# Patient Record
Sex: Male | Born: 1960 | Race: White | Hispanic: No | Marital: Single | Smoking: Former smoker
Health system: Southern US, Community
[De-identification: ages and names within clinical notes are randomized; demographics above are authoritative.]

## PROBLEM LIST (undated history)

## (undated) DIAGNOSIS — F1020 Alcohol dependence, uncomplicated: Secondary | ICD-10-CM

## (undated) DIAGNOSIS — Z87442 Personal history of urinary calculi: Secondary | ICD-10-CM

## (undated) DIAGNOSIS — J45909 Unspecified asthma, uncomplicated: Secondary | ICD-10-CM

## (undated) DIAGNOSIS — F32A Depression, unspecified: Secondary | ICD-10-CM

## (undated) DIAGNOSIS — F419 Anxiety disorder, unspecified: Secondary | ICD-10-CM

## (undated) DIAGNOSIS — N3289 Other specified disorders of bladder: Secondary | ICD-10-CM

## (undated) DIAGNOSIS — T7840XA Allergy, unspecified, initial encounter: Secondary | ICD-10-CM

## (undated) HISTORY — PX: KIDNEY STONE SURGERY: SHX686

## (undated) HISTORY — PX: NASAL SEPTUM SURGERY: SHX37

## (undated) HISTORY — DX: Unspecified asthma, uncomplicated: J45.909

## (undated) HISTORY — DX: Depression, unspecified: F32.A

## (undated) HISTORY — DX: Allergy, unspecified, initial encounter: T78.40XA

## (undated) HISTORY — DX: Anxiety disorder, unspecified: F41.9

## (undated) HISTORY — DX: Other specified disorders of bladder: N32.89

## (undated) HISTORY — PX: HEMORRHOID SURGERY: SHX153

---

## 2012-06-27 ENCOUNTER — Encounter (HOSPITAL_COMMUNITY): Payer: Self-pay | Admitting: *Deleted

## 2012-06-27 ENCOUNTER — Emergency Department (HOSPITAL_COMMUNITY)
Admission: EM | Admit: 2012-06-27 | Discharge: 2012-06-27 | Disposition: A | Discharge status: Home / Self Care | Payer: BC Managed Care – PPO | Attending: Emergency Medicine | Admitting: Emergency Medicine

## 2012-06-27 ENCOUNTER — Emergency Department (HOSPITAL_COMMUNITY): Payer: BC Managed Care – PPO

## 2012-06-27 DIAGNOSIS — N200 Calculus of kidney: Secondary | ICD-10-CM | POA: Insufficient documentation

## 2012-06-27 DIAGNOSIS — Z79899 Other long term (current) drug therapy: Secondary | ICD-10-CM | POA: Insufficient documentation

## 2012-06-27 DIAGNOSIS — R310 Gross hematuria: Secondary | ICD-10-CM | POA: Insufficient documentation

## 2012-06-27 DIAGNOSIS — F1020 Alcohol dependence, uncomplicated: Secondary | ICD-10-CM | POA: Insufficient documentation

## 2012-06-27 HISTORY — DX: Alcohol dependence, uncomplicated: F10.20

## 2012-06-27 LAB — URINE MICROSCOPIC-ADD ON

## 2012-06-27 LAB — CBC WITH DIFFERENTIAL/PLATELET
Eosinophils Absolute: 0.2 10*3/uL (ref 0.0–0.7)
Eosinophils Relative: 2 % (ref 0–5)
HCT: 42.9 % (ref 39.0–52.0)
Hemoglobin: 15 g/dL (ref 13.0–17.0)
Lymphs Abs: 1.8 10*3/uL (ref 0.7–4.0)
MCH: 30.6 pg (ref 26.0–34.0)
MCHC: 35 g/dL (ref 30.0–36.0)
MCV: 87.6 fL (ref 78.0–100.0)
Monocytes Absolute: 0.7 10*3/uL (ref 0.1–1.0)
Monocytes Relative: 9 % (ref 3–12)
Neutrophils Relative %: 63 % (ref 43–77)
RBC: 4.9 MIL/uL (ref 4.22–5.81)

## 2012-06-27 LAB — URINALYSIS, ROUTINE W REFLEX MICROSCOPIC
Bilirubin Urine: NEGATIVE
Glucose, UA: NEGATIVE mg/dL
Ketones, ur: 15 mg/dL — AB
Specific Gravity, Urine: 1.022 (ref 1.005–1.030)
pH: 5.5 (ref 5.0–8.0)

## 2012-06-27 LAB — COMPREHENSIVE METABOLIC PANEL
Alkaline Phosphatase: 73 U/L (ref 39–117)
BUN: 22 mg/dL (ref 6–23)
Creatinine, Ser: 0.91 mg/dL (ref 0.50–1.35)
GFR calc Af Amer: >90 mL/min (ref >90)
Glucose, Bld: 84 mg/dL (ref 70–99)
Potassium: 3.3 mEq/L — ABNORMAL LOW (ref 3.5–5.1)
Total Bilirubin: 0.4 mg/dL (ref 0.3–1.2)
Total Protein: 6.8 g/dL (ref 6.0–8.3)

## 2012-06-27 MED ORDER — NAPROXEN 375 MG PO TABS: 375.0000 mg | ORAL_TABLET | Freq: Two times a day (BID) | ORAL | Status: DC

## 2012-06-27 MED ORDER — HYDROCODONE-ACETAMINOPHEN 5-325 MG PO TABS: 1.0000 | ORAL_TABLET | Freq: Four times a day (QID) | ORAL | Status: DC | PRN

## 2012-06-27 NOTE — ED Notes (Signed)
The pt has had lt flank pain  Since 2100 and when he went home he saw blood in his urine.  No previous history

## 2012-06-27 NOTE — ED Notes (Signed)
Pt transported to CT ?

## 2012-06-27 NOTE — ED Provider Notes (Signed)
History     CSN: [id number]  Arrival date & time 06/27/12  0024   First MD Initiated Contact with Patient 06/27/12 0155      Chief Complaint  Patient presents with  . Flank Pain    (Consider location/radiation/quality/duration/timing/severity/associated sxs/prior treatment) HPI Comments: Wayne Silva is a 52 y.o. male with a history of substance abuse (alcohol) presents emergency department complaining of left-sided flank pain and suprapubic abdominal pressure onset at 830 pm this evening.  Other associated symptoms include gross hematuria that occurred around 9 this evening.  Patient is unable to characterize the pain and states that was previously rated at a 6/10 and is currently rated at a 2/10.  The pain was unrelieved by Gas-X and time this as patient believed to be etiology to be gas.  No known exacerbating factors.  Flank pain and abdominal pain were felt as 2 separate pains and did not feel as though one radiated towards the other.  Patient denies any fevers, night sweats, chills, dysuria, urinary frequency, urinary hesitancy, penile discharge, change in bowel movements, nausea or vomiting.  Patient is a 52 y.o. male presenting with flank pain. The history is provided by the patient.  Flank Pain Associated symptoms include abdominal pain. Pertinent negatives include no chest pain, chills, coughing, diaphoresis, fatigue, fever, headaches, nausea, rash or vomiting.    Past Medical History  Diagnosis Date  . Alcoholic     History reviewed. No pertinent past surgical history.  No family history on file.  History  Substance Use Topics  . Smoking status: Never Smoker   . Smokeless tobacco: Not on file  . Alcohol Use: Yes      Review of Systems  Constitutional: Negative for fever, chills, diaphoresis and fatigue.  HENT: Negative for ear pain and sinus pressure.   Eyes: Negative for visual disturbance.  Respiratory: Negative for cough.   Cardiovascular: Negative for chest  pain.  Gastrointestinal: Positive for abdominal pain. Negative for nausea and vomiting.  Genitourinary: Positive for flank pain. Negative for dysuria, urgency and difficulty urinating.  Skin: Negative for color change and rash.  Neurological: Negative for dizziness and headaches.  Psychiatric/Behavioral: Negative for confusion.  All other systems reviewed and are negative.    Allergies  Review of patient's allergies indicates no known allergies.  Home Medications   Current Outpatient Rx  Name  Route  Sig  Dispense  Refill  . acetaminophen (TYLENOL) 325 MG tablet   Oral   Take 650 mg by mouth every 6 (six) hours as needed for pain.         Marland Kitchen ALPRAZolam (XANAX) 0.25 MG tablet   Oral   Take 0.25 mg by mouth 3 (three) times daily.         . Multiple Vitamin (MULTIVITAMIN WITH MINERALS) TABS   Oral   Take 1 tablet by mouth daily.         . valACYclovir (VALTREX) 500 MG tablet   Oral   Take 500 mg by mouth 2 (two) times daily as needed (for cold sore).           BP 118/79  Pulse 64  Temp(Src) 97.5 F (36.4 C) (Oral)  Resp 18  SpO2 99%  Physical Exam  Nursing note and vitals reviewed. Constitutional: He is oriented to person, place, and time. He appears well-developed and well-nourished. He appears distressed.  HENT:  Head: Normocephalic and atraumatic.  Eyes: Conjunctivae and EOM are normal.  Neck: Normal range of motion. Neck supple.  Cardiovascular: Normal rate, regular rhythm and normal heart sounds.   Pulmonary/Chest: Effort normal.  Abdominal: Soft. Normal appearance and bowel sounds are normal. He exhibits no distension, no ascites and no pulsatile midline mass. There is CVA tenderness (mild left sided).  Soft non tender abdomen with no peritone andal signs. No masses.   Musculoskeletal: Normal range of motion.  Neurological: He is alert and oriented to person, place, and time.  Skin: Skin is warm and dry. He is not diaphoretic.  Psychiatric: He has a  normal mood and affect. His behavior is normal.    ED Course  Procedures (including critical care time)  Labs Reviewed  URINALYSIS, ROUTINE W REFLEX MICROSCOPIC - Abnormal; Notable for the following:    Color, Urine RED (*)    APPearance CLOUDY (*)    Hgb urine dipstick LARGE (*)    Ketones, ur 15 (*)    Protein, ur 30 (*)    Leukocytes, UA SMALL (*)    All other components within normal limits  CBC WITH DIFFERENTIAL - Abnormal; Notable for the following:    Platelets 149 (*)    All other components within normal limits  COMPREHENSIVE METABOLIC PANEL - Abnormal; Notable for the following:    Potassium 3.3 (*)    All other components within normal limits  URINE MICROSCOPIC-ADD ON - Abnormal; Notable for the following:    Squamous Epithelial / LPF FEW (*)    Bacteria, UA FEW (*)    All other components within normal limits  URINE CULTURE   Ct Abdomen Pelvis Wo Contrast  06/27/2012  *RADIOLOGY REPORT*  Clinical Data: Lower abdominal pain and left flank pain. Hematuria.  CT ABDOMEN AND PELVIS WITHOUT CONTRAST  Technique:  Multidetector CT imaging of the abdomen and pelvis was performed following the standard protocol without intravenous contrast.  Comparison: None.  Findings: The visualized lung bases are clear.  The liver and spleen are unremarkable in appearance.  The gallbladder is within normal limits.  The pancreas and adrenal glands are unremarkable.  Minimal left-sided hydronephrosis is noted, with prominence of the proximal left ureter, and an obstructing 5 x 4 mm stone in the proximal left ureter, just below the left renal pelvis. The right kidney is unremarkable in appearance.  No nonobstructing renal stones are identified.  No significant perinephric stranding is seen.  No free fluid is identified.  The small bowel is unremarkable in appearance.  The stomach is within normal limits.  No acute vascular abnormalities are seen.  The appendix is normal in caliber, without evidence for  appendicitis.  The colon is unremarkable in appearance.  The bladder is mildly distended and grossly unremarkable.  The prostate remains normal in size, with minimal calcification.  No inguinal lymphadenopathy is seen.  No acute osseous abnormalities are identified.  There is mild narrowing of the intervertebral disc space at L2-L3, without evidence of significant disc protrusion.  IMPRESSION: Minimal left-sided hydronephrosis, with an obstructing 5 x 4 mm stone in the proximal left ureter, just below the left renal pelvis.   Original Report Authenticated By: Tonia Ghent, M.D.      No diagnosis found.    MDM  Left kidney stone, hematuria  Pt has been diagnosed with a Kidney Stone via CT. There is no evidence of significant hydronephrosis, serum creatine WNL, vitals sign stable and the pt does not have irratractable vomiting. Pt will be dc home with pain medications & has been advised to follow up with PCP.  Jaci Carrel, [place] 06/27/12 0430

## 2012-06-27 NOTE — ED Notes (Signed)
Pt began having lower abdominal pain this evening, took gas-x with little relief.  Upon urination pt noted his urine was blood tinged and dark in color,  Also complaining of left flank pain.  Denies any kidney stones in the past.  Denies N/V.

## 2012-06-27 NOTE — ED Provider Notes (Signed)
Medical screening examination/treatment/procedure(s) were performed by non-physician practitioner and as supervising physician I was immediately available for consultation/collaboration.   Lyanne Co, MD 06/27/12 [phone]

## 2012-06-28 LAB — URINE CULTURE: Colony Count: NO GROWTH

## 2012-12-01 LAB — HM COLONOSCOPY: HM Colonoscopy: NORMAL

## 2013-03-02 ENCOUNTER — Ambulatory Visit (INDEPENDENT_AMBULATORY_CARE_PROVIDER_SITE_OTHER): Payer: BC Managed Care – PPO | Admitting: Internal Medicine

## 2013-03-02 VITALS — BP 128/68 | HR 64 | Temp 97.8°F | Resp 16 | Wt 140.8 lb

## 2013-03-02 DIAGNOSIS — Z23 Encounter for immunization: Secondary | ICD-10-CM

## 2013-03-02 DIAGNOSIS — F411 Generalized anxiety disorder: Secondary | ICD-10-CM

## 2013-03-02 MED ORDER — SERTRALINE HCL 25 MG PO TABS: 25.0000 mg | ORAL_TABLET | Freq: Every day | ORAL | Status: DC

## 2013-03-02 NOTE — Progress Notes (Signed)
Subjective:    Patient ID: Wayne Silva, male    DOB: 04-05-1961, 52 y.o.   MRN: [id number]  HPI Comments: New to me he was sent here by his therapist Lorenda Cahill. 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 His current dose of xanax is down to 0.125 mg once a day. He feels like he still needs the xanax to some degree because he has fits of anger (? Rage), anxiety, irritability, and insomnia.     Review of Systems  Constitutional: Negative.  Negative for fever, chills, diaphoresis, appetite change and fatigue.  HENT: Negative.   Eyes: Negative.   Respiratory: Negative.  Negative for apnea, cough, choking, chest tightness, shortness of breath, wheezing and stridor.   Cardiovascular: Negative.  Negative for chest pain, palpitations and leg swelling.  Gastrointestinal: Negative.  Negative for nausea, vomiting, abdominal pain, diarrhea and constipation.  Endocrine: Negative.   Genitourinary: Negative.   Musculoskeletal: Negative.   Skin: Negative.   Allergic/Immunologic: Negative.   Neurological: Negative.  Negative for dizziness, tremors, seizures, syncope, facial asymmetry, speech difficulty, weakness, light-headedness, numbness and headaches.  Hematological: Negative.  Negative for adenopathy. Does not bruise/bleed easily.  Psychiatric/Behavioral: Positive for sleep disturbance, dysphoric mood and decreased concentration. Negative for suicidal ideas, hallucinations, behavioral problems, confusion, self-injury and agitation. The patient is nervous/anxious. The patient is not hyperactive.        Objective:   Physical Exam  Vitals reviewed. Constitutional: He is oriented to person, place, and time. He appears well-developed and well-nourished. No distress.  HENT:  Head: Normocephalic and atraumatic.  Mouth/Throat: Oropharynx is clear and moist. No oropharyngeal exudate.  Eyes: Conjunctivae  are normal. Right eye exhibits no discharge. Left eye exhibits no discharge. No scleral icterus.  Neck: Normal range of motion. Neck supple. No JVD present. No tracheal deviation present. No thyromegaly present.  Cardiovascular: Normal rate, regular rhythm, normal heart sounds and intact distal pulses.  Exam reveals no gallop and no friction rub.   No murmur heard. Pulmonary/Chest: Effort normal and breath sounds normal. No stridor. No respiratory distress. He has no wheezes. He has no rales. He exhibits no tenderness.  Abdominal: Soft. Bowel sounds are normal. He exhibits no distension and no mass. There is no tenderness. There is no rebound and no guarding.  Musculoskeletal: Normal range of motion. He exhibits no edema and no tenderness.  Lymphadenopathy:    He has no cervical adenopathy.  Neurological: He is oriented to person, place, and time.  Skin: Skin is warm and dry. No rash noted. He is not diaphoretic. No erythema. No pallor.  Psychiatric: His behavior is normal. Judgment and thought content normal. His mood appears anxious. His affect is not angry, not blunt, not labile and not inappropriate. His speech is not rapid and/or pressured, not delayed, not tangential and not slurred. He is not agitated, not aggressive, not hyperactive, not slowed, not withdrawn, not actively hallucinating and not combative. Thought content is not paranoid and not delusional. Cognition and memory are normal. He does not exhibit a depressed mood. He expresses no homicidal and no suicidal ideation. He expresses no suicidal plans and no homicidal plans. He is communicative. He is attentive.     Lab Results  Component Value Date   WBC 7.3 06/27/2012   HGB 15.0 06/27/2012   HCT 42.9 06/27/2012   PLT 149* 06/27/2012   GLUCOSE 84 06/27/2012   ALT  14 06/27/2012   AST 17 06/27/2012   NA 137 06/27/2012   K 3.3* 06/27/2012   CL 102 06/27/2012   CREATININE 0.91 06/27/2012   BUN 22 06/27/2012   CO2 30 06/27/2012        Assessment & Plan:

## 2013-03-02 NOTE — Patient Instructions (Signed)

## 2013-03-07 ENCOUNTER — Encounter: Payer: Self-pay | Admitting: Internal Medicine

## 2013-03-07 NOTE — Assessment & Plan Note (Addendum)
Despite his concerns there is no evidence that he is withdrawing off of the xanax based on his exam today I told him that I think he is ready to stop the xanax all together I am concerned that he may have GAD, depression, OCD so I have asked him to start sertraline He saw a psych recently and was placed on buspar but that has not helped much He will cont intensive therapy with Lorenda Cahill

## 2013-08-26 ENCOUNTER — Encounter: Payer: Self-pay | Admitting: Internal Medicine

## 2013-08-26 ENCOUNTER — Ambulatory Visit (INDEPENDENT_AMBULATORY_CARE_PROVIDER_SITE_OTHER): Payer: BC Managed Care – PPO | Admitting: Internal Medicine

## 2013-08-26 ENCOUNTER — Other Ambulatory Visit (INDEPENDENT_AMBULATORY_CARE_PROVIDER_SITE_OTHER): Payer: BC Managed Care – PPO

## 2013-08-26 VITALS — BP 110/68 | HR 65 | Temp 97.5°F | Resp 16 | Ht 67.0 in | Wt 133.0 lb

## 2013-08-26 DIAGNOSIS — Z0000 Encounter for general adult medical examination without abnormal findings: Secondary | ICD-10-CM | POA: Insufficient documentation

## 2013-08-26 DIAGNOSIS — N3010 Interstitial cystitis (chronic) without hematuria: Secondary | ICD-10-CM

## 2013-08-26 LAB — COMPREHENSIVE METABOLIC PANEL
ALK PHOS: 63 U/L (ref 39–117)
ALT: 42 U/L (ref 0–53)
AST: 26 U/L (ref 0–37)
Albumin: 4.1 g/dL (ref 3.5–5.2)
BILIRUBIN TOTAL: 0.8 mg/dL (ref 0.3–1.2)
BUN: 22 mg/dL (ref 6–23)
CO2: 30 mEq/L (ref 19–32)
CREATININE: 0.9 mg/dL (ref 0.4–1.5)
Calcium: 9.3 mg/dL (ref 8.4–10.5)
Chloride: 102 mEq/L (ref 96–112)
GFR: 95.38 mL/min (ref >60.00)
GLUCOSE: 57 mg/dL — AB (ref 70–99)
POTASSIUM: 4.3 meq/L (ref 3.5–5.1)
Sodium: 138 mEq/L (ref 135–145)
Total Protein: 6.7 g/dL (ref 6.0–8.3)

## 2013-08-26 LAB — CBC WITH DIFFERENTIAL/PLATELET
BASOS PCT: 0.6 % (ref 0.0–3.0)
Basophils Absolute: 0 10*3/uL (ref 0.0–0.1)
EOS ABS: 0.2 10*3/uL (ref 0.0–0.7)
EOS PCT: 2.4 % (ref 0.0–5.0)
HEMATOCRIT: 44.9 % (ref 39.0–52.0)
HEMOGLOBIN: 15.2 g/dL (ref 13.0–17.0)
Lymphocytes Relative: 15.5 % (ref 12.0–46.0)
Lymphs Abs: 1.1 10*3/uL (ref 0.7–4.0)
MCHC: 33.9 g/dL (ref 30.0–36.0)
MCV: 89.8 fl (ref 78.0–100.0)
MONO ABS: 0.6 10*3/uL (ref 0.1–1.0)
Monocytes Relative: 8.1 % (ref 3.0–12.0)
NEUTROS ABS: 5.2 10*3/uL (ref 1.4–7.7)
NEUTROS PCT: 73.4 % (ref 43.0–77.0)
Platelets: 179 10*3/uL (ref 150.0–400.0)
RBC: 4.99 Mil/uL (ref 4.22–5.81)
RDW: 13.9 % (ref 11.5–14.6)
WBC: 7 10*3/uL (ref 4.5–10.5)

## 2013-08-26 LAB — LIPID PANEL
CHOLESTEROL: 166 mg/dL (ref 0–200)
HDL: 64.7 mg/dL (ref >39.00)
LDL CALC: 86 mg/dL (ref 0–99)
TRIGLYCERIDES: 77 mg/dL (ref 0.0–149.0)
Total CHOL/HDL Ratio: 3
VLDL: 15.4 mg/dL (ref 0.0–40.0)

## 2013-08-26 LAB — FECAL OCCULT BLOOD, GUAIAC: FECAL OCCULT BLD: NEGATIVE

## 2013-08-26 LAB — TSH: TSH: 1.62 u[IU]/mL (ref 0.35–5.50)

## 2013-08-26 LAB — PSA: PSA: 1.13 ng/mL (ref 0.10–4.00)

## 2013-08-26 NOTE — Patient Instructions (Signed)

## 2013-08-26 NOTE — Progress Notes (Signed)
Pre visit review using our clinic review tool, if applicable. No additional management support is needed unless otherwise documented below in the visit note. 

## 2013-08-26 NOTE — Assessment & Plan Note (Signed)
Exam done Labs ordered Vaccines were reviewed Pt ed material was given 

## 2013-08-26 NOTE — Progress Notes (Signed)
Subjective:    Patient ID: Wayne SchmidtJohn Silva, male    DOB: 11-Mar-1961, 53 y.o.   MRN: [id number]  HPI Comments: He returns for a physical and he tells me that he feels well and offer no complaints. He has been seeing a urologist in [place] for the treatment of IC.     Review of Systems  Constitutional: Negative.  Negative for fever, chills, diaphoresis, appetite change and fatigue.  HENT: Negative.   Eyes: Negative.   Respiratory: Negative.  Negative for cough, choking, chest tightness, shortness of breath and stridor.   Cardiovascular: Negative.  Negative for chest pain, palpitations and leg swelling.  Gastrointestinal: Negative.  Negative for abdominal pain.  Endocrine: Negative.   Genitourinary: Negative.  Negative for difficulty urinating.  Musculoskeletal: Negative.   Skin: Negative.   Allergic/Immunologic: Negative.   Neurological: Negative.   Hematological: Negative.  Negative for adenopathy. Does not bruise/bleed easily.  Psychiatric/Behavioral: Negative for suicidal ideas, sleep disturbance, self-injury, dysphoric mood and agitation. The patient is nervous/anxious.   All other systems reviewed and are negative.      Objective:   Physical Exam  Vitals reviewed. Constitutional: He is oriented to person, place, and time. He appears well-developed and well-nourished. No distress.  HENT:  Head: Normocephalic and atraumatic.  Mouth/Throat: Oropharynx is clear and moist. No oropharyngeal exudate.  Eyes: Conjunctivae are normal. Right eye exhibits no discharge. Left eye exhibits no discharge. No scleral icterus.  Neck: Normal range of motion. Neck supple. No JVD present. No tracheal deviation present. No thyromegaly present.  Cardiovascular: Normal rate, regular rhythm, normal heart sounds and intact distal pulses.  Exam reveals no gallop and no friction rub.   No murmur heard. Pulmonary/Chest: Effort normal and breath sounds normal. No stridor. No respiratory distress. He has no  wheezes. He has no rales. He exhibits no tenderness.  Abdominal: Soft. Bowel sounds are normal. He exhibits no distension and no mass. There is no tenderness. There is no rebound and no guarding. Hernia confirmed negative in the right inguinal area and confirmed negative in the left inguinal area.  Genitourinary: Rectum normal, prostate normal, testes normal and penis normal. Rectal exam shows no external hemorrhoid, no internal hemorrhoid, no fissure, no mass, no tenderness and anal tone normal. Guaiac negative stool. Prostate is not enlarged and not tender. Right testis shows no mass, no swelling and no tenderness. Right testis is descended. Left testis shows no mass, no swelling and no tenderness. Left testis is descended. Circumcised. No penile erythema or penile tenderness. No discharge found.  Musculoskeletal: Normal range of motion. He exhibits no edema and no tenderness.  Lymphadenopathy:    He has no cervical adenopathy.       Right: No inguinal adenopathy present.       Left: No inguinal adenopathy present.  Neurological: He is oriented to person, place, and time.  Skin: Skin is warm and dry. No rash noted. He is not diaphoretic. No erythema. No pallor.  Psychiatric: He has a normal mood and affect. His behavior is normal. Judgment and thought content normal.     Lab Results  Component Value Date   WBC 7.3 06/27/2012   HGB 15.0 06/27/2012   HCT 42.9 06/27/2012   PLT 149* 06/27/2012   GLUCOSE 84 06/27/2012   ALT 14 06/27/2012   AST 17 06/27/2012   NA 137 06/27/2012   K 3.3* 06/27/2012   CL 102 06/27/2012   CREATININE 0.91 06/27/2012   BUN 22 06/27/2012   CO2  30 06/27/2012       Assessment & Plan:

## 2014-01-18 ENCOUNTER — Other Ambulatory Visit: Payer: Self-pay | Admitting: Internal Medicine

## 2014-05-01 ENCOUNTER — Encounter: Payer: Self-pay | Admitting: Internal Medicine

## 2014-05-01 ENCOUNTER — Ambulatory Visit (INDEPENDENT_AMBULATORY_CARE_PROVIDER_SITE_OTHER)
Admission: RE | Admit: 2014-05-01 | Discharge: 2014-05-01 | Disposition: A | Discharge status: Home / Self Care | Payer: BC Managed Care – PPO | Source: Ambulatory Visit | Attending: Internal Medicine | Admitting: Internal Medicine

## 2014-05-01 ENCOUNTER — Ambulatory Visit (INDEPENDENT_AMBULATORY_CARE_PROVIDER_SITE_OTHER): Payer: BC Managed Care – PPO | Admitting: Internal Medicine

## 2014-05-01 VITALS — BP 110/82 | HR 52 | Temp 97.5°F | Resp 16 | Ht 67.0 in | Wt 133.0 lb

## 2014-05-01 DIAGNOSIS — R059 Cough, unspecified: Secondary | ICD-10-CM

## 2014-05-01 DIAGNOSIS — R05 Cough: Secondary | ICD-10-CM

## 2014-05-01 DIAGNOSIS — J45991 Cough variant asthma: Secondary | ICD-10-CM | POA: Insufficient documentation

## 2014-05-01 DIAGNOSIS — G3184 Mild cognitive impairment, so stated: Secondary | ICD-10-CM | POA: Insufficient documentation

## 2014-05-01 DIAGNOSIS — F411 Generalized anxiety disorder: Secondary | ICD-10-CM

## 2014-05-01 MED ORDER — SERTRALINE HCL 25 MG PO TABS: 25.0000 mg | ORAL_TABLET | Freq: Every day | ORAL | Status: DC

## 2014-05-01 MED ORDER — PHOSPHATIDYLSERINE-DHA-EPA 100-19.5-6.5 MG PO CAPS: 1.0000 | ORAL_CAPSULE | Freq: Every day | ORAL | Status: DC

## 2014-05-01 MED ORDER — MONTELUKAST SODIUM 10 MG PO TABS: 10.0000 mg | ORAL_TABLET | Freq: Every day | ORAL | Status: DC

## 2014-05-01 MED ORDER — MOMETASONE FUROATE 100 MCG/ACT IN AERO: 1.0000 | INHALATION_SPRAY | Freq: Two times a day (BID) | RESPIRATORY_TRACT | Status: DC

## 2014-05-01 NOTE — Assessment & Plan Note (Signed)
He has tried several OTC products for this with no success and some side effects Will start vayacog

## 2014-05-01 NOTE — Assessment & Plan Note (Signed)
His exam and CXR are normal Will treat for asthma, may consider PFT's and/or a CT scan if he does not improve much with this

## 2014-05-01 NOTE — Assessment & Plan Note (Signed)
He does not want to make an increase in the dose, will cont the same for now

## 2014-05-01 NOTE — Patient Instructions (Signed)
Cough, Adult  A cough is a reflex that helps clear your throat and airways. It can help heal the body or may be a reaction to an irritated airway. A cough may only last 2 or 3 weeks (acute) or may last more than 8 weeks (chronic).  CAUSES Acute cough:  Viral or bacterial infections. Chronic cough:  Infections.  Allergies.  Asthma.  Post-nasal drip.  Smoking.  Heartburn or acid reflux.  Some medicines.  Chronic lung problems (COPD).  Cancer. SYMPTOMS   Cough.  Fever.  Chest pain.  Increased breathing rate.  High-pitched whistling sound when breathing (wheezing).  Colored mucus that you cough up (sputum). TREATMENT   A bacterial cough may be treated with antibiotic medicine.  A viral cough must run its course and will not respond to antibiotics.  Your caregiver may recommend other treatments if you have a chronic cough. HOME CARE INSTRUCTIONS   Only take over-the-counter or prescription medicines for pain, discomfort, or fever as directed by your caregiver. Use cough suppressants only as directed by your caregiver.  Use a cold steam vaporizer or humidifier in your bedroom or home to help loosen secretions.  Sleep in a semi-upright position if your cough is worse at night.  Rest as needed.  Stop smoking if you smoke. SEEK IMMEDIATE MEDICAL CARE IF:   You have pus in your sputum.  Your cough starts to worsen.  You cannot control your cough with suppressants and are losing sleep.  You begin coughing up blood.  You have difficulty breathing.  You develop pain which is getting worse or is uncontrolled with medicine.  You have a fever. MAKE SURE YOU:   Understand these instructions.  Will watch your condition.  Will get help right away if you are not doing well or get worse. Document Released: 10/25/2010 Document Revised: 07/21/2011 Document Reviewed: 10/25/2010 [employer] Patient Information 2015 [employer]. This information is not intended  to replace advice given to you by your health care provider. Make sure you discuss any questions you have with your health care provider.  

## 2014-05-01 NOTE — Assessment & Plan Note (Signed)
Will try singulair and asmanex for this

## 2014-05-01 NOTE — Progress Notes (Signed)
Subjective:    Patient ID: Wayne Silva, male    DOB: [birth year], 53 y.o.   MRN: [id number]  Cough This is a chronic problem. The current episode started more than 1 month ago (for 3 months). The problem has been unchanged. The cough is non-productive. Associated symptoms include postnasal drip and rhinorrhea. Pertinent negatives include no chest pain, chills, ear congestion, ear pain, fever, heartburn, hemoptysis, myalgias, nasal congestion, rash, sore throat, shortness of breath, sweats, weight loss or wheezing. Nothing aggravates the symptoms. He has tried nothing for the symptoms. The treatment provided no relief. His past medical history is significant for environmental allergies. There is no history of asthma, bronchiectasis, bronchitis, COPD, emphysema or pneumonia.      Review of Systems  Constitutional: Negative.  Negative for fever, chills, weight loss, diaphoresis, appetite change and fatigue.  HENT: Positive for postnasal drip and rhinorrhea. Negative for congestion, ear pain, nosebleeds, sinus pressure, sore throat, tinnitus, trouble swallowing and voice change.   Eyes: Negative.   Respiratory: Positive for cough. Negative for apnea, hemoptysis, choking, chest tightness, shortness of breath, wheezing and stridor.   Cardiovascular: Negative.  Negative for chest pain, palpitations and leg swelling.  Gastrointestinal: Negative.  Negative for heartburn, nausea, vomiting, abdominal pain, diarrhea, constipation and blood in stool.  Endocrine: Negative.   Genitourinary: Negative.   Musculoskeletal: Negative.  Negative for myalgias.  Skin: Negative.  Negative for color change and rash.  Allergic/Immunologic: Positive for environmental allergies.  Neurological: Negative.   Hematological: Negative.  Negative for adenopathy. Does not bruise/bleed easily.  Psychiatric/Behavioral: Positive for dysphoric mood and decreased concentration. Negative for suicidal ideas, hallucinations, behavioral  problems, confusion, sleep disturbance, self-injury and agitation. The patient is nervous/anxious. The patient is not hyperactive.        Objective:   Physical Exam  Constitutional: He is oriented to person, place, and time. He appears well-developed and well-nourished.  Non-toxic appearance. He does not have a sickly appearance. He does not appear ill. No distress.  HENT:  Head: Normocephalic and atraumatic.  Mouth/Throat: Oropharynx is clear and moist. No oropharyngeal exudate.  Eyes: Conjunctivae are normal. Right eye exhibits no discharge. Left eye exhibits no discharge. No scleral icterus.  Neck: Normal range of motion. Neck supple. No JVD present. No tracheal deviation present. No thyromegaly present.  Cardiovascular: Normal rate, regular rhythm, normal heart sounds and intact distal pulses.  Exam reveals no gallop.   No murmur heard. Pulmonary/Chest: Effort normal and breath sounds normal. No stridor. No respiratory distress. He has no wheezes. He has no rales. He exhibits no tenderness.  Abdominal: Soft. Bowel sounds are normal. He exhibits no distension and no mass. There is no tenderness. There is no rebound and no guarding.  Musculoskeletal: Normal range of motion. He exhibits no edema or tenderness.  Lymphadenopathy:    He has no cervical adenopathy.  Neurological: He is oriented to person, place, and time.  Skin: Skin is warm and dry. No rash noted. He is not diaphoretic. No erythema. No pallor.  Psychiatric: His behavior is normal. Judgment and thought content normal. His mood appears anxious. His affect is not angry, not blunt, not labile and not inappropriate. His speech is not rapid and/or pressured. He is not agitated, not slowed and not withdrawn. Thought content is not paranoid and not delusional. Cognition and memory are normal. Cognition and memory are not impaired. He does not exhibit a depressed mood. He expresses no homicidal and no suicidal ideation. He expresses no  suicidal  plans and no homicidal plans. He exhibits normal recent memory and normal remote memory. He is attentive.  Vitals reviewed.    Lab Results  Component Value Date   WBC 7.0 08/26/2013   HGB 15.2 08/26/2013   HCT 44.9 08/26/2013   PLT 179.0 08/26/2013   GLUCOSE 57* 08/26/2013   CHOL 166 08/26/2013   TRIG 77.0 08/26/2013   HDL 64.70 08/26/2013   LDLCALC 86 08/26/2013   ALT 42 08/26/2013   AST 26 08/26/2013   NA 138 08/26/2013   K 4.3 08/26/2013   CL 102 08/26/2013   CREATININE 0.9 08/26/2013   BUN 22 08/26/2013   CO2 30 08/26/2013   TSH 1.62 08/26/2013   PSA 1.13 08/26/2013       Assessment & Plan:

## 2014-07-06 ENCOUNTER — Encounter: Payer: Self-pay | Admitting: Internal Medicine

## 2014-07-06 ENCOUNTER — Ambulatory Visit (INDEPENDENT_AMBULATORY_CARE_PROVIDER_SITE_OTHER): Payer: BLUE CROSS/BLUE SHIELD | Admitting: Internal Medicine

## 2014-07-06 VITALS — BP 112/76 | HR 69 | Temp 97.9°F | Ht 67.0 in | Wt 132.0 lb

## 2014-07-06 DIAGNOSIS — Z23 Encounter for immunization: Secondary | ICD-10-CM

## 2014-07-06 DIAGNOSIS — G3184 Mild cognitive impairment, so stated: Secondary | ICD-10-CM

## 2014-07-06 DIAGNOSIS — Z0000 Encounter for general adult medical examination without abnormal findings: Secondary | ICD-10-CM

## 2014-07-06 DIAGNOSIS — J45991 Cough variant asthma: Secondary | ICD-10-CM

## 2014-07-06 MED ORDER — MONTELUKAST SODIUM 10 MG PO TABS: 10.0000 mg | ORAL_TABLET | Freq: Every day | ORAL | Status: DC

## 2014-07-06 MED ORDER — ALBUTEROL SULFATE HFA 108 (90 BASE) MCG/ACT IN AERS: 1.0000 | INHALATION_SPRAY | Freq: Four times a day (QID) | RESPIRATORY_TRACT | Status: DC | PRN

## 2014-07-06 MED ORDER — MOMETASONE FUROATE 100 MCG/ACT IN AERO: 1.0000 | INHALATION_SPRAY | Freq: Two times a day (BID) | RESPIRATORY_TRACT | Status: DC

## 2014-07-06 NOTE — Assessment & Plan Note (Signed)
He has some symptoms after exercise, will start albuterol inhaler for this

## 2014-07-06 NOTE — Patient Instructions (Signed)

## 2014-07-06 NOTE — Progress Notes (Signed)
Pre visit review using our clinic review tool, if applicable. No additional management support is needed unless otherwise documented below in the visit note. 

## 2014-07-06 NOTE — Progress Notes (Signed)
Subjective:    Patient ID: Wayne SchmidtJohn Gilkey, male    DOB: 05-Aug-1960, 54 y.o.   MRN: [id number]  Asthma He complains of chest tightness (after exercise). There is no cough, difficulty breathing, frequent throat clearing, hemoptysis, hoarse voice, shortness of breath, sputum production or wheezing. This is a recurrent problem. The current episode started more than 1 month ago. The problem occurs intermittently. The problem has been unchanged. Pertinent negatives include no appetite change, chest pain, dyspnea on exertion, ear congestion, ear pain, fever, headaches, heartburn, malaise/fatigue, myalgias, nasal congestion, orthopnea, PND, postnasal drip, rhinorrhea, sneezing, sore throat, sweats, trouble swallowing or weight loss. His symptoms are aggravated by exercise. His symptoms are alleviated by steroid inhaler. He reports moderate improvement on treatment. His past medical history is significant for asthma.      Review of Systems  Constitutional: Negative.  Negative for fever, chills, weight loss, malaise/fatigue, diaphoresis, appetite change and fatigue.  HENT: Negative.  Negative for ear pain, hoarse voice, postnasal drip, rhinorrhea, sneezing, sore throat and trouble swallowing.   Eyes: Negative.   Respiratory: Negative.  Negative for cough, hemoptysis, sputum production, shortness of breath and wheezing.   Cardiovascular: Negative.  Negative for chest pain, dyspnea on exertion, palpitations, leg swelling and PND.  Gastrointestinal: Negative.  Negative for heartburn, nausea, vomiting, abdominal pain, diarrhea, constipation and blood in stool.  Endocrine: Negative.   Genitourinary: Negative.   Musculoskeletal: Negative.  Negative for myalgias.  Skin: Negative.   Allergic/Immunologic: Negative.   Neurological: Negative.  Negative for headaches.  Hematological: Negative.  Negative for adenopathy. Does not bruise/bleed easily.  Psychiatric/Behavioral: Positive for decreased concentration.  Negative for suicidal ideas, hallucinations, behavioral problems, confusion, sleep disturbance, self-injury, dysphoric mood and agitation. The patient is nervous/anxious. The patient is not hyperactive.        Objective:   Physical Exam  Constitutional: He is oriented to person, place, and time. He appears well-developed and well-nourished. No distress.  HENT:  Head: Normocephalic and atraumatic.  Mouth/Throat: Oropharynx is clear and moist. No oropharyngeal exudate.  Eyes: Conjunctivae are normal. Right eye exhibits no discharge. Left eye exhibits no discharge. No scleral icterus.  Neck: Normal range of motion. Neck supple. No JVD present. No tracheal deviation present. No thyromegaly present.  Cardiovascular: Normal rate, regular rhythm, normal heart sounds and intact distal pulses.  Exam reveals no gallop and no friction rub.   No murmur heard. Pulmonary/Chest: Effort normal and breath sounds normal. No stridor. No respiratory distress. He has no wheezes. He has no rales. He exhibits no tenderness.  Abdominal: Soft. Bowel sounds are normal. He exhibits no distension and no mass. There is no tenderness. There is no rebound and no guarding.  Musculoskeletal: Normal range of motion. He exhibits no edema or tenderness.  Lymphadenopathy:    He has no cervical adenopathy.  Neurological: He is oriented to person, place, and time.  Skin: Skin is warm and dry. No rash noted. He is not diaphoretic. No erythema. No pallor.  Vitals reviewed.    Lab Results  Component Value Date   WBC 7.0 08/26/2013   HGB 15.2 08/26/2013   HCT 44.9 08/26/2013   PLT 179.0 08/26/2013   GLUCOSE 57* 08/26/2013   CHOL 166 08/26/2013   TRIG 77.0 08/26/2013   HDL 64.70 08/26/2013   LDLCALC 86 08/26/2013   ALT 42 08/26/2013   AST 26 08/26/2013   NA 138 08/26/2013   K 4.3 08/26/2013   CL 102 08/26/2013   CREATININE 0.9 08/26/2013  BUN 22 08/26/2013   CO2 30 08/26/2013   TSH 1.62 08/26/2013   PSA 1.13  08/26/2013       Assessment & Plan:

## 2014-07-06 NOTE — Assessment & Plan Note (Signed)
He cont to complain of memory loss and forgetfulness He wants to see neurology

## 2014-08-03 ENCOUNTER — Ambulatory Visit (INDEPENDENT_AMBULATORY_CARE_PROVIDER_SITE_OTHER): Payer: BLUE CROSS/BLUE SHIELD | Admitting: Internal Medicine

## 2014-08-03 ENCOUNTER — Encounter: Payer: Self-pay | Admitting: Internal Medicine

## 2014-08-03 VITALS — BP 116/70 | HR 64 | Temp 97.8°F | Resp 16 | Ht 67.0 in | Wt 135.0 lb

## 2014-08-03 DIAGNOSIS — J45991 Cough variant asthma: Secondary | ICD-10-CM | POA: Diagnosis not present

## 2014-08-03 DIAGNOSIS — Z23 Encounter for immunization: Secondary | ICD-10-CM | POA: Diagnosis not present

## 2014-08-03 NOTE — Progress Notes (Signed)
Pre visit review using our clinic review tool, if applicable. No additional management support is needed unless otherwise documented below in the visit note. 

## 2014-08-03 NOTE — Patient Instructions (Signed)

## 2014-08-03 NOTE — Progress Notes (Signed)
   Subjective:    Patient ID: Wayne SchmidtJohn Silva, male    DOB: 1960/07/12, 54 y.o.   MRN: [id number]  Asthma There is no chest tightness, cough, difficulty breathing, frequent throat clearing, hemoptysis, hoarse voice, shortness of breath, sputum production or wheezing. This is a chronic problem. The current episode started more than 1 year ago. The problem has been unchanged. Pertinent negatives include no appetite change, chest pain, dyspnea on exertion, ear pain, fever, heartburn, malaise/fatigue, myalgias, nasal congestion, orthopnea, PND, postnasal drip, rhinorrhea, sore throat, sweats, trouble swallowing or weight loss. His symptoms are alleviated by beta-agonist and steroid inhaler. He reports complete improvement on treatment. His past medical history is significant for asthma.      Review of Systems  Constitutional: Negative for fever, chills, weight loss, malaise/fatigue, diaphoresis, appetite change and fatigue.  HENT: Negative.  Negative for ear pain, hoarse voice, postnasal drip, rhinorrhea, sore throat and trouble swallowing.   Eyes: Negative.   Respiratory: Negative.  Negative for cough, hemoptysis, sputum production, shortness of breath and wheezing.   Cardiovascular: Negative.  Negative for chest pain, dyspnea on exertion, palpitations, leg swelling and PND.  Gastrointestinal: Negative.  Negative for heartburn and abdominal pain.  Endocrine: Negative.   Genitourinary: Negative.   Musculoskeletal: Negative.  Negative for myalgias.  Skin: Negative.   Allergic/Immunologic: Negative.   Neurological: Negative.   Hematological: Negative.  Negative for adenopathy.  Psychiatric/Behavioral: Negative.        Objective:   Physical Exam  Constitutional: He is oriented to person, place, and time. He appears well-developed and well-nourished. No distress.  HENT:  Head: Normocephalic and atraumatic.  Mouth/Throat: Oropharynx is clear and moist. No oropharyngeal exudate.  Eyes:  Conjunctivae are normal. Right eye exhibits no discharge. Left eye exhibits no discharge. No scleral icterus.  Neck: Normal range of motion. Neck supple. No JVD present. No tracheal deviation present. No thyromegaly present.  Cardiovascular: Normal rate, regular rhythm, normal heart sounds and intact distal pulses.  Exam reveals no gallop and no friction rub.   No murmur heard. Pulmonary/Chest: Effort normal and breath sounds normal. No stridor. No respiratory distress. He has no wheezes. He has no rales. He exhibits no tenderness.  Abdominal: Soft. Bowel sounds are normal. He exhibits no distension and no mass. There is no tenderness. There is no rebound and no guarding.  Musculoskeletal: Normal range of motion. He exhibits no edema.  Lymphadenopathy:    He has no cervical adenopathy.  Neurological: He is oriented to person, place, and time.  Skin: Skin is warm and dry. No rash noted. He is not diaphoretic. No erythema. No pallor.  Vitals reviewed.         Assessment & Plan:

## 2014-08-03 NOTE — Assessment & Plan Note (Signed)
He is doing well on the ICS/SABA combo, will continue

## 2014-08-16 ENCOUNTER — Ambulatory Visit: Payer: BLUE CROSS/BLUE SHIELD | Admitting: Neurology

## 2014-08-23 ENCOUNTER — Encounter: Payer: Self-pay | Admitting: Neurology

## 2014-08-23 ENCOUNTER — Ambulatory Visit (INDEPENDENT_AMBULATORY_CARE_PROVIDER_SITE_OTHER): Payer: BLUE CROSS/BLUE SHIELD | Admitting: Neurology

## 2014-08-23 VITALS — BP 90/62 | HR 64 | Ht 67.0 in | Wt 140.0 lb

## 2014-08-23 DIAGNOSIS — F411 Generalized anxiety disorder: Secondary | ICD-10-CM | POA: Diagnosis not present

## 2014-08-23 DIAGNOSIS — R413 Other amnesia: Secondary | ICD-10-CM | POA: Insufficient documentation

## 2014-08-23 LAB — TSH: TSH: 1.952 u[IU]/mL (ref 0.350–4.500)

## 2014-08-23 LAB — VITAMIN B12: Vitamin B-12: 1078 pg/mL — ABNORMAL HIGH (ref 211–911)
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
## 2014-08-23 NOTE — Patient Instructions (Addendum)
1. Bloodwork for TSH, B12, RPR 2. Schedule MRI brain without contrast 3. Refer for Neuropsychological evaluation at [facility] 4. Follow-up in 2 months

## 2014-08-24 LAB — RPR

## 2014-09-01 ENCOUNTER — Ambulatory Visit (HOSPITAL_COMMUNITY): Admission: RE | Admit: 2014-09-01 | Payer: BLUE CROSS/BLUE SHIELD | Source: Ambulatory Visit

## 2014-10-03 ENCOUNTER — Ambulatory Visit (HOSPITAL_COMMUNITY)
Admission: RE | Admit: 2014-10-03 | Discharge: 2014-10-03 | Disposition: A | Discharge status: Home / Self Care | Payer: BLUE CROSS/BLUE SHIELD | Source: Ambulatory Visit | Attending: Neurology | Admitting: Neurology

## 2014-10-03 ENCOUNTER — Other Ambulatory Visit (HOSPITAL_COMMUNITY): Payer: BLUE CROSS/BLUE SHIELD

## 2014-10-03 DIAGNOSIS — R413 Other amnesia: Secondary | ICD-10-CM | POA: Diagnosis not present

## 2014-10-31 ENCOUNTER — Encounter: Payer: Self-pay | Admitting: Internal Medicine

## 2014-10-31 ENCOUNTER — Ambulatory Visit (INDEPENDENT_AMBULATORY_CARE_PROVIDER_SITE_OTHER): Payer: BLUE CROSS/BLUE SHIELD | Admitting: Internal Medicine

## 2014-10-31 VITALS — BP 100/64 | HR 66 | Temp 98.1°F | Resp 16 | Ht 67.0 in | Wt 132.0 lb

## 2014-10-31 DIAGNOSIS — J45991 Cough variant asthma: Secondary | ICD-10-CM | POA: Diagnosis not present

## 2014-10-31 DIAGNOSIS — T148 Other injury of unspecified body region: Secondary | ICD-10-CM | POA: Diagnosis not present

## 2014-10-31 DIAGNOSIS — W57XXXA Bitten or stung by nonvenomous insect and other nonvenomous arthropods, initial encounter: Secondary | ICD-10-CM | POA: Diagnosis not present

## 2014-10-31 DIAGNOSIS — F411 Generalized anxiety disorder: Secondary | ICD-10-CM | POA: Diagnosis not present

## 2014-10-31 MED ORDER — MONTELUKAST SODIUM 10 MG PO TABS: 10.0000 mg | ORAL_TABLET | Freq: Every day | ORAL | Status: DC

## 2014-10-31 MED ORDER — TRIAMCINOLONE ACETONIDE 0.5 % EX CREA: 1.0000 "application " | TOPICAL_CREAM | Freq: Three times a day (TID) | CUTANEOUS | Status: DC

## 2014-10-31 MED ORDER — SERTRALINE HCL 25 MG PO TABS: 25.0000 mg | ORAL_TABLET | Freq: Every day | ORAL | Status: DC

## 2014-10-31 NOTE — Patient Instructions (Signed)

## 2014-10-31 NOTE — Progress Notes (Signed)
Pre visit review using our clinic review tool, if applicable. No additional management support is needed unless otherwise documented below in the visit note. 

## 2014-10-31 NOTE — Progress Notes (Signed)
Subjective:  Patient ID: Wayne Silva, male    DOB: May 29, 1960  Age: 54 y.o. MRN: [id number]  CC: Rash   HPI Wayne Silva presents for a rash - he has been in the [employer] and sustained a few mosquito bites on his thighs that feel red, swollen, itchy when he is exposed to heat. He has not treated the lesions. They have been present for several days and are slowly getting better.  Outpatient Prescriptions Prior to Visit  Medication Sig Dispense Refill  . albuterol (VENTOLIN HFA) 108 (90 BASE) MCG/ACT inhaler Inhale 1-2 puffs into the lungs every 6 (six) hours as needed for wheezing or shortness of breath. 18 g 11  . Mometasone Furoate (ASMANEX HFA) 100 MCG/ACT AERO Inhale 1 Act into the lungs 2 times daily at 12 noon and 4 pm. 13 g 11  . Multiple Vitamin (MULTIVITAMIN WITH MINERALS) TABS Take 1 tablet by mouth daily.    . Omega-3 Fatty Acids (FISH OIL PO) Take by mouth daily.    . pentosan polysulfate (ELMIRON) 100 MG capsule Take 1 capsule (100 mg total) by mouth 3 (three) times daily. 90 capsule 11  . valACYclovir (VALTREX) 1000 MG tablet Take 1 tablet by mouth daily.    . montelukast (SINGULAIR) 10 MG tablet Take 1 tablet (10 mg total) by mouth at bedtime. 90 tablet 1  . sertraline (ZOLOFT) 25 MG tablet Take 1 tablet (25 mg total) by mouth daily. 90 tablet 3   No facility-administered medications prior to visit.    ROS Review of Systems  Constitutional: Negative.  Negative for fever, chills, diaphoresis, appetite change and fatigue.  HENT: Negative.   Eyes: Negative.   Respiratory: Negative.  Negative for cough, choking, chest tightness, shortness of breath, wheezing and stridor.   Cardiovascular: Negative.  Negative for chest pain, palpitations and leg swelling.  Gastrointestinal: Negative.  Negative for nausea, vomiting, abdominal pain, diarrhea, constipation and blood in stool.  Endocrine: Negative.   Genitourinary: Negative.  Negative for dysuria, hematuria, flank pain and  difficulty urinating.  Musculoskeletal: Negative.   Skin: Positive for rash. Negative for color change, pallor and wound.  Allergic/Immunologic: Negative.   Neurological: Negative.  Negative for dizziness, tremors, syncope, light-headedness, numbness and headaches.  Hematological: Negative.  Negative for adenopathy. Does not bruise/bleed easily.  Psychiatric/Behavioral: Negative.     Objective:  BP 100/64 mmHg  Pulse 66  Temp(Src) 98.1 F (36.7 C) (Oral)  Resp 16  Ht  (1.702 m)  Wt 132 lb (59.875 kg)  BMI 20.67 kg/m2  SpO2 98%  BP Readings from Last 3 Encounters:  10/31/14 100/64  08/23/14 90/62  08/03/14 116/70    Wt Readings from Last 3 Encounters:  10/31/14 132 lb (59.875 kg)  08/23/14 140 lb (63.504 kg)  08/03/14 135 lb (61.236 kg)    Physical Exam  Constitutional: He is oriented to person, place, and time. He appears well-developed and well-nourished. No distress.  HENT:  Head: Normocephalic and atraumatic.  Mouth/Throat: Oropharynx is clear and moist. No oropharyngeal exudate.  Eyes: Conjunctivae are normal. Right eye exhibits no discharge. Left eye exhibits no discharge. No scleral icterus.  Neck: Normal range of motion. Neck supple. No JVD present. No tracheal deviation present. No thyromegaly present.  Cardiovascular: Normal rate, regular rhythm, normal heart sounds and intact distal pulses.  Exam reveals no gallop and no friction rub.   No murmur heard. Pulmonary/Chest: Effort normal and breath sounds normal. No stridor. No respiratory distress. He has no wheezes.  He has no rales. He exhibits no tenderness.  Abdominal: Soft. Bowel sounds are normal. He exhibits no distension and no mass. There is no tenderness. There is no rebound and no guarding.  Musculoskeletal: Normal range of motion. He exhibits no edema or tenderness.  Lymphadenopathy:    He has no cervical adenopathy.  Neurological: He is oriented to person, place, and time.  Skin: Skin is warm,  dry and intact. Rash noted. No abrasion, no bruising, no burn, no ecchymosis, no laceration, no lesion, no petechiae and no purpura noted. Rash is papular. Rash is not macular, not maculopapular, not nodular, not pustular, not vesicular and not urticarial. He is not diaphoretic. No cyanosis or erythema. No pallor. Nails show no clubbing.     Psychiatric: He has a normal mood and affect. His behavior is normal. Judgment and thought content normal.  Vitals reviewed.   Lab Results  Component Value Date   WBC 7.0 08/26/2013   HGB 15.2 08/26/2013   HCT 44.9 08/26/2013   PLT 179.0 08/26/2013   GLUCOSE 57* 08/26/2013   CHOL 166 08/26/2013   TRIG 77.0 08/26/2013   HDL 64.70 08/26/2013   LDLCALC 86 08/26/2013   ALT 42 08/26/2013   AST 26 08/26/2013   NA 138 08/26/2013   K 4.3 08/26/2013   CL 102 08/26/2013   CREATININE 0.9 08/26/2013   BUN 22 08/26/2013   CO2 30 08/26/2013   TSH 1.952 08/23/2014   PSA 1.13 08/26/2013    [name] Contrast  10/03/2014   CLINICAL DATA:  54 year old male with memory loss ongoing for 5 years. Initial encounter.  EXAM: MRI HEAD WITHOUT CONTRAST  TECHNIQUE: Multiplanar, multiecho pulse sequences of the brain and surrounding structures were obtained without intravenous contrast.  COMPARISON:  None.  FINDINGS: No acute infarct.  No intracranial hemorrhage.  No hydrocephalus or advanced atrophy.  No intracranial mass lesion noted on this unenhanced exam.  Major intracranial vascular structures are patent.  Partial opacification right mastoid air cells without obstructing lesion of the eustachian tube noted.  Minimal mucosal thickening ethmoid sinus air cells and maxillary sinuses.  Cervical medullary junction, pituitary region, pineal region and orbital structures unremarkable.  IMPRESSION: Partial opacification right mastoid air cells otherwise negative unenhanced MR brain as noted above.   Electronically Signed   By: Lacy Duverney M.D.   On: 10/03/2014 14:04     Assessment & Plan:   Wayne Silva was seen today for rash.  Diagnoses and all orders for this visit:  Bug bites - will treat with topical steroids Orders: -     triamcinolone cream (KENALOG) 0.5 %; Apply 1 application topically 3 (three) times daily.  Cough variant asthma Orders: -     montelukast (SINGULAIR) 10 MG tablet; Take 1 tablet (10 mg total) by mouth at bedtime.  GAD (generalized anxiety disorder) Orders: -     sertraline (ZOLOFT) 25 MG tablet; Take 1 tablet (25 mg total) by mouth daily.   I am having Wayne Silva start on triamcinolone cream. I am also having him maintain his multivitamin with minerals, pentosan polysulfate, valACYclovir, albuterol, Mometasone Furoate, Omega-3 Fatty Acids (FISH OIL PO), montelukast, and sertraline.  Meds ordered this encounter  Medications  . triamcinolone cream (KENALOG) 0.5 %    Sig: Apply 1 application topically 3 (three) times daily.    Dispense:  30 g    Refill:  1  . montelukast (SINGULAIR) 10 MG tablet    Sig: Take 1 tablet (10 mg total)  by mouth at bedtime.    Dispense:  90 tablet    Refill:  3  . sertraline (ZOLOFT) 25 MG tablet    Sig: Take 1 tablet (25 mg total) by mouth daily.    Dispense:  90 tablet    Refill:  3     Follow-up: Return if symptoms worsen or fail to improve.  Sanda Linger, MD

## 2014-11-08 ENCOUNTER — Encounter: Payer: Self-pay | Admitting: Neurology

## 2014-11-08 ENCOUNTER — Ambulatory Visit (INDEPENDENT_AMBULATORY_CARE_PROVIDER_SITE_OTHER): Payer: BLUE CROSS/BLUE SHIELD | Admitting: Neurology

## 2014-11-08 ENCOUNTER — Ambulatory Visit: Payer: BLUE CROSS/BLUE SHIELD | Admitting: Neurology

## 2014-11-08 VITALS — BP 98/60 | HR 69 | Ht 67.0 in | Wt 137.0 lb

## 2014-11-08 DIAGNOSIS — R413 Other amnesia: Secondary | ICD-10-CM

## 2014-11-08 DIAGNOSIS — F411 Generalized anxiety disorder: Secondary | ICD-10-CM | POA: Diagnosis not present

## 2014-11-08 NOTE — Progress Notes (Signed)
NEUROLOGY FOLLOW UP OFFICE NOTE  [name] [id number]  HISTORY OF PRESENT ILLNESS: I had the pleasure of seeing Wayne Silva in follow-up in the neurology clinic on 11/08/2014.  The patient was last seen 2 months ago for worsening memory. He underwent Neuropsychological evaluation with the following diagnosis:  Impression: Cognitive Diagnosis Deferred. Generalized Anxiety Disorder. Results of cognitive testing were largely within normal limits but did reveal relative weaknesses on verbal memory tests at the level of initial encoding. The pattern of performances on memory tests suggests that he becomes overwhelmed by incoming information when it is presented to him for the first time, and putting pressure on himself naturally leads to reduced encoding. Clinical interview, as well as psychological testing, clearly demonstrate a clinically significant level of anxiety consistent with his diagnosis of GAD. Based on the available data, it is my clinical opinion that the patient's subjective cognitive complaints, and relative weakness with encoding of new information shown on testing, are most likely attributable to his generalized anxiety disorder. Continued treatment of anxiety is warranted.   He presents today stating that memory is largely unchanged, however he is more aware of contribution by anxiety and continues to work with [employer]. He denies any headaches, dizziness, diplopia, focal numbness/tingling/weakness.   HPI: This is 54 yo ambidextrous right-hand dominant man with a history of anxiety and asthma who presented in April 2016 for worsening memory. He has a family history of Alzheimer's disease in his father and paternal grandmother in their 69s. He started noticing memory problems in his 38s where he could not recall certain things. He has noticed this worsened recently with difficulties multitasking, he would be doing something, think of another thing, and forget what he was doing  initially. He would walk into a grocery and forget his groceries. One time he left his card in the card reader and another customer had to call him back. He would not recall a name unless he puts more effort in remembering it (he now puts it on a tablet). He has found that if he does this, he would remember better than he thought he would otherwise do. He would be driving and wonder where he is, then he is usually able to reorient himself easily. He forgets things all over the office, always leaving things around. He states this has not significantly affected his job. He has had problems pulling words up. He denies any missed bill payments, no difficulties with ADLs. He tried taking a supplement called CogniQ and did notice that he was able to pull up his words faster and "boost my memory," but the caffeine ingredient made him more irritable. 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 He has generalized anxiety disorder and used to take Xanax. He has been on Zoloft since 2014 which takes the edge off, but he is "still pretty anxious."  PAST MEDICAL HISTORY: Past Medical History  Diagnosis Date  . Alcoholic   . Asthma   . Spastic bladder   . Anxiety     MEDICATIONS: Current Outpatient Prescriptions on File Prior to Visit  Medication Sig Dispense Refill  . albuterol (VENTOLIN HFA) 108 (90 BASE) MCG/ACT inhaler Inhale 1-2 puffs into the lungs every 6 (six) hours as needed for wheezing or shortness of breath. 18 g 11  . Mometasone Furoate (ASMANEX HFA) 100 MCG/ACT AERO Inhale 1 Act into the lungs 2 times daily  at 12 noon and 4 pm. 13 g 11  . montelukast (SINGULAIR) 10 MG tablet Take 1 tablet (10 mg total) by mouth at bedtime. 90 tablet 3  . Multiple Vitamin (MULTIVITAMIN WITH MINERALS) TABS Take 1 tablet by mouth daily.    . Omega-3 Fatty Acids (FISH OIL PO) Take by mouth daily.    . pentosan polysulfate (ELMIRON) 100 MG capsule  Take 1 capsule (100 mg total) by mouth 3 (three) times daily. 90 capsule 11  . sertraline (ZOLOFT) 25 MG tablet Take 1 tablet (25 mg total) by mouth daily. 90 tablet 3  . triamcinolone cream (KENALOG) 0.5 % Apply 1 application topically 3 (three) times daily. 30 g 1  . valACYclovir (VALTREX) 1000 MG tablet Take 1 tablet by mouth daily.     No current facility-administered medications on file prior to visit.    ALLERGIES: No Known Allergies  FAMILY HISTORY: Family History  Problem Relation Age of Onset  . Colon cancer Father   . Prostate cancer Paternal Grandfather   . Skin cancer Mother   . Alzheimer's disease Father   . Alzheimer's disease Maternal Grandmother     SOCIAL HISTORY: History   Social History  . Marital Status: Single    Spouse Name: N/A  . Number of Children: N/A  . Years of Education: N/A   Occupational History  . Not on file.   Social History Main Topics  . Smoking status: Former Smoker    Quit date: 08/22/2004  . Smokeless tobacco: Never Used     Comment: social smoker  . Alcohol Use: No     Comment: was an alcoholic (quit 10 years ago)  . Drug Use: No  . Sexual Activity:    Partners: Female    Pharmacist, hospital Protection: Condom   Other Topics Concern  . Not on file   Social History Narrative    REVIEW OF SYSTEMS: Constitutional: No fevers, chills, or sweats, no generalized fatigue, change in appetite Eyes: No visual changes, double vision, eye pain Ear, nose and throat: No hearing loss, ear pain, nasal congestion, sore throat Cardiovascular: No chest pain, palpitations Respiratory:  No shortness of breath at rest or with exertion, wheezes GastrointestinaI: No nausea, vomiting, diarrhea, abdominal pain, fecal incontinence Genitourinary:  No dysuria, urinary retention or frequency Musculoskeletal:  No neck pain, +back pain Integumentary: No rash, pruritus, skin lesions Neurological: as above Psychiatric: No depression, insomnia,  anxiety Endocrine: No palpitations, fatigue, diaphoresis, mood swings, change in appetite, change in weight, increased thirst Hematologic/Lymphatic:  No anemia, purpura, petechiae. Allergic/Immunologic: no itchy/runny eyes, nasal congestion, recent allergic reactions, rashes  PHYSICAL EXAM: Filed Vitals:   11/08/14 0840  BP: 98/60  Pulse: 69   General: No acute distress Head:  Normocephalic/atraumatic Neck: supple, no paraspinal tenderness, full range of motion Heart:  Regular rate and rhythm Lungs:  Clear to auscultation bilaterally Back: No paraspinal tenderness Skin/Extremities: No rash, no edema Neurological Exam: alert and oriented to person, place, and time. No aphasia or dysarthria. Fund of knowledge is appropriate.  Recent and remote memory are intact.  Attention and concentration are normal.    Able to name objects and repeat phrases. Cranial nerves: Pupils equal, round, reactive to light.  Fundoscopic exam unremarkable, no papilledema. Extraocular movements intact with no nystagmus. Visual fields full. Facial sensation intact. No facial asymmetry. Tongue, uvula, palate midline.  Motor: Bulk and tone normal, muscle strength 5/5 throughout with no pronator drift.  Sensation to light touch intact.  No extinction  to double simultaneous stimulation.  Deep tendon reflexes 2+ throughout, toes downgoing.  Finger to nose testing intact.  Gait narrow-based and steady, able to tandem walk adequately.  Romberg negative.  IMPRESSION: This is a 54 yo RH man with a history of anxiety, family history of Alzheimer's disease in 2 family members, who presented with worsening memory. TSH, B12, RPR normal. We reviewed his MRI brain in the office today, which was normal. We discussed at length Neuropsychological evaluation indicating that cognitive complaints and relative weakness with encoding new information is most likely due to generalized anxiety disorder. He recognizes this as well and will continue  to work with [facility]. We again discussed the importance of control of vascular risk factors, physical exercise, and brain stimulation exercises for brain health. He would like to continue monitoring memory due to family history of Alzheimer's disease, and will follow-up in 1 year. He knows to call our office for any changes.   Thank you for allowing me to participate in his care.  Please do not hesitate to call for any questions or concerns.  The duration of this appointment visit was 25 minutes of face-to-face time with the patient.  Greater than 50% of this time was spent in counseling, explanation of diagnosis, planning of further management, and coordination of care.   Patrcia DollyKaren Yussuf Sawyers, M.D.   CC: Dr. Yetta BarreJones

## 2014-11-08 NOTE — Patient Instructions (Signed)
1. Continue working with psychiatrist and psychologist  2. Physical exercise and brain stimulation exercises are important for brain health 3. Follow-up in 1 year, call for any problems

## 2015-01-03 ENCOUNTER — Ambulatory Visit: Payer: BLUE CROSS/BLUE SHIELD | Admitting: Internal Medicine

## 2015-01-09 ENCOUNTER — Encounter: Payer: Self-pay | Admitting: Internal Medicine

## 2015-01-09 ENCOUNTER — Ambulatory Visit (INDEPENDENT_AMBULATORY_CARE_PROVIDER_SITE_OTHER): Payer: BLUE CROSS/BLUE SHIELD | Admitting: Internal Medicine

## 2015-01-09 VITALS — BP 112/68 | HR 64 | Temp 97.7°F | Resp 16 | Ht 67.0 in | Wt 134.0 lb

## 2015-01-09 DIAGNOSIS — J45991 Cough variant asthma: Secondary | ICD-10-CM

## 2015-01-09 DIAGNOSIS — F411 Generalized anxiety disorder: Secondary | ICD-10-CM | POA: Diagnosis not present

## 2015-01-09 DIAGNOSIS — Z23 Encounter for immunization: Secondary | ICD-10-CM | POA: Diagnosis not present

## 2015-01-09 MED ORDER — MONTELUKAST SODIUM 10 MG PO TABS: 10.0000 mg | ORAL_TABLET | Freq: Every day | ORAL | Status: DC

## 2015-01-09 MED ORDER — SERTRALINE HCL 25 MG PO TABS: 25.0000 mg | ORAL_TABLET | Freq: Every day | ORAL | Status: DC

## 2015-01-09 MED ORDER — ALBUTEROL SULFATE HFA 108 (90 BASE) MCG/ACT IN AERS: 1.0000 | INHALATION_SPRAY | Freq: Four times a day (QID) | RESPIRATORY_TRACT | Status: DC | PRN

## 2015-01-09 NOTE — Progress Notes (Signed)
Pre visit review using our clinic review tool, if applicable. No additional management support is needed unless otherwise documented below in the visit note. 

## 2015-01-09 NOTE — Patient Instructions (Signed)

## 2015-01-10 NOTE — Progress Notes (Signed)
Subjective:  Patient ID: Wayne Silva, male    DOB: 1960-12-15  Age: 54 y.o. MRN: [id number]  CC: Asthma   HPI Wayne Silva presents for follow-up on asthma. He rarely has to use the albuterol inhaler. He tells me his symptoms are well controlled with a combination of Singulair and Dulera. He feels well today and offers no new complaints. He remainsd slightly anxious but tells me he is working with two therapists on his mindfulness and meditation practices.  Outpatient Prescriptions Prior to Visit  Medication Sig Dispense Refill  . Mometasone Furoate (ASMANEX HFA) 100 MCG/ACT AERO Inhale 1 Act into the lungs 2 times daily at 12 noon and 4 pm. 13 g 11  . Multiple Vitamin (MULTIVITAMIN WITH MINERALS) TABS Take 1 tablet by mouth daily.    . Omega-3 Fatty Acids (FISH OIL PO) Take by mouth daily.    . pentosan polysulfate (ELMIRON) 100 MG capsule Take 1 capsule (100 mg total) by mouth 3 (three) times daily. 90 capsule 11  . valACYclovir (VALTREX) 1000 MG tablet Take 1 tablet by mouth daily.    Marland Kitchen albuterol (VENTOLIN HFA) 108 (90 BASE) MCG/ACT inhaler Inhale 1-2 puffs into the lungs every 6 (six) hours as needed for wheezing or shortness of breath. 18 g 11  . montelukast (SINGULAIR) 10 MG tablet Take 1 tablet (10 mg total) by mouth at bedtime. 90 tablet 3  . sertraline (ZOLOFT) 25 MG tablet Take 1 tablet (25 mg total) by mouth daily. 90 tablet 3  . triamcinolone cream (KENALOG) 0.5 % Apply 1 application topically 3 (three) times daily. 30 g 1   No facility-administered medications prior to visit.    ROS Review of Systems  Constitutional: Negative.  Negative for fever, chills, diaphoresis, appetite change and fatigue.  HENT: Negative.   Eyes: Negative.   Respiratory: Negative.  Negative for cough, choking, chest tightness, shortness of breath, wheezing and stridor.   Cardiovascular: Negative.  Negative for chest pain and leg swelling.  Gastrointestinal: Negative.  Negative for abdominal pain,  diarrhea, constipation and blood in stool.  Endocrine: Negative.   Genitourinary: Negative.   Musculoskeletal: Negative.   Skin: Negative.  Negative for rash.  Allergic/Immunologic: Negative.   Neurological: Negative.   Hematological: Negative.  Negative for adenopathy. Does not bruise/bleed easily.  Psychiatric/Behavioral: Negative for suicidal ideas, confusion, sleep disturbance and decreased concentration. The patient is nervous/anxious.     Objective:  BP 112/68 mmHg  Pulse 64  Temp(Src) 97.7 F (36.5 C) (Oral)  Ht 5\' 7"  (1.702 m)  Wt 134 lb (60.782 kg)  BMI 20.98 kg/m2  SpO2 96%  BP Readings from Last 3 Encounters:  01/09/15 112/68  11/08/14 98/60  10/31/14 100/64    Wt Readings from Last 3 Encounters:  01/09/15 134 lb (60.782 kg)  11/08/14 137 lb (62.143 kg)  10/31/14 132 lb (59.875 kg)    Physical Exam  Constitutional: He is oriented to person, place, and time. He appears well-developed and well-nourished. No distress.  HENT:  Mouth/Throat: Oropharynx is clear and moist. No oropharyngeal exudate.  Eyes: Conjunctivae are normal. Right eye exhibits no discharge. Left eye exhibits no discharge. No scleral icterus.  Neck: Normal range of motion. Neck supple. No JVD present. No tracheal deviation present. No thyromegaly present.  Cardiovascular: Normal rate, regular rhythm, normal heart sounds and intact distal pulses.  Exam reveals no gallop and no friction rub.   No murmur heard. Pulmonary/Chest: Effort normal and breath sounds normal. No stridor. No respiratory distress.  He has no wheezes. He has no rales. He exhibits no tenderness.  Abdominal: Soft. Bowel sounds are normal. He exhibits no distension and no mass. There is no tenderness. There is no rebound and no guarding.  Musculoskeletal: Normal range of motion. He exhibits no edema or tenderness.  Lymphadenopathy:    He has no cervical adenopathy.  Neurological: He is oriented to person, place, and time.  Skin:  Skin is warm and dry. No rash noted. He is not diaphoretic. No erythema. No pallor.  Vitals reviewed.   Lab Results  Component Value Date   WBC 7.0 08/26/2013   HGB 15.2 08/26/2013   HCT 44.9 08/26/2013   PLT 179.0 08/26/2013   GLUCOSE 57* 08/26/2013   CHOL 166 08/26/2013   TRIG 77.0 08/26/2013   HDL 64.70 08/26/2013   LDLCALC 86 08/26/2013   ALT 42 08/26/2013   AST 26 08/26/2013   NA 138 08/26/2013   K 4.3 08/26/2013   CL 102 08/26/2013   CREATININE 0.9 08/26/2013   BUN 22 08/26/2013   CO2 30 08/26/2013   TSH 1.952 08/23/2014   PSA 1.13 08/26/2013    [name] Contrast  10/03/2014   CLINICAL DATA:  54 year old male with memory loss ongoing for 5 years. Initial encounter.  EXAM: MRI HEAD WITHOUT CONTRAST  TECHNIQUE: Multiplanar, multiecho pulse sequences of the brain and surrounding structures were obtained without intravenous contrast.  COMPARISON:  None.  FINDINGS: No acute infarct.  No intracranial hemorrhage.  No hydrocephalus or advanced atrophy.  No intracranial mass lesion noted on this unenhanced exam.  Major intracranial vascular structures are patent.  Partial opacification right mastoid air cells without obstructing lesion of the eustachian tube noted.  Minimal mucosal thickening ethmoid sinus air cells and maxillary sinuses.  Cervical medullary junction, pituitary region, pineal region and orbital structures unremarkable.  IMPRESSION: Partial opacification right mastoid air cells otherwise negative unenhanced MR brain as noted above.   Electronically Signed   By: Lacy Duverney M.D.   On: 10/03/2014 14:04    Assessment & Plan:   Wayne Silva was seen today for asthma.  Diagnoses and all orders for this visit:  Cough variant asthma- his symptoms are well controlled, will continue the current regimen. -     montelukast (SINGULAIR) 10 MG tablet; Take 1 tablet (10 mg total) by mouth at bedtime. -     albuterol (VENTOLIN HFA) 108 (90 BASE) MCG/ACT inhaler; Inhale 1-2 puffs into  the lungs every 6 (six) hours as needed for wheezing or shortness of breath.  GAD (generalized anxiety disorder)- he is not willing to increase his dose of sertraline, he will continue psychotherapy with his 2 therapists. -     sertraline (ZOLOFT) 25 MG tablet; Take 1 tablet (25 mg total) by mouth daily.  Need for prophylactic vaccination and inoculation against influenza -     Flu Vaccine QUAD 36+ mos IM  Need for prophylactic vaccination and inoculation against viral hepatitis -     Hepatitis A hepatitis B combined vaccine IM   I have discontinued Mr. Poteete triamcinolone cream. I am also having him maintain his multivitamin with minerals, pentosan polysulfate, valACYclovir, Mometasone Furoate, Omega-3 Fatty Acids (FISH OIL PO), montelukast, albuterol, and sertraline.  Meds ordered this encounter  Medications  . montelukast (SINGULAIR) 10 MG tablet    Sig: Take 1 tablet (10 mg total) by mouth at bedtime.    Dispense:  90 tablet    Refill:  3  . albuterol (VENTOLIN HFA)  108 (90 BASE) MCG/ACT inhaler    Sig: Inhale 1-2 puffs into the lungs every 6 (six) hours as needed for wheezing or shortness of breath.    Dispense:  18 g    Refill:  11  . sertraline (ZOLOFT) 25 MG tablet    Sig: Take 1 tablet (25 mg total) by mouth daily.    Dispense:  90 tablet    Refill:  3     Follow-up: Return in about 6 months (around 07/10/2015).  Sanda Linger, MD

## 2015-03-19 ENCOUNTER — Telehealth: Payer: Self-pay | Admitting: Internal Medicine

## 2015-03-19 DIAGNOSIS — F411 Generalized anxiety disorder: Secondary | ICD-10-CM

## 2015-03-19 MED ORDER — SERTRALINE HCL 50 MG PO TABS: 50.0000 mg | ORAL_TABLET | Freq: Every day | ORAL | Status: DC

## 2015-03-19 NOTE — Telephone Encounter (Signed)
rx changed

## 2015-03-19 NOTE — Telephone Encounter (Signed)
Please advise 

## 2015-03-19 NOTE — Telephone Encounter (Signed)
Patient is requesting an increase on qty for sertraline (ZOLOFT) 25 MG tablet [[id number]][[id number]] . States that he is in a stressful situation, and would like to option to occasionally take 2 rather than 1.

## 2015-05-10 ENCOUNTER — Ambulatory Visit: Payer: BLUE CROSS/BLUE SHIELD | Admitting: Family

## 2015-05-10 ENCOUNTER — Encounter: Payer: Self-pay | Admitting: Family

## 2015-05-10 ENCOUNTER — Ambulatory Visit (INDEPENDENT_AMBULATORY_CARE_PROVIDER_SITE_OTHER): Payer: BLUE CROSS/BLUE SHIELD | Admitting: Family

## 2015-05-10 VITALS — BP 110/70 | HR 56 | Temp 98.2°F | Resp 18 | Ht 67.0 in | Wt 139.0 lb

## 2015-05-10 DIAGNOSIS — J069 Acute upper respiratory infection, unspecified: Secondary | ICD-10-CM

## 2015-05-10 MED ORDER — FLUTICASONE FUROATE-VILANTEROL 100-25 MCG/INH IN AEPB: 1.0000 | INHALATION_SPRAY | Freq: Every day | RESPIRATORY_TRACT | Status: DC

## 2015-05-10 NOTE — Assessment & Plan Note (Signed)
Symptoms and exam consistent with acute upper respiratory infection and mild asthma exacerbation. Most likely viral as her has been significant improvement over last couple days. Treat conservatively with over-the-counter medications as needed for symptom relief and supportive care. Start Breo. Continue previously prescribed dosage of albuterol as needed. Follow-up if symptoms worsen or fail to improve.

## 2015-05-10 NOTE — Progress Notes (Signed)
Subjective:    Patient ID: Wayne Silva, male    DOB: Jul 27, 1960, 54 y.o.   MRN: [id number]  Chief Complaint  Patient presents with  . Cough    x1 week, cough and a little congestion    HPI:  Wayne Silva is a 54 y.o. male who  has a past medical history of Alcoholic (HCC); Asthma; Spastic bladder; and Anxiety. and presents today for an acute office visit.   This is a new problem. Associated symptoms of cough and congestion have been going on for about 1 week. Modifying factors include the E-Z cold lozonges. Denies fevers. Course of the symptoms has gradually improved over the course of the past few days. Severity of the cough is enough to disturb his sleep. No recent antibiotic use.  No Known Allergies   Current Outpatient Prescriptions on File Prior to Visit  Medication Sig Dispense Refill  . albuterol (VENTOLIN HFA) 108 (90 BASE) MCG/ACT inhaler Inhale 1-2 puffs into the lungs every 6 (six) hours as needed for wheezing or shortness of breath. 18 g 11  . Mometasone Furoate (ASMANEX HFA) 100 MCG/ACT AERO Inhale 1 Act into the lungs 2 times daily at 12 noon and 4 pm. 13 g 11  . montelukast (SINGULAIR) 10 MG tablet Take 1 tablet (10 mg total) by mouth at bedtime. 90 tablet 3  . Multiple Vitamin (MULTIVITAMIN WITH MINERALS) TABS Take 1 tablet by mouth daily.    . Omega-3 Fatty Acids (FISH OIL PO) Take by mouth daily.    . pentosan polysulfate (ELMIRON) 100 MG capsule Take 1 capsule (100 mg total) by mouth 3 (three) times daily. 90 capsule 11  . sertraline (ZOLOFT) 50 MG tablet Take 1 tablet (50 mg total) by mouth daily. 90 tablet 3  . valACYclovir (VALTREX) 1000 MG tablet Take 1 tablet by mouth daily.     No current facility-administered medications on file prior to visit.    Review of Systems  Constitutional: Negative for fever and chills.  HENT: Positive for congestion. Negative for sinus pressure and sore throat.   Respiratory: Positive for cough. Negative for chest tightness and  shortness of breath.   Neurological: Negative for headaches.      Objective:    BP 110/70 mmHg  Pulse 56  Temp(Src) 98.2 F (36.8 C) (Oral)  Resp 18  Ht  (1.702 m)  Wt 139 lb (63.05 kg)  BMI 21.77 kg/m2  SpO2 98% Nursing note and vital signs reviewed.  Physical Exam  Constitutional: He is oriented to person, place, and time. He appears well-developed and well-nourished. No distress.  HENT:  Right Ear: Hearing, tympanic membrane, external ear and ear canal normal.  Left Ear: Hearing, tympanic membrane, external ear and ear canal normal.  Nose: Right sinus exhibits no maxillary sinus tenderness and no frontal sinus tenderness. Left sinus exhibits no maxillary sinus tenderness and no frontal sinus tenderness.  Mouth/Throat: Uvula is midline, oropharynx is clear and moist and mucous membranes are normal.  Cardiovascular: Normal rate, regular rhythm, normal heart sounds and intact distal pulses.   Pulmonary/Chest: Effort normal and breath sounds normal.  Neurological: He is alert and oriented to person, place, and time.  Skin: Skin is warm and dry.  Psychiatric: He has a normal mood and affect. His behavior is normal. Judgment and thought content normal.       Assessment & Plan:   Problem List Items Addressed This Visit      Respiratory   Acute upper respiratory  infection - Primary    Symptoms and exam consistent with acute upper respiratory infection and mild asthma exacerbation. Most likely viral as her has been significant improvement over last couple days. Treat conservatively with over-the-counter medications as needed for symptom relief and supportive care. Start Breo. Continue previously prescribed dosage of albuterol as needed. Follow-up if symptoms worsen or fail to improve.      Relevant Medications   Fluticasone Furoate-Vilanterol (BREO ELLIPTA) 100-25 MCG/INH AEPB

## 2015-05-10 NOTE — Progress Notes (Signed)
Pre visit review using our clinic review tool, if applicable. No additional management support is needed unless otherwise documented below in the visit note. 

## 2015-05-10 NOTE — Patient Instructions (Addendum)
Thank you for choosing [facility].  Summary/Instructions:  Your prescription(s) have been submitted to your pharmacy or been printed and provided for you. Please take as directed and contact our office if you believe you are having problem(s) with the medication(s) or have any questions.  If your symptoms worsen or fail to improve, please contact our office for further instruction, or in case of emergency go directly to the emergency room at the closest medical facility.    General Recommendations:    Please drink plenty of fluids.  Get plenty of rest   Sleep in humidified air  Use saline nasal sprays  Netti pot   OTC Medications:  Decongestants - helps relieve congestion   Flonase (generic fluticasone) or Nasacort (generic triamcinolone) - please make sure to use the "cross-over" technique at a 45 degree angle towards the opposite eye as opposed to straight up the nasal passageway.   Sudafed (generic pseudoephedrine - Note this is the one that is available behind the pharmacy counter); Products with phenylephrine (-PE) may also be used but is often not as effective as pseudoephedrine.   If you have HIGH BLOOD PRESSURE - Coricidin HBP; AVOID any product that is -D as this contains pseudoephedrine which may increase your blood pressure.  Afrin (oxymetazoline) every 6-8 hours for up to 3 days.   Allergies - helps relieve runny nose, itchy eyes and sneezing   Claritin (generic loratidine), Allegra (fexofenidine), or Zyrtec (generic cyrterizine) for runny nose. These medications should not cause drowsiness.  Note - Benadryl (generic diphenhydramine) may be used however may cause drowsiness  Cough -   Delsym or Robitussin (generic dextromethorphan)  Expectorants - helps loosen mucus to ease removal   Mucinex (generic guaifenesin) as directed on the package.  Headaches / General Aches   Tylenol (generic acetaminophen) - DO NOT EXCEED 3 grams (3,000 mg) in a 24  hour time period  Advil/Motrin (generic ibuprofen)   Sore Throat -   Salt water gargle   Chloraseptic (generic benzocaine) spray or lozenges / Sucrets (generic dyclonine)    Upper Respiratory Infection, Adult Most upper respiratory infections (URIs) are a viral infection of the air passages leading to the lungs. A URI affects the nose, throat, and upper air passages. The most common type of URI is nasopharyngitis and is typically referred to as "the common cold." URIs run their course and usually go away on their own. Most of the time, a URI does not require medical attention, but sometimes a bacterial infection in the upper airways can follow a viral infection. This is called a secondary infection. Sinus and middle ear infections are common types of secondary upper respiratory infections. Bacterial pneumonia can also complicate a URI. A URI can worsen asthma and chronic obstructive pulmonary disease (COPD). Sometimes, these complications can require emergency medical care and may be life threatening.  CAUSES Almost all URIs are caused by viruses. A virus is a type of germ and can spread from one person to another.  RISKS FACTORS You may be at risk for a URI if:   You smoke.   You have chronic heart or lung disease.  You have a weakened defense (immune) system.   You are very young or very old.   You have nasal allergies or asthma.  You work in crowded or poorly ventilated areas.  You work in health care facilities or schools. SIGNS AND SYMPTOMS  Symptoms typically develop 2-3 days after you come in contact with a cold virus. Most   viral URIs last 7-10 days. However, viral URIs from the influenza virus (flu virus) can last 14-18 days and are typically more severe. Symptoms may include:   Runny or stuffy (congested) nose.   Sneezing.   Cough.   Sore throat.   Headache.   Fatigue.   Fever.   Loss of appetite.   Pain in your forehead, behind your eyes, and  over your cheekbones (sinus pain).  Muscle aches.  DIAGNOSIS  Your health care provider may diagnose a URI by:  Physical exam.  Tests to check that your symptoms are not due to another condition such as:  Strep throat.  Sinusitis.  Pneumonia.  Asthma. TREATMENT  A URI goes away on its own with time. It cannot be cured with medicines, but medicines may be prescribed or recommended to relieve symptoms. Medicines may help:  Reduce your fever.  Reduce your cough.  Relieve nasal congestion. HOME CARE INSTRUCTIONS   Take medicines only as directed by your health care provider.   Gargle warm saltwater or take cough drops to comfort your throat as directed by your health care provider.  Use a warm mist humidifier or inhale steam from a shower to increase air moisture. This may make it easier to breathe.  Drink enough fluid to keep your urine clear or pale yellow.   Eat soups and other clear broths and maintain good nutrition.   Rest as needed.   Return to work when your temperature has returned to normal or as your health care provider advises. You may need to stay home longer to avoid infecting others. You can also use a face mask and careful hand washing to prevent spread of the virus.  Increase the usage of your inhaler if you have asthma.   Do not use any tobacco products, including cigarettes, chewing tobacco, or electronic cigarettes. If you need help quitting, ask your health care provider. PREVENTION  The best way to protect yourself from getting a cold is to practice good hygiene.   Avoid oral or hand contact with people with cold symptoms.   Wash your hands often if contact occurs.  There is no clear evidence that vitamin C, vitamin E, echinacea, or exercise reduces the chance of developing a cold. However, it is always recommended to get plenty of rest, exercise, and practice good nutrition.  SEEK MEDICAL CARE IF:   You are getting worse rather than  better.   Your symptoms are not controlled by medicine.   You have chills.  You have worsening shortness of breath.  You have brown or red mucus.  You have yellow or brown nasal discharge.  You have pain in your face, especially when you bend forward.  You have a fever.  You have swollen neck glands.  You have pain while swallowing.  You have white areas in the back of your throat. SEEK IMMEDIATE MEDICAL CARE IF:   You have severe or persistent:  Headache.  Ear pain.  Sinus pain.  Chest pain.  You have chronic lung disease and any of the following:  Wheezing.  Prolonged cough.  Coughing up blood.  A change in your usual mucus.  You have a stiff neck.  You have changes in your:  Vision.  Hearing.  Thinking.  Mood. MAKE SURE YOU:   Understand these instructions.  Will watch your condition.  Will get help right away if you are not doing well or get worse.   This information is not intended to replace advice   given to you by your health care provider. Make sure you discuss any questions you have with your health care provider.   Document Released: 10/22/2000 Document Revised: 09/12/2014 Document Reviewed: 08/03/2013 Elsevier Interactive Patient Education 2016 [employer]  

## 2015-07-02 ENCOUNTER — Encounter: Payer: Self-pay | Admitting: Neurology

## 2015-07-10 ENCOUNTER — Other Ambulatory Visit (INDEPENDENT_AMBULATORY_CARE_PROVIDER_SITE_OTHER): Payer: BLUE CROSS/BLUE SHIELD

## 2015-07-10 ENCOUNTER — Ambulatory Visit (INDEPENDENT_AMBULATORY_CARE_PROVIDER_SITE_OTHER): Payer: BLUE CROSS/BLUE SHIELD | Admitting: Internal Medicine

## 2015-07-10 ENCOUNTER — Encounter: Payer: Self-pay | Admitting: Internal Medicine

## 2015-07-10 VITALS — BP 100/70 | HR 67 | Temp 97.9°F | Resp 16 | Ht 67.0 in | Wt 134.0 lb

## 2015-07-10 DIAGNOSIS — J069 Acute upper respiratory infection, unspecified: Secondary | ICD-10-CM

## 2015-07-10 DIAGNOSIS — Z0000 Encounter for general adult medical examination without abnormal findings: Secondary | ICD-10-CM

## 2015-07-10 DIAGNOSIS — J45991 Cough variant asthma: Secondary | ICD-10-CM

## 2015-07-10 DIAGNOSIS — B9789 Other viral agents as the cause of diseases classified elsewhere: Secondary | ICD-10-CM

## 2015-07-10 LAB — CBC WITH DIFFERENTIAL/PLATELET
Basophils Absolute: 0 10*3/uL (ref 0.0–0.1)
Basophils Relative: 0.3 % (ref 0.0–3.0)
EOS PCT: 3.4 % (ref 0.0–5.0)
Eosinophils Absolute: 0.1 10*3/uL (ref 0.0–0.7)
HCT: 42.5 % (ref 39.0–52.0)
Hemoglobin: 14.6 g/dL (ref 13.0–17.0)
LYMPHS ABS: 1 10*3/uL (ref 0.7–4.0)
LYMPHS PCT: 25.2 % (ref 12.0–46.0)
MCHC: 34.3 g/dL (ref 30.0–36.0)
MCV: 89 fl (ref 78.0–100.0)
MONOS PCT: 14.4 % — AB (ref 3.0–12.0)
Monocytes Absolute: 0.5 10*3/uL (ref 0.1–1.0)
NEUTROS PCT: 56.7 % (ref 43.0–77.0)
Neutro Abs: 2.1 10*3/uL (ref 1.4–7.7)
Platelets: 147 10*3/uL — ABNORMAL LOW (ref 150.0–400.0)
RBC: 4.78 Mil/uL (ref 4.22–5.81)
RDW: 13.9 % (ref 11.5–15.5)
WBC: 3.8 10*3/uL — ABNORMAL LOW (ref 4.0–10.5)

## 2015-07-10 LAB — COMPREHENSIVE METABOLIC PANEL
ALK PHOS: 78 U/L (ref 39–117)
ALT: 17 U/L (ref 0–53)
AST: 20 U/L (ref 0–37)
Albumin: 4 g/dL (ref 3.5–5.2)
BUN: 19 mg/dL (ref 6–23)
CALCIUM: 8.8 mg/dL (ref 8.4–10.5)
CO2: 34 meq/L — AB (ref 19–32)
Chloride: 99 mEq/L (ref 96–112)
Creatinine, Ser: 0.88 mg/dL (ref 0.40–1.50)
GFR: 95.94 mL/min (ref >60.00)
GLUCOSE: 79 mg/dL (ref 70–99)
POTASSIUM: 4 meq/L (ref 3.5–5.1)
Sodium: 139 mEq/L (ref 135–145)
TOTAL PROTEIN: 6.6 g/dL (ref 6.0–8.3)
Total Bilirubin: 0.4 mg/dL (ref 0.2–1.2)

## 2015-07-10 LAB — LIPID PANEL
CHOL/HDL RATIO: 2
Cholesterol: 163 mg/dL (ref 0–200)
HDL: 66.5 mg/dL (ref >39.00)
LDL Cholesterol: 84 mg/dL (ref 0–99)
NONHDL: 96.77
Triglycerides: 65 mg/dL (ref 0.0–149.0)
VLDL: 13 mg/dL (ref 0.0–40.0)

## 2015-07-10 LAB — FECAL OCCULT BLOOD, GUAIAC: Fecal Occult Blood: NEGATIVE

## 2015-07-10 LAB — URINALYSIS, ROUTINE W REFLEX MICROSCOPIC
BILIRUBIN URINE: NEGATIVE
HGB URINE DIPSTICK: NEGATIVE
KETONES UR: NEGATIVE
LEUKOCYTES UA: NEGATIVE
NITRITE: NEGATIVE
Specific Gravity, Urine: 1.015 (ref 1.000–1.030)
Total Protein, Urine: NEGATIVE
UROBILINOGEN UA: 0.2 (ref 0.0–1.0)
Urine Glucose: NEGATIVE
pH: 6 (ref 5.0–8.0)

## 2015-07-10 LAB — HEPATITIS C ANTIBODY: HCV AB: NEGATIVE

## 2015-07-10 LAB — PSA: PSA: 0.94 ng/mL (ref 0.10–4.00)

## 2015-07-10 LAB — TSH: TSH: 1.37 u[IU]/mL (ref 0.35–4.50)

## 2015-07-10 MED ORDER — MOMETASONE FUROATE 100 MCG/ACT IN AERO: 1.0000 | INHALATION_SPRAY | Freq: Two times a day (BID) | RESPIRATORY_TRACT | Status: DC

## 2015-07-10 NOTE — Patient Instructions (Signed)

## 2015-07-10 NOTE — Progress Notes (Signed)
Subjective:  Patient ID: Wayne Silva, male    DOB: June 24, 1960  Age: 55 y.o. MRN: [id number]  CC: Asthma; URI; and Annual Exam   HPI Wayne Silva presents for a physical but he also complains of upper respiratory symptoms that started about 6 days ago and are gradually improving. He has had a mild nonproductive cough with chills. He denies sore throat, fever, shortness of breath, night sweats, or wheezing. He tells me that his asthma is well controlled. He has not taken anything for symptoms and does not request anything for symptom relief.  Outpatient Prescriptions Prior to Visit  Medication Sig Dispense Refill  . albuterol (VENTOLIN HFA) 108 (90 BASE) MCG/ACT inhaler Inhale 1-2 puffs into the lungs every 6 (six) hours as needed for wheezing or shortness of breath. 18 g 11  . montelukast (SINGULAIR) 10 MG tablet Take 1 tablet (10 mg total) by mouth at bedtime. 90 tablet 3  . Multiple Vitamin (MULTIVITAMIN WITH MINERALS) TABS Take 1 tablet by mouth daily.    . Omega-3 Fatty Acids (FISH OIL PO) Take by mouth daily.    . pentosan polysulfate (ELMIRON) 100 MG capsule Take 1 capsule (100 mg total) by mouth 3 (three) times daily. 90 capsule 11  . sertraline (ZOLOFT) 50 MG tablet Take 1 tablet (50 mg total) by mouth daily. 90 tablet 3  . valACYclovir (VALTREX) 1000 MG tablet Take 1 tablet by mouth daily.    . Fluticasone Furoate-Vilanterol (BREO ELLIPTA) 100-25 MCG/INH AEPB Inhale 1 puff into the lungs daily. 28 each 0  . Mometasone Furoate (ASMANEX HFA) 100 MCG/ACT AERO Inhale 1 Act into the lungs 2 times daily at 12 noon and 4 pm. 13 g 11   No facility-administered medications prior to visit.    ROS Review of Systems  Constitutional: Positive for chills. Negative for fever, diaphoresis, activity change, appetite change, fatigue and unexpected weight change.  HENT: Negative.  Negative for facial swelling, postnasal drip, sinus pressure, sore throat, trouble swallowing and voice change.   Eyes:  Negative.   Respiratory: Positive for cough. Negative for apnea, choking, chest tightness, shortness of breath, wheezing and stridor.   Cardiovascular: Negative.  Negative for chest pain, palpitations and leg swelling.  Gastrointestinal: Negative.  Negative for nausea, vomiting, abdominal pain, diarrhea, constipation and blood in stool.  Endocrine: Negative.   Genitourinary: Negative.  Negative for dysuria, urgency, frequency, hematuria, decreased urine volume and difficulty urinating.  Musculoskeletal: Negative.  Negative for myalgias, back pain, joint swelling and arthralgias.  Skin: Negative.  Negative for color change and rash.  Allergic/Immunologic: Negative.   Neurological: Negative.  Negative for dizziness, syncope, speech difficulty, weakness, light-headedness, numbness and headaches.  Hematological: Negative.  Negative for adenopathy. Does not bruise/bleed easily.  Psychiatric/Behavioral: Negative.     Objective:  BP 100/70 mmHg  Pulse 67  Temp(Src) 97.9 F (36.6 C) (Oral)  Resp 16  Ht  (1.702 m)  Wt 134 lb (60.782 kg)  BMI 20.98 kg/m2  SpO2 97%  BP Readings from Last 3 Encounters:  07/10/15 100/70  05/10/15 110/70  01/09/15 112/68    Wt Readings from Last 3 Encounters:  07/10/15 134 lb (60.782 kg)  05/10/15 139 lb (63.05 kg)  01/09/15 134 lb (60.782 kg)    Physical Exam  Constitutional: He is oriented to person, place, and time. He appears well-developed and well-nourished. No distress.  HENT:  Head: Normocephalic and atraumatic.  Mouth/Throat: Oropharynx is clear and moist. No oropharyngeal exudate.  Eyes: Conjunctivae are  normal. Right eye exhibits no discharge. Left eye exhibits no discharge. No scleral icterus.  Neck: Normal range of motion. Neck supple. No JVD present. No tracheal deviation present. No thyromegaly present.  Cardiovascular: Normal rate, regular rhythm, normal heart sounds and intact distal pulses.  Exam reveals no gallop and no friction  rub.   No murmur heard. Pulmonary/Chest: Effort normal and breath sounds normal. No stridor. No respiratory distress. He has no wheezes. He has no rales. He exhibits no tenderness.  Abdominal: Soft. Bowel sounds are normal. He exhibits no distension and no mass. There is no tenderness. There is no rebound and no guarding. Hernia confirmed negative in the right inguinal area and confirmed negative in the left inguinal area.  Genitourinary: Prostate normal, testes normal and penis normal. Rectal exam shows external hemorrhoid. Rectal exam shows no internal hemorrhoid, no fissure, no mass, no tenderness and anal tone normal. Guaiac negative stool. Prostate is not enlarged and not tender. Right testis shows no mass, no swelling and no tenderness. Right testis is descended. Left testis shows no mass, no swelling and no tenderness. Left testis is descended. Circumcised. No penile erythema or penile tenderness. No discharge found.  Musculoskeletal: Normal range of motion. He exhibits no edema or tenderness.  Lymphadenopathy:    He has no cervical adenopathy.       Right: No inguinal adenopathy present.       Left: No inguinal adenopathy present.  Neurological: He is oriented to person, place, and time.  Skin: Skin is warm and dry. No rash noted. He is not diaphoretic. No erythema. No pallor.  Psychiatric: He has a normal mood and affect. His behavior is normal. Judgment and thought content normal.  Vitals reviewed.   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 Initial encounter. EXAM: MRI HEAD WITHOUT CONTRAST TECHNIQUE: Multiplanar, multiecho pulse sequences of the brain and surrounding structures were obtained without intravenous contrast. COMPARISON:  None. FINDINGS: No acute infarct. No intracranial hemorrhage. No hydrocephalus or advanced atrophy. No intracranial mass lesion noted on this unenhanced exam. Major intracranial vascular structures are patent. Partial opacification right mastoid air cells without obstructing lesion of the eustachian tube noted. Minimal mucosal thickening ethmoid sinus air cells and maxillary sinuses. Cervical medullary junction, pituitary region, pineal region and orbital structures unremarkable. IMPRESSION: Partial opacification right mastoid air cells otherwise negative unenhanced Wayne brain as noted above. Electronically Signed   By: Lacy Duverney M.D.   On: 10/03/2014 14:04    Assessment & Plan:   Delmon was seen today for asthma, uri and annual exam.  Diagnoses and all orders for this visit:  Routine general medical examination at a health care facility- vaccines were reviewed and updated, exam completed, labs ordered and reviewed, he was given patient education material, his colonoscopy is up-to-date. -  Lipid panel; Future -     Comprehensive metabolic panel; Future -     CBC with Differential/Platelet; Future -     PSA; Future -     TSH; Future -     Urinalysis, Routine w reflex microscopic (not at [facility]); Future -     Hepatitis C antibody; Future  Cough variant asthma- this is well-controlled -     Discontinue: Mometasone Furoate (ASMANEX HFA) 100 MCG/ACT AERO; Inhale 1 Act into the lungs 2 times daily at 12 noon and 4 pm.  Viral URI with cough- this is a viral syndrome that is gradually improving, antibiotic to not indicated and he does not want anything for symptom relief.   I have discontinued Wayne. Schembri  Mometasone Furoate and fluticasone furoate-vilanterol. I am also having him maintain his multivitamin with minerals, pentosan polysulfate, valACYclovir, Omega-3 Fatty Acids (FISH OIL PO), montelukast, albuterol, sertraline, CIALIS, and ibuprofen.  Meds ordered this encounter  Medications  . CIALIS 20 MG tablet    Sig: TAKE 1 TABLET BY ORAL ROUTE EVERY DAY    Refill:  10  . ibuprofen (ADVIL,MOTRIN) 800 MG tablet    Sig: TAKE 1 TABLET BY MOUTH EVERY 6 TO 8 HOURS AS NEEDED FOR PAIN    Refill:  0  . DISCONTD: Mometasone Furoate (ASMANEX HFA) 100 MCG/ACT AERO    Sig: Inhale 1 Act into the lungs 2 times daily at 12 noon and 4 pm.    Dispense:  13 g    Refill:  11     Follow-up: Return if symptoms worsen or fail to improve.  Sanda Linger, MD

## 2015-07-10 NOTE — Progress Notes (Signed)
Pre visit review using our clinic review tool, if applicable. No additional management support is needed unless otherwise documented below in the visit note. 

## 2015-07-11 ENCOUNTER — Encounter: Payer: Self-pay | Admitting: Internal Medicine

## 2015-07-11 ENCOUNTER — Other Ambulatory Visit: Payer: Self-pay

## 2015-07-11 DIAGNOSIS — B9789 Other viral agents as the cause of diseases classified elsewhere: Secondary | ICD-10-CM

## 2015-07-11 DIAGNOSIS — J45991 Cough variant asthma: Secondary | ICD-10-CM

## 2015-07-11 DIAGNOSIS — J069 Acute upper respiratory infection, unspecified: Secondary | ICD-10-CM | POA: Insufficient documentation

## 2015-07-11 MED ORDER — MOMETASONE FUROATE 100 MCG/ACT IN AERO: 1.0000 | INHALATION_SPRAY | Freq: Two times a day (BID) | RESPIRATORY_TRACT | Status: DC

## 2015-07-11 NOTE — Telephone Encounter (Signed)
recevied faxed stating ins requiring 90 day supply

## 2015-08-28 DIAGNOSIS — N138 Other obstructive and reflux uropathy: Secondary | ICD-10-CM | POA: Diagnosis not present

## 2015-08-28 DIAGNOSIS — N3010 Interstitial cystitis (chronic) without hematuria: Secondary | ICD-10-CM | POA: Diagnosis not present

## 2015-08-28 DIAGNOSIS — R358 Other polyuria: Secondary | ICD-10-CM | POA: Diagnosis not present

## 2015-08-28 DIAGNOSIS — N401 Enlarged prostate with lower urinary tract symptoms: Secondary | ICD-10-CM | POA: Diagnosis not present

## 2015-09-10 DIAGNOSIS — F329 Major depressive disorder, single episode, unspecified: Secondary | ICD-10-CM | POA: Diagnosis not present

## 2015-09-10 DIAGNOSIS — F411 Generalized anxiety disorder: Secondary | ICD-10-CM | POA: Diagnosis not present

## 2015-11-08 ENCOUNTER — Ambulatory Visit: Payer: BLUE CROSS/BLUE SHIELD | Admitting: Neurology

## 2015-11-30 ENCOUNTER — Other Ambulatory Visit: Payer: Self-pay | Admitting: Internal Medicine

## 2015-12-14 DIAGNOSIS — N3010 Interstitial cystitis (chronic) without hematuria: Secondary | ICD-10-CM | POA: Diagnosis not present

## 2016-01-08 ENCOUNTER — Encounter: Payer: Self-pay | Admitting: Neurology

## 2016-02-05 ENCOUNTER — Other Ambulatory Visit: Payer: Self-pay | Admitting: Internal Medicine

## 2016-02-05 DIAGNOSIS — F411 Generalized anxiety disorder: Secondary | ICD-10-CM

## 2016-02-27 ENCOUNTER — Ambulatory Visit: Payer: BLUE CROSS/BLUE SHIELD | Admitting: Neurology

## 2016-03-11 DIAGNOSIS — Z809 Family history of malignant neoplasm, unspecified: Secondary | ICD-10-CM | POA: Diagnosis not present

## 2016-03-11 DIAGNOSIS — Z1283 Encounter for screening for malignant neoplasm of skin: Secondary | ICD-10-CM | POA: Diagnosis not present

## 2016-03-11 DIAGNOSIS — L700 Acne vulgaris: Secondary | ICD-10-CM | POA: Diagnosis not present

## 2016-03-21 DIAGNOSIS — N3011 Interstitial cystitis (chronic) with hematuria: Secondary | ICD-10-CM | POA: Diagnosis not present

## 2016-03-21 DIAGNOSIS — R823 Hemoglobinuria: Secondary | ICD-10-CM | POA: Diagnosis not present

## 2016-04-07 DIAGNOSIS — N3010 Interstitial cystitis (chronic) without hematuria: Secondary | ICD-10-CM | POA: Diagnosis not present

## 2016-04-11 ENCOUNTER — Ambulatory Visit: Payer: BLUE CROSS/BLUE SHIELD | Admitting: Neurology

## 2016-04-18 ENCOUNTER — Encounter: Payer: Self-pay | Admitting: Neurology

## 2016-04-18 ENCOUNTER — Ambulatory Visit (INDEPENDENT_AMBULATORY_CARE_PROVIDER_SITE_OTHER): Payer: BLUE CROSS/BLUE SHIELD | Admitting: Neurology

## 2016-04-18 VITALS — BP 116/70 | HR 82 | Ht 67.0 in | Wt 141.5 lb

## 2016-04-18 DIAGNOSIS — R413 Other amnesia: Secondary | ICD-10-CM

## 2016-04-18 DIAGNOSIS — F411 Generalized anxiety disorder: Secondary | ICD-10-CM | POA: Diagnosis not present

## 2016-04-18 NOTE — Patient Instructions (Signed)
Continue with cognitive behavioral therapy, meditation, exercise. Follow-up in 1 year, call for any changes.

## 2016-04-18 NOTE — Progress Notes (Signed)
NEUROLOGY FOLLOW UP OFFICE NOTE  Wayne SchmidtJohn Tzeng [id number]  HISTORY OF PRESENT ILLNESS: I had the pleasure of seeing Wayne Silva in follow-up in the neurology clinic on 04/18/2016.  The patient was last seen more than a year ago for worsening memory. His Neuropsychological evaluation did not find any cognitive disorder, and noted that his anxiety is likely the cause of subjective cognitive complaints. Over the past year, he reports that the year has been great. He did notice a few episodes while driving when all of a sudden he looks around and does not know where he is, then he is able to quickly reorient. He would still be going to his destination and would not be in a different place from where he was going. It only happens driving, he does not notice any other similar gaps at home or at work. He does endorse that it may be he is thinking of a lot in his brain, but did feel concerned when it occurred when he was close to home. He denies any staring/unresponsive episodes, no missed bills or medications. He writes down notes in a meeting and sometimes has to ask again, but feels that he would get so focused on one thing and misses the rest of the meeting. He feels this is related to anxiety, he continues to see his psychiatrist, but has also started Cognitive Behavioral therapy and feels it has significantly improved his anxiety, he is much more relaxed, but still has work to do. He does meditation and takes omega 3 fatty acid supplements, which he feels helps remember words. He denies any headaches, dizziness, diplopia, focal numbness/tingling/weakness.   HPI 08/23/2014: This is a [age] yo ambidextrous right-hand dominant man with a history of anxiety and asthma who presented in April 2016 for worsening memory. He has a family history of Alzheimer's disease in his father and paternal grandmother in their 7870s. He started noticing memory problems in his 6240s where he could not recall certain things. He  has noticed this worsened recently with difficulties multitasking, he would be doing something, think of another thing, and forget what he was doing initially. He would walk into a grocery and forget his groceries. One time he left his card in the card reader and another customer had to call him back. He would not recall a name unless he puts more effort in remembering it (he now puts it on a tablet). He has found that if he does this, he would remember better than he thought he would otherwise do. He would be driving and wonder where he is, then he is usually able to reorient himself easily. He forgets things all over the office, always leaving things around. He states this has not significantly affected his job. He has had problems pulling words up. He denies any missed bill payments, no difficulties with ADLs. He tried taking a supplement called CogniQ and did notice that he was able to pull up his words faster and "boost my memory," but the caffeine ingredient made him more irritable. He tried Building control surveyorVayacog which caused him to feel foggy.   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 He has generalized anxiety disorder and used to take Xanax. He has been on Zoloft since 2014 which takes the edge off, but he is "still pretty anxious."  He underwent Neuropsychological evaluation in 2016 with the following diagnosis:  Impression: Cognitive Diagnosis Deferred. Generalized Anxiety Disorder. Results of cognitive testing were largely within normal  limits but did reveal relative weaknesses on verbal memory tests at the level of initial encoding. The pattern of performances on memory tests suggests that he becomes overwhelmed by incoming information when it is presented to him for the first time, and putting pressure on himself naturally leads to reduced encoding. Clinical interview, as well as psychological testing, clearly demonstrate a clinically significant level of anxiety consistent with his  diagnosis of GAD. Based on the available data, it is my clinical opinion that the patient's subjective cognitive complaints, and relative weakness with encoding of new information shown on testing, are most likely attributable to his generalized anxiety disorder. Continued treatment of anxiety is warranted.   PAST MEDICAL HISTORY: Past Medical History:  Diagnosis Date  . Alcoholic (HCC)   . Anxiety   . Asthma   . Spastic bladder     MEDICATIONS: Current Outpatient Prescriptions on File Prior to Visit  Medication Sig Dispense Refill  . albuterol (VENTOLIN HFA) 108 (90 BASE) MCG/ACT inhaler Inhale 1-2 puffs into the lungs every 6 (six) hours as needed for wheezing or shortness of breath. 18 g 11  . CIALIS 20 MG tablet TAKE 1 TABLET BY ORAL ROUTE EVERY DAY  10  . ibuprofen (ADVIL,MOTRIN) 800 MG tablet TAKE 1 TABLET BY MOUTH EVERY 6 TO 8 HOURS AS NEEDED FOR PAIN  0  . Mometasone Furoate (ASMANEX HFA) 100 MCG/ACT AERO Inhale 1 Act into the lungs 2 times daily at 12 noon and 4 pm. 13 g 11  . montelukast (SINGULAIR) 10 MG tablet Take 1 tablet (10 mg total) by mouth at bedtime. 90 tablet 3  . montelukast (SINGULAIR) 10 MG tablet TAKE 1 TABLET BY MOUTH AT BEDTIME 90 tablet 3  . Multiple Vitamin (MULTIVITAMIN WITH MINERALS) TABS Take 1 tablet by mouth daily.    . Omega-3 Fatty Acids (FISH OIL PO) Take by mouth daily.    . pentosan polysulfate (ELMIRON) 100 MG capsule Take 1 capsule (100 mg total) by mouth 3 (three) times daily. 90 capsule 11  . sertraline (ZOLOFT) 50 MG tablet TAKE 1 TABLET BY MOUTH EVERY DAY 90 tablet 3  . valACYclovir (VALTREX) 1000 MG tablet Take 1 tablet by mouth daily.     No current facility-administered medications on file prior to visit.     ALLERGIES: No Known Allergies  FAMILY HISTORY: Family History  Problem Relation Age of Onset  . Colon cancer Father   . Prostate cancer Paternal Grandfather   . Skin cancer Mother   . Alzheimer's disease Father   .  Alzheimer's disease Maternal Grandmother     SOCIAL HISTORY: Social History   Social History  . Marital status: Single    Spouse name: N/A  . Number of children: N/A  . Years of education: N/A   Occupational History  . Not on file.   Social History Main Topics  . Smoking status: Former Smoker    Quit date: 08/22/2004  . Smokeless tobacco: Never Used     Comment: social smoker  . Alcohol use No     Comment: was an alcoholic (quit 10 years ago)  . Drug use: No  . Sexual activity: Yes    Partners: Female    Birth control/ protection: Condom   Other Topics Concern  . Not on file   Social History Narrative  . No narrative on file    REVIEW OF SYSTEMS: Constitutional: No fevers, chills, or sweats, no generalized fatigue, change in appetite Eyes: No visual changes, double  vision, eye pain Ear, nose and throat: No hearing loss, ear pain, nasal congestion, sore throat Cardiovascular: No chest pain, palpitations Respiratory:  No shortness of breath at rest or with exertion, wheezes GastrointestinaI: No nausea, vomiting, diarrhea, abdominal pain, fecal incontinence Genitourinary:  No dysuria, urinary retention or frequency Musculoskeletal:  No neck pain, +back pain Integumentary: No rash, pruritus, skin lesions Neurological: as above Psychiatric: No depression, insomnia,+ anxiety Endocrine: No palpitations, fatigue, diaphoresis, mood swings, change in appetite, change in weight, increased thirst Hematologic/Lymphatic:  No anemia, purpura, petechiae. Allergic/Immunologic: no itchy/runny eyes, nasal congestion, recent allergic reactions, rashes  PHYSICAL EXAM: Vitals:   04/18/16 0832  BP: 116/70  Pulse: 82   General: No acute distress Head:  Normocephalic/atraumatic Neck: supple, no paraspinal tenderness, full range of motion Heart:  Regular rate and rhythm Lungs:  Clear to auscultation bilaterally Back: No paraspinal tenderness Skin/Extremities: No rash, no  edema Neurological Exam: alert and oriented to person, place, and time. No aphasia or dysarthria. Fund of knowledge is appropriate.  Recent and remote memory are intact.  Attention and concentration are normal.    Able to name objects and repeat phrases.  Montreal Cognitive Assessment  04/18/2016 08/23/2014  Visuospatial/ Executive (0/5) 5 5  Naming (0/3) 3 3  Attention: Read list of digits (0/2) 2 2  Attention: Read list of letters (0/1) 1 1  Attention: Serial 7 subtraction starting at 100 (0/3) 3 3  Language: Repeat phrase (0/2) 2 2  Language : Fluency (0/1) 1 1  Abstraction (0/2) 2 2  Delayed Recall (0/5) 4 3  Orientation (0/6) 6 6  Total 29 28  Adjusted Score (based on education) - 28   Cranial nerves: Pupils equal, round, reactive to light.  Extraocular movements intact with no nystagmus. Visual fields full. Facial sensation intact. No facial asymmetry. Tongue, uvula, palate midline.  Motor: Bulk and tone normal, muscle strength 5/5 throughout with no pronator drift.  Sensation to light touch intact.  No extinction to double simultaneous stimulation.  Deep tendon reflexes 2+ throughout, toes downgoing.  Finger to nose testing intact.  Gait narrow-based and steady, able to tandem walk adequately.  Romberg negative.  IMPRESSION: This is a 55 yo RH man with a history of anxiety, family history of Alzheimer's disease in 2 family members, who presented with worsening memory. TSH, B12, RPR normal. MRI brain normal. Neuropsychological evaluation indicated that cognitive complaints and relative weakness with encoding new information is most likely due to generalized anxiety disorder. His MOCA score today is normal 29/30, he continues to recognize anxiety as a cause, and continues to work with CBT and his psychiatrist. We again discussed the importance of control of vascular risk factors, physical exercise, and brain stimulation exercises for brain health. He would like to continue monitoring memory due  to family history of Alzheimer's disease, and will follow-up in 1 year. He knows to call our office for any changes.   Thank you for allowing me to participate in his care.  Please do not hesitate to call for any questions or concerns.  The duration of this appointment visit was 25 minutes of face-to-face time with the patient.  Greater than 50% of this time was spent in counseling, explanation of diagnosis, planning of further management, and coordination of care.   Patrcia DollyKaren Shama Monfils, M.D.   CC: Dr. Yetta BarreJones

## 2016-07-11 DIAGNOSIS — N138 Other obstructive and reflux uropathy: Secondary | ICD-10-CM | POA: Diagnosis not present

## 2016-07-11 DIAGNOSIS — N401 Enlarged prostate with lower urinary tract symptoms: Secondary | ICD-10-CM | POA: Diagnosis not present

## 2016-07-11 DIAGNOSIS — N3010 Interstitial cystitis (chronic) without hematuria: Secondary | ICD-10-CM | POA: Diagnosis not present

## 2016-07-11 DIAGNOSIS — R358 Other polyuria: Secondary | ICD-10-CM | POA: Diagnosis not present

## 2016-07-24 ENCOUNTER — Encounter: Payer: BLUE CROSS/BLUE SHIELD | Admitting: Internal Medicine

## 2016-08-05 ENCOUNTER — Other Ambulatory Visit (INDEPENDENT_AMBULATORY_CARE_PROVIDER_SITE_OTHER): Payer: BLUE CROSS/BLUE SHIELD

## 2016-08-05 ENCOUNTER — Ambulatory Visit (INDEPENDENT_AMBULATORY_CARE_PROVIDER_SITE_OTHER): Payer: BLUE CROSS/BLUE SHIELD | Admitting: Internal Medicine

## 2016-08-05 ENCOUNTER — Telehealth: Payer: Self-pay | Admitting: Internal Medicine

## 2016-08-05 ENCOUNTER — Encounter: Payer: Self-pay | Admitting: Internal Medicine

## 2016-08-05 VITALS — BP 110/70 | HR 66 | Temp 97.8°F | Ht 67.0 in | Wt 141.5 lb

## 2016-08-05 DIAGNOSIS — Z0000 Encounter for general adult medical examination without abnormal findings: Secondary | ICD-10-CM | POA: Diagnosis not present

## 2016-08-05 LAB — URINALYSIS, ROUTINE W REFLEX MICROSCOPIC
Bilirubin Urine: NEGATIVE
Hgb urine dipstick: NEGATIVE
KETONES UR: NEGATIVE
Leukocytes, UA: NEGATIVE
NITRITE: NEGATIVE
PH: 6 (ref 5.0–8.0)
RBC / HPF: NONE SEEN (ref 0–?)
Specific Gravity, Urine: 1.01 (ref 1.000–1.030)
Total Protein, Urine: NEGATIVE
URINE GLUCOSE: NEGATIVE
Urobilinogen, UA: 0.2 (ref 0.0–1.0)
WBC UA: NONE SEEN (ref 0–?)

## 2016-08-05 LAB — COMPREHENSIVE METABOLIC PANEL
ALT: 11 U/L (ref 0–53)
AST: 18 U/L (ref 0–37)
Albumin: 4.5 g/dL (ref 3.5–5.2)
Alkaline Phosphatase: 58 U/L (ref 39–117)
BILIRUBIN TOTAL: 1 mg/dL (ref 0.2–1.2)
BUN: 20 mg/dL (ref 6–23)
CHLORIDE: 101 meq/L (ref 96–112)
CO2: 30 meq/L (ref 19–32)
CREATININE: 0.78 mg/dL (ref 0.40–1.50)
Calcium: 9.2 mg/dL (ref 8.4–10.5)
GFR: 109.83 mL/min (ref >60.00)
Glucose, Bld: 92 mg/dL (ref 70–99)
Potassium: 4.4 mEq/L (ref 3.5–5.1)
SODIUM: 137 meq/L (ref 135–145)
Total Protein: 6.8 g/dL (ref 6.0–8.3)

## 2016-08-05 LAB — LIPID PANEL
CHOL/HDL RATIO: 3
CHOLESTEROL: 153 mg/dL (ref 0–200)
HDL: 60.6 mg/dL (ref >39.00)
LDL CALC: 78 mg/dL (ref 0–99)
NonHDL: 92.77
TRIGLYCERIDES: 72 mg/dL (ref 0.0–149.0)
VLDL: 14.4 mg/dL (ref 0.0–40.0)

## 2016-08-05 LAB — CBC WITH DIFFERENTIAL/PLATELET
BASOS ABS: 0.1 10*3/uL (ref 0.0–0.1)
BASOS PCT: 1.3 % (ref 0.0–3.0)
EOS ABS: 0.1 10*3/uL (ref 0.0–0.7)
Eosinophils Relative: 2.6 % (ref 0.0–5.0)
HCT: 42.9 % (ref 39.0–52.0)
Hemoglobin: 14.6 g/dL (ref 13.0–17.0)
Lymphocytes Relative: 20.6 % (ref 12.0–46.0)
Lymphs Abs: 1.1 10*3/uL (ref 0.7–4.0)
MCHC: 33.9 g/dL (ref 30.0–36.0)
MCV: 90 fl (ref 78.0–100.0)
Monocytes Absolute: 0.5 10*3/uL (ref 0.1–1.0)
Monocytes Relative: 9.1 % (ref 3.0–12.0)
NEUTROS ABS: 3.7 10*3/uL (ref 1.4–7.7)
NEUTROS PCT: 66.4 % (ref 43.0–77.0)
PLATELETS: 205 10*3/uL (ref 150.0–400.0)
RBC: 4.77 Mil/uL (ref 4.22–5.81)
RDW: 12.6 % (ref 11.5–15.5)
WBC: 5.5 10*3/uL (ref 4.0–10.5)

## 2016-08-05 LAB — TSH: TSH: 1.06 u[IU]/mL (ref 0.35–4.50)

## 2016-08-05 LAB — PSA: PSA: 1.65 ng/mL (ref 0.10–4.00)

## 2016-08-05 NOTE — Progress Notes (Signed)
Pre visit review using our clinic review tool, if applicable. No additional management support is needed unless otherwise documented below in the visit note. 

## 2016-08-05 NOTE — Patient Instructions (Signed)
 Health Maintenance, Male A healthy lifestyle and preventive care is important for your health and wellness. Ask your health care provider about what schedule of regular examinations is right for you. What should I know about weight and diet?  Eat a Healthy Diet  Eat plenty of vegetables, fruits, whole grains, low-fat dairy products, and lean protein.  Do not eat a lot of foods high in solid fats, added sugars, or salt. Maintain a Healthy Weight  Regular exercise can help you achieve or maintain a healthy weight. You should:  Do at least 150 minutes of exercise each week. The exercise should increase your heart rate and make you sweat (moderate-intensity exercise).  Do strength-training exercises at least twice a week. Watch Your Levels of Cholesterol and Blood Lipids  Have your blood tested for lipids and cholesterol every 5 years starting at 55 years of age. If you are at high risk for heart disease, you should start having your blood tested when you are 55 years old. You may need to have your cholesterol levels checked more often if:  Your lipid or cholesterol levels are high.  You are older than 55 years of age.  You are at high risk for heart disease. What should I know about cancer screening? Many types of cancers can be detected early and may often be prevented. Lung Cancer  You should be screened every year for lung cancer if:  You are a current smoker who has smoked for at least 30 years.  You are a former smoker who has quit within the past 15 years.  Talk to your health care provider about your screening options, when you should start screening, and how often you should be screened. Colorectal Cancer  Routine colorectal cancer screening usually begins at 55 years of age and should be repeated every 5-10 years until you are 55 years old. You may need to be screened more often if early forms of precancerous polyps or small growths are found. Your health care provider  may recommend screening at an earlier age if you have risk factors for colon cancer.  Your health care provider may recommend using home test kits to check for hidden blood in the stool.  A small camera at the end of a tube can be used to examine your colon (sigmoidoscopy or colonoscopy). This checks for the earliest forms of colorectal cancer. Prostate and Testicular Cancer  Depending on your age and overall health, your health care provider may do certain tests to screen for prostate and testicular cancer.  Talk to your health care provider about any symptoms or concerns you have about testicular or prostate cancer. Skin Cancer  Check your skin from head to toe regularly.  Tell your health care provider about any new moles or changes in moles, especially if:  There is a change in a mole's size, shape, or color.  You have a mole that is larger than a pencil eraser.  Always use sunscreen. Apply sunscreen liberally and repeat throughout the day.  Protect yourself by wearing long sleeves, pants, a wide-brimmed hat, and sunglasses when outside. What should I know about heart disease, diabetes, and high blood pressure?  If you are 18-39 years of age, have your blood pressure checked every 3-5 years. If you are 40 years of age or older, have your blood pressure checked every year. You should have your blood pressure measured twice-once when you are at a hospital or clinic, and once when you are not at   a hospital or clinic. Record the average of the two measurements. To check your blood pressure when you are not at a hospital or clinic, you can use:  An automated blood pressure machine at a pharmacy.  A home blood pressure monitor.  Talk to your health care provider about your target blood pressure.  If you are between 45-79 years old, ask your health care provider if you should take aspirin to prevent heart disease.  Have regular diabetes screenings by checking your fasting blood sugar  level.  If you are at a normal weight and have a low risk for diabetes, have this test once every three years after the age of 45.  If you are overweight and have a high risk for diabetes, consider being tested at a younger age or more often.  A one-time screening for abdominal aortic aneurysm (AAA) by ultrasound is recommended for men aged 65-75 years who are current or former smokers. What should I know about preventing infection? Hepatitis B  If you have a higher risk for hepatitis B, you should be screened for this virus. Talk with your health care provider to find out if you are at risk for hepatitis B infection. Hepatitis C  Blood testing is recommended for:  Everyone born from 1945 through 1965.  Anyone with known risk factors for hepatitis C. Sexually Transmitted Diseases (STDs)  You should be screened each year for STDs including gonorrhea and chlamydia if:  You are sexually active and are younger than 55 years of age.  You are older than 55 years of age and your health care provider tells you that you are at risk for this type of infection.  Your sexual activity has changed since you were last screened and you are at an increased risk for chlamydia or gonorrhea. Ask your health care provider if you are at risk.  Talk with your health care provider about whether you are at high risk of being infected with HIV. Your health care provider may recommend a prescription medicine to help prevent HIV infection. What else can I do?  Schedule regular health, dental, and eye exams.  Stay current with your vaccines (immunizations).  Do not use any tobacco products, such as cigarettes, chewing tobacco, and e-cigarettes. If you need help quitting, ask your health care provider.  Limit alcohol intake to no more than 2 drinks per day. One drink equals 12 ounces of beer, 5 ounces of wine, or 1 ounces of hard liquor.  Do not use street drugs.  Do not share needles.  Ask your health  care provider for help if you need support or information about quitting drugs.  Tell your health care provider if you often feel depressed.  Tell your health care provider if you have ever been abused or do not feel safe at home. This information is not intended to replace advice given to you by your health care provider. Make sure you discuss any questions you have with your health care provider. Document Released: 10/25/2007 Document Revised: 12/26/2015 Document Reviewed: 01/30/2015 Elsevier Interactive Patient Education  2017 [employer]  

## 2016-08-05 NOTE — Progress Notes (Signed)
Subjective:  Patient ID: Wayne Silva, male    DOB: 03/08/1961  Age: 56 y.o. MRN: [id number]  CC: Annual Exam   HPI Wayne Silva presents for a CPX - He feels well and offers no complaints. He is treated for erectile dysfunction and kidney stones by a urologist. He has had no kidney stone symptoms over the last year.  Outpatient Medications Prior to Visit  Medication Sig Dispense Refill  . albuterol (VENTOLIN HFA) 108 (90 BASE) MCG/ACT inhaler Inhale 1-2 puffs into the lungs every 6 (six) hours as needed for wheezing or shortness of breath. 18 g 11  . CIALIS 20 MG tablet TAKE 1 TABLET BY ORAL ROUTE EVERY DAY  10  . Mometasone Furoate (ASMANEX HFA) 100 MCG/ACT AERO Inhale 1 Act into the lungs 2 times daily at 12 noon and 4 pm. 13 g 11  . montelukast (SINGULAIR) 10 MG tablet Take 1 tablet (10 mg total) by mouth at bedtime. 90 tablet 3  . Omega-3 Fatty Acids (FISH OIL PO) Take by mouth daily.    . pentosan polysulfate (ELMIRON) 100 MG capsule Take 1 capsule (100 mg total) by mouth 3 (three) times daily. 90 capsule 11  . sertraline (ZOLOFT) 50 MG tablet TAKE 1 TABLET BY MOUTH EVERY DAY 90 tablet 3  . valACYclovir (VALTREX) 1000 MG tablet Take 1 tablet by mouth daily.    Marland Kitchen ibuprofen (ADVIL,MOTRIN) 800 MG tablet TAKE 1 TABLET BY MOUTH EVERY 6 TO 8 HOURS AS NEEDED FOR PAIN  0  . montelukast (SINGULAIR) 10 MG tablet TAKE 1 TABLET BY MOUTH AT BEDTIME 90 tablet 3  . Multiple Vitamin (MULTIVITAMIN WITH MINERALS) TABS Take 1 tablet by mouth daily.     No facility-administered medications prior to visit.     ROS Review of Systems  All other systems reviewed and are negative.   Objective:  BP 110/70 (BP Location: Left Arm, Patient Position: Sitting, Cuff Size: Normal)   Pulse 66   Temp 97.8 F (36.6 C) (Oral)   Ht 5\' 7"  (1.702 m)   Wt 141 lb 8 oz (64.2 kg)   SpO2 98%   BMI 22.16 kg/m   BP Readings from Last 3 Encounters:  08/05/16 110/70  04/18/16 116/70  07/10/15 100/70    Wt  Readings from Last 3 Encounters:  08/05/16 141 lb 8 oz (64.2 kg)  04/18/16 141 lb 8 oz (64.2 kg)  07/10/15 134 lb (60.8 kg)    Physical Exam  Constitutional: He is oriented to person, place, and time. No distress.  HENT:  Mouth/Throat: Oropharynx is clear and moist. No oropharyngeal exudate.  Eyes: Conjunctivae are normal. Right eye exhibits no discharge. Left eye exhibits no discharge. No scleral icterus.  Neck: Normal range of motion. Neck supple. No JVD present. No tracheal deviation present. No thyromegaly present.  Cardiovascular: Normal rate, regular rhythm, normal heart sounds and intact distal pulses.  Exam reveals no gallop and no friction rub.   No murmur heard. Pulmonary/Chest: Effort normal and breath sounds normal. No stridor. No respiratory distress. He has no wheezes. He has no rales. He exhibits no tenderness.  Abdominal: Soft. Bowel sounds are normal. He exhibits no distension and no mass. There is no tenderness. There is no rebound and no guarding. Hernia confirmed negative in the right inguinal area and confirmed negative in the left inguinal area.  Genitourinary: Rectum normal, prostate normal, testes normal and penis normal. Rectal exam shows no external hemorrhoid, no internal hemorrhoid, no fissure, no mass,  no tenderness, anal tone normal and guaiac negative stool. Prostate is not enlarged and not tender. Right testis shows no mass, no swelling and no tenderness. Right testis is descended. Cremasteric reflex is not absent on the right side. Left testis shows no mass, no swelling and no tenderness. Left testis is descended. Cremasteric reflex is not absent on the left side. Circumcised. No penile erythema or penile tenderness. No discharge found.  Musculoskeletal: Normal range of motion. He exhibits no edema or tenderness.  Lymphadenopathy:    He has no cervical adenopathy.       Right: No inguinal adenopathy present.       Left: No inguinal adenopathy present.    Neurological: He is oriented to person, place, and time.  Skin: Skin is warm and dry. No rash noted. He is not diaphoretic. No erythema. No pallor.  Psychiatric: He has a normal mood and affect. His behavior is normal. Judgment and thought content normal.  Vitals reviewed.   Lab Results  Component Value Date   WBC 5.5 08/05/2016   HGB 14.6 08/05/2016   HCT 42.9 08/05/2016   PLT 205.0 08/05/2016   GLUCOSE 92 08/05/2016   CHOL 153 08/05/2016   TRIG 72.0 08/05/2016   HDL 60.60 08/05/2016   LDLCALC 78 08/05/2016   ALT 11 08/05/2016   AST 18 08/05/2016   NA 137 08/05/2016   K 4.4 08/05/2016   CL 101 08/05/2016   CREATININE 0.78 08/05/2016   BUN 20 08/05/2016   CO2 30 08/05/2016   TSH 1.06 08/05/2016   PSA 1.65 08/05/2016    [name] Contrast  Result Date: 10/03/2014 CLINICAL DATA:  56 year old male with memory loss ongoing for 5 years. Initial encounter. EXAM: MRI HEAD WITHOUT CONTRAST TECHNIQUE: Multiplanar, multiecho pulse sequences of the brain and surrounding structures were obtained without intravenous contrast. COMPARISON:  None. FINDINGS: No acute infarct. No intracranial hemorrhage. No hydrocephalus or advanced atrophy. No intracranial mass lesion noted on this unenhanced exam. Major intracranial vascular structures are patent. Partial opacification right mastoid air cells without obstructing lesion of the eustachian tube noted. Minimal mucosal thickening ethmoid sinus air cells and maxillary sinuses. Cervical medullary junction, pituitary region, pineal region and orbital structures unremarkable. IMPRESSION: Partial opacification right mastoid air cells otherwise negative unenhanced MR brain as noted above. Electronically Signed   By: Lacy DuverneySteven  Olson M.D.   On: 10/03/2014 14:04    Assessment & Plan:   Wayne RuizJohn was seen today for annual exam.  Diagnoses and all orders for this visit:  Routine general medical examination at a health care facility- exam completed, labs ordered  and reviewed, vaccines reviewed and updated, his colonoscopy is up-to-date, patient education material was given. -     Lipid panel; Future -     Comprehensive metabolic panel; Future -     CBC with Differential/Platelet; Future -     PSA; Future -     TSH; Future -     Urinalysis, Routine w reflex microscopic; Future   I have discontinued Wayne Silva's multivitamin with minerals and ibuprofen. I am also having him maintain his pentosan polysulfate, valACYclovir, Omega-3 Fatty Acids (FISH OIL PO), montelukast, albuterol, CIALIS, Mometasone Furoate, and sertraline.  No orders of the defined types were placed in this encounter.    Follow-up: Return if symptoms worsen or fail to improve.  Sanda Lingerhomas Jones, MD

## 2016-08-05 NOTE — Telephone Encounter (Signed)
This has been updated in the patients chart.

## 2016-08-05 NOTE — Telephone Encounter (Signed)
Pt called in and just wanted left dr Yetta Barrejones know that his fathers father had pancreatic cancer

## 2016-08-05 NOTE — Telephone Encounter (Signed)
sure

## 2016-08-25 ENCOUNTER — Telehealth: Payer: Self-pay | Admitting: Internal Medicine

## 2016-08-25 NOTE — Telephone Encounter (Signed)
LVM for pt to call back as soon as possible.   RE: Need to know if he is wanting another medication or if he would like to be seen.

## 2016-08-25 NOTE — Telephone Encounter (Signed)
Patient states that Dr. Yetta Barre mentioned to him about getting off zoloft.  Patient states he has stopped taking this medication and has been off of it for a week.  Requesting call back in regard.  States he does have mild symptoms of irritability and dizziness.

## 2016-10-17 DIAGNOSIS — N3010 Interstitial cystitis (chronic) without hematuria: Secondary | ICD-10-CM | POA: Diagnosis not present

## 2016-12-02 ENCOUNTER — Encounter: Payer: Self-pay | Admitting: Internal Medicine

## 2016-12-02 ENCOUNTER — Ambulatory Visit (INDEPENDENT_AMBULATORY_CARE_PROVIDER_SITE_OTHER): Payer: BLUE CROSS/BLUE SHIELD | Admitting: Internal Medicine

## 2016-12-02 VITALS — BP 110/70 | HR 64 | Temp 97.7°F | Resp 16 | Ht 67.0 in | Wt 138.0 lb

## 2016-12-02 DIAGNOSIS — M7712 Lateral epicondylitis, left elbow: Secondary | ICD-10-CM | POA: Diagnosis not present

## 2016-12-02 MED ORDER — IBUPROFEN 600 MG PO TABS: 600.0000 mg | ORAL_TABLET | Freq: Three times a day (TID) | ORAL | 2 refills | Status: DC | PRN

## 2016-12-02 NOTE — Progress Notes (Signed)
Pre visit review using our clinic review tool, if applicable. No additional management support is needed unless otherwise documented below in the visit note. 

## 2016-12-02 NOTE — Progress Notes (Signed)
Subjective:  Patient ID: Wayne Silva, male    DOB: 1961-05-07  Age: 56 y.o. MRN: [id number]  CC: Elbow Injury   HPI Wayne Silva presents for a 2 month hx of L elbow pain, he denies a recent injury but does a lot of repetitive activity with weight-lifting and biking. He has not treated this.  Outpatient Medications Prior to Visit  Medication Sig Dispense Refill  . albuterol (VENTOLIN HFA) 108 (90 BASE) MCG/ACT inhaler Inhale 1-2 puffs into the lungs every 6 (six) hours as needed for wheezing or shortness of breath. 18 g 11  . CIALIS 20 MG tablet TAKE 1 TABLET BY ORAL ROUTE EVERY DAY  10  . Mometasone Furoate (ASMANEX HFA) 100 MCG/ACT AERO Inhale 1 Act into the lungs 2 times daily at 12 noon and 4 pm. 13 g 11  . montelukast (SINGULAIR) 10 MG tablet Take 1 tablet (10 mg total) by mouth at bedtime. 90 tablet 3  . Omega-3 Fatty Acids (FISH OIL PO) Take by mouth daily.    . valACYclovir (VALTREX) 1000 MG tablet Take 1 tablet by mouth daily.    . pentosan polysulfate (ELMIRON) 100 MG capsule Take 1 capsule (100 mg total) by mouth 3 (three) times daily. 90 capsule 11  . sertraline (ZOLOFT) 50 MG tablet TAKE 1 TABLET BY MOUTH EVERY DAY (Patient not taking: Reported on 12/02/2016) 90 tablet 3   No facility-administered medications prior to visit.     ROS Review of Systems  Musculoskeletal: Positive for arthralgias.  Neurological: Negative for weakness and numbness.  Hematological: Negative for adenopathy. Does not bruise/bleed easily.    Objective:  BP 110/70 (BP Location: Right Arm, Patient Position: Sitting, Cuff Size: Normal)   Pulse 64   Temp 97.7 F (36.5 C) (Oral)   Resp 16   Ht 5\' 7"  (1.702 m)   Wt 138 lb (62.6 kg)   SpO2 99%   BMI 21.61 kg/m   BP Readings from Last 3 Encounters:  12/02/16 110/70  08/05/16 110/70  04/18/16 116/70    Wt Readings from Last 3 Encounters:  12/02/16 138 lb (62.6 kg)  08/05/16 141 lb 8 oz (64.2 kg)  04/18/16 141 lb 8 oz (64.2 kg)     Physical Exam  Musculoskeletal:       Left elbow: He exhibits normal range of motion, no swelling, no effusion and no deformity. Tenderness found. Lateral epicondyle tenderness noted. No radial head, no medial epicondyle and no olecranon process tenderness noted.    Lab Results  Component Value Date   WBC 5.5 08/05/2016   HGB 14.6 08/05/2016   HCT 42.9 08/05/2016   PLT 205.0 08/05/2016   GLUCOSE 92 08/05/2016   CHOL 153 08/05/2016   TRIG 72.0 08/05/2016   HDL 60.60 08/05/2016   LDLCALC 78 08/05/2016   ALT 11 08/05/2016   AST 18 08/05/2016   NA 137 08/05/2016   K 4.4 08/05/2016   CL 101 08/05/2016   CREATININE 0.78 08/05/2016   BUN 20 08/05/2016   CO2 30 08/05/2016   TSH 1.06 08/05/2016   PSA 1.65 08/05/2016    [name] Contrast  Result Date: 10/03/2014 CLINICAL DATA:  56 year old male with memory loss ongoing for 5 years. Initial encounter. EXAM: MRI HEAD WITHOUT CONTRAST TECHNIQUE: Multiplanar, multiecho pulse sequences of the brain and surrounding structures were obtained without intravenous contrast. COMPARISON:  None. FINDINGS: No acute infarct. No intracranial hemorrhage. No hydrocephalus or advanced atrophy. No intracranial mass lesion noted on this unenhanced  exam. Major intracranial vascular structures are patent. Partial opacification right mastoid air cells without obstructing lesion of the eustachian tube noted. Minimal mucosal thickening ethmoid sinus air cells and maxillary sinuses. Cervical medullary junction, pituitary region, pineal region and orbital structures unremarkable. IMPRESSION: Partial opacification right mastoid air cells otherwise negative unenhanced MR brain as noted above. Electronically Signed   By: Lacy DuverneySteven  Olson M.D.   On: 10/03/2014 14:04    Assessment & Plan:   Wayne RuizJohn was seen today for elbow injury.  Diagnoses and all orders for this visit:  Lateral epicondylitis of left elbow- will start an NSAID, he will rest and ice, I think he would  benefit from doing OT -     Ambulatory referral to Occupational Therapy -     ibuprofen (ADVIL,MOTRIN) 600 MG tablet; Take 1 tablet (600 mg total) by mouth every 8 (eight) hours as needed.   I have discontinued Wayne Silva's pentosan polysulfate and sertraline. I am also having him start on ibuprofen. Additionally, I am having him maintain his valACYclovir, Omega-3 Fatty Acids (FISH OIL PO), montelukast, albuterol, CIALIS, and Mometasone Furoate.  Meds ordered this encounter  Medications  . ibuprofen (ADVIL,MOTRIN) 600 MG tablet    Sig: Take 1 tablet (600 mg total) by mouth every 8 (eight) hours as needed.    Dispense:  90 tablet    Refill:  2     Follow-up: Return if symptoms worsen or fail to improve.  Sanda Lingerhomas Sherley Leser, MD

## 2016-12-02 NOTE — Patient Instructions (Signed)
Tennis Elbow Tennis elbow (lateral epicondylitis) is inflammation of the outer tendons of your forearm close to your elbow. Your tendons attach your muscles to your bones. The outer tendons of your forearm are used to extend your wrist, and they attach on the outside part of your elbow. Tennis elbow is often found in people who play tennis, but anyone may get the condition from repeatedly extending the wrist or turning the forearm. What are the causes? This condition is caused by repeatedly extending your wrist and using your hands. It can result from sports or work that requires repetitive forearm movements. Tennis elbow may also be caused by an injury. What increases the risk? You have a higher risk of developing tennis elbow if you play tennis or another racquet sport. You also have a higher risk if you frequently use your hands for work. This condition is also more likely to develop in:  Musicians.  Carpenters, painters, and plumbers.  Cooks.  Cashiers.  People who work in factories.  Construction workers.  Butchers.  People who use computers.  What are the signs or symptoms? Symptoms of this condition include:  Pain and tenderness in your forearm and the outer part of your elbow. You may only feel the pain when you use your arm, or you may feel it even when you are not using your arm.  A burning feeling that runs from your elbow through your arm.  Weak grip in your hands.  How is this diagnosed? This condition may be diagnosed by medical history and physical exam. You may also have other tests, including:  X-rays.  MRI.  How is this treated? Your health care provider will recommend lifestyle adjustments, such as resting and icing your arm. Treatment may also include:  Medicines for inflammation. This may include shots of cortisone if your pain continues.  Physical therapy. This may include massage or exercises.  An elbow brace.  Surgery may eventually be  recommended if your pain does not go away with treatment. Follow these instructions at home: Activity  Rest your elbow and wrist as directed by your health care provider. Try to avoid any activities that caused the problem until your health care provider says that you can do them again.  If a physical therapist teaches you exercises, do all of them as directed.  If you lift an object, lift it with your palm facing upward. This lowers the stress on your elbow. Lifestyle  If your tennis elbow is caused by sports, check your equipment and make sure that: ? You are using it correctly. ? It is the best fit for you.  If your tennis elbow is caused by work, take breaks frequently, if you are able. Talk with your manager about how to best perform tasks in a way that is safe. ? If your tennis elbow is caused by computer use, talk with your manager about any changes that can be made to your work environment. General instructions  If directed, apply ice to the painful area: ? Put ice in a plastic bag. ? Place a towel between your skin and the bag. ? Leave the ice on for 20 minutes, 2-3 times per day.  Take medicines only as directed by your health care provider.  If you were given a brace, wear it as directed by your health care provider.  Keep all follow-up visits as directed by your health care provider. This is important. Contact a health care provider if:  Your pain does not   get better with treatment.  Your pain gets worse.  You have numbness or weakness in your forearm, hand, or fingers. This information is not intended to replace advice given to you by your health care provider. Make sure you discuss any questions you have with your health care provider. Document Released: 04/28/2005 Document Revised: 12/27/2015 Document Reviewed: 04/24/2014 Elsevier Interactive Patient Education  2018 Elsevier Inc.  

## 2016-12-16 ENCOUNTER — Other Ambulatory Visit: Payer: Self-pay | Admitting: Internal Medicine

## 2016-12-25 ENCOUNTER — Ambulatory Visit: Payer: BLUE CROSS/BLUE SHIELD | Attending: Internal Medicine | Admitting: Occupational Therapy

## 2016-12-25 DIAGNOSIS — M25522 Pain in left elbow: Secondary | ICD-10-CM | POA: Insufficient documentation

## 2016-12-25 DIAGNOSIS — M6281 Muscle weakness (generalized): Secondary | ICD-10-CM | POA: Insufficient documentation

## 2016-12-25 NOTE — Therapy (Signed)
[facility] [address] [place], [place], [address] Phone: [phone]   Fax:  [phone]  Occupational Therapy Evaluation  Patient Details  Name: Wayne Silva MRN: [id number] Date of Birth: 06/06/1960 Referring Provider: Dr. Sanda Linger  Encounter Date: 12/25/2016      OT End of Session - 12/25/16 1531    Visit Number 1   Number of Visits 15   Date for OT Re-Evaluation 02/23/17   Authorization Type BC/BS   OT Start Time 1430   OT Stop Time 1525   OT Time Calculation (min) 55 min   Activity Tolerance Patient tolerated treatment well      Past Medical History:  Diagnosis Date  . Alcoholic (HCC)   . Anxiety   . Asthma   . Spastic bladder     Past Surgical History:  Procedure Laterality Date  . HEMORRHOID SURGERY    . KIDNEY STONE SURGERY      There were no vitals filed for this visit.      Subjective Assessment - 12/25/16 1440    Pertinent History asthma   Patient Stated Goals get this arm so there's no pain, and get back to leisure activities w/o exacerbating it   Currently in Pain? Yes   Pain Score 0-No pain  up to 7/10   Pain Location Elbow   Pain Orientation Left   Pain Descriptors / Indicators Sore   Pain Type Acute pain   Pain Onset More than a month ago  over 2 months ago   Pain Frequency Intermittent   Aggravating Factors  physical activity, work related tasks           [facility] OT Assessment - 12/25/16 0001      Assessment   Diagnosis lateral epincondylitis Lt elbow   Referring Provider Dr. Sanda Linger   Onset Date --  approx. 2 months ago (after lifting weights)    Prior Therapy none     Precautions   Precautions None     Restrictions   Weight Bearing Restrictions No     Balance Screen   Has the patient fallen in the past 6 months No   Has the patient had a decrease in activity level because of a fear of falling?  No   Is the patient reluctant to leave their home  because of a fear of falling?  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   Leisure fishing, dancing, biking     ADL   Eating/Feeding Independent   Grooming Independent   Upper Body Bathing Independent   Lower Body Bathing Independent   Retail buyer - Conservator, museum/gallery -  Tour manager Independent     IADL   Shopping Takes care of all shopping needs independently   Light Housekeeping --  compensates by using RUE as much as able   Meal Prep Plans, prepares and serves adequate meals independently  using RT dominant hand more   Community Mobility Drives own vehicle   Medication Management Is responsible for taking medication in correct dosages at correct time     Mobility   Mobility Status Independent  Written Expression   Dominant Hand Right   Handwriting --  denies change     Observation/Other Assessments   Observations pain with active and resistive wrist extension. Point tender over lateral epicondyle     Sensation   Additional Comments denies change     Coordination   Fine Motor Movements are Fluid and Coordinated Yes   Coordination intact, slower with pain for in hand manipulation tasks Lt hand     Edema   Edema none     ROM / Strength   AROM / PROM / Strength AROM     AROM   Overall AROM Comments BUE AROM WNL's     Hand Function   Right Hand Grip (lbs) 103 lbs   Right Hand Lateral Pinch 23 lbs   Right Hand 3 Point Pinch 18 lbs   Left Hand Grip (lbs) 80 lbs   Left Hand Lateral Pinch 25 lbs   Left 3 point pinch 16 lbs                  OT Treatments/Exercises (OP) - 12/25/16 0001      ADLs   ADL Comments Pt instructed in pain management  strategies including use of modalities (heat, ice), massage, splints, proper work station positioning. Pt issued Phase I and phase II of lateral epicondylitis protocol      Exercises   Exercises Wrist     Wrist Exercises   Other wrist exercises active stretching with elbow flexed, wrist flexed with forearm neutral, then pronated, followed by elbow extension with forearm neutral (#1-3 for active stretches in phase II)      Splinting   Splinting Pt issued air filled lateral epicondylitis arm band and pre-fab wrist cock up splint. Reviewed wear and care of each               OT Education - 12/25/16 1529    Education provided Yes   Education Details heat/ice modalities, massage, splint wear and care, Phase I and II of lateral epicondylitis protocol   Person(s) Educated Patient   Methods Explanation;Demonstration  handouts for phase I and II provided (refer to [place] hand protocol)    Comprehension Verbalized understanding;Returned demonstration          OT Short Term Goals - 12/25/16 1630      OT SHORT TERM GOAL #1   Title Pt to verbalize understanding with all pain reduction strategies including: modalities, splinting, task modifications, and A/E needs   Time 4   Period Weeks   Status On-going   Target Date 02/23/17     OT SHORT TERM GOAL #2   Title Pt to be independent with splint wear and care   Time 4   Period Weeks   Status On-going           OT Long Term Goals - 12/25/16 1633      OT LONG TERM GOAL #1   Title Independent with updated HEP    Time 8   Period Weeks   Status New   Target Date 02/23/17     OT LONG TERM GOAL #2   Title Pt to report pain less than or equal to 3/10 with work related tasks and leisure activities   Time 8   Period Weeks   Status New     OT LONG TERM GOAL #3   Title Grip strength Lt hand to increase to 90 lbs or greater   Baseline eval = 80 lbs (Rt =  103 lbs)   Time 8   Period Weeks   Status New                Plan - 12/25/16 1533    Clinical Impression Statement Pt is a 56 y.o. male who presents to outpatient rehab with lateral epicondylitis Lt elbow. Pt has pain at lateral epicondyle with wrist extension. Pt also demo decr. grip strength Lt hand.    Occupational Profile and client history currently impacting functional performance pain limiting pt's ability to perform work tasks and leisure activities   Occupational performance deficits (Please refer to evaluation for details): IADL's;Work;Leisure   Rehab Potential Good   OT Frequency 1x / week   OT Duration --  for 3 weeks, then increase to 2x/wk for up to 6 weeks (when pt returns from vacation for iontophoresis)   OT Treatment/Interventions Self-care/ADL training;Moist Heat;Fluidtherapy;DME and/or AE instruction;Splinting;Patient/family education;Contrast Bath;Compression bandaging;Therapeutic exercises;Ultrasound;Therapeutic activities;Scar mobilization;Iontophoresis;Cryotherapy;Passive range of motion;Parrafin;Manual Therapy   Plan ultrasound, massage, review of recommendations/HEP and add 4th active stretch if tolerated. Pt to come 1x/wk for next 2 weeks, then goes on vacation for 2 weeks - will request iontophoresis order for when pt returns from vacation   Clinical Decision Making Limited treatment options, no task modification necessary   Consulted and Agree with Plan of Care Patient      Patient will benefit from skilled therapeutic intervention in order to improve the following deficits and impairments:     Visit Diagnosis: Pain in left elbow - Plan: Ot plan of care cert/re-cert  Muscle weakness (generalized) - Plan: Ot plan of care cert/re-cert    Problem List Patient Active Problem List   Diagnosis Date Noted  . Lateral epicondylitis of left elbow 12/02/2016  . MCI (mild cognitive impairment) 05/01/2014  . Cough variant asthma 05/01/2014  . Routine general medical examination at a health care facility 08/26/2013  . IC  (interstitial cystitis) 08/26/2013  . GAD (generalized anxiety disorder) 03/02/2013    Kelli ChurnBallie, Brevan Luberto Johnson, OTR/L 12/25/2016, 4:37 PM  [facility] [address] [place], [place], [address] Phone: [phone]   Fax:  [phone]  Name: Drucilla SchmidtJohn Spagnoli MRN: [id number] Date of Birth: [birth year]

## 2016-12-30 ENCOUNTER — Ambulatory Visit: Payer: BLUE CROSS/BLUE SHIELD | Admitting: Occupational Therapy

## 2016-12-30 DIAGNOSIS — M6281 Muscle weakness (generalized): Secondary | ICD-10-CM

## 2016-12-30 DIAGNOSIS — M25522 Pain in left elbow: Secondary | ICD-10-CM | POA: Diagnosis not present

## 2016-12-30 NOTE — Therapy (Signed)
[facility] [address] [place], [place], [address] Phone: [phone]   Fax:  [phone]  Occupational Therapy Treatment  Patient Details  Name: Wayne Silva MRN: [id number] Date of Birth: [birth year] Referring Provider: Dr. Sanda Linger  Encounter Date: 12/30/2016      OT End of Session - 12/30/16 0807    Visit Number 2   Number of Visits 15   Date for OT Re-Evaluation 02/23/17   Authorization Type BC/BS   OT Start Time 0805   OT Stop Time 0845   OT Time Calculation (min) 40 min   Activity Tolerance Patient tolerated treatment well   Behavior During Therapy [facility] for tasks assessed/performed      Past Medical History:  Diagnosis Date  . Alcoholic (HCC)   . Anxiety   . Asthma   . Spastic bladder     Past Surgical History:  Procedure Laterality Date  . HEMORRHOID SURGERY    . KIDNEY STONE SURGERY      There were no vitals filed for this visit.      Subjective Assessment - 12/30/16 0805    Pertinent History asthma   Patient Stated Goals get this arm so there's no pain, and get back to leisure activities w/o exacerbating it   Currently in Pain? Yes   Pain Score 6    Pain Location Elbow   Pain Orientation Left   Pain Descriptors / Indicators Sore   Pain Type Acute pain   Pain Onset More than a month ago   Pain Frequency Intermittent   Aggravating Factors  use   Pain Relieving Factors ice   Multiple Pain Sites No          Treatment: Ultrasound to left lateral forearm , 0.8 w/cm 2, 20% x 8 mins, no adverse reactions. Gentle massage to elbow in circular pattern , across and along muscle. Pt reports he has not been performing consistently at home. Reviewed previous exercises and added Phase III exercises. Pt was instructed that if he has any increased pain with new exercises to discontinue them. Ice pack x 8 mins to left forearm end of session, no adverse  reactions.                     OT Education - 12/30/16 [phone]    Education provided Yes   Education Details phase II exercise review, phase III exercises 1-4   Person(s) Educated Patient   Methods Explanation;Demonstration;Verbal cues;Handout   Comprehension Verbalized understanding;Returned demonstration;Verbal cues required          OT Short Term Goals - 12/25/16 1630      OT SHORT TERM GOAL #1   Title Pt to verbalize understanding with all pain reduction strategies including: modalities, splinting, task modifications, and A/E needs   Time 4   Period Weeks   Status On-going   Target Date 02/23/17     OT SHORT TERM GOAL #2   Title Pt to be independent with splint wear and care   Time 4   Period Weeks   Status On-going           OT Long Term Goals - 12/25/16 1633      OT LONG TERM GOAL #1   Title Independent with updated HEP    Time 8   Period Weeks   Status New   Target Date 02/23/17     OT LONG TERM GOAL #2   Title Pt to report pain less  than or equal to 3/10 with work related tasks and leisure activities   Time 8   Period Weeks   Status New     OT LONG TERM GOAL #3   Title Grip strength Lt hand to increase to 90 lbs or greater   Baseline eval = 80 lbs (Rt = 103 lbs)   Time 8   Period Weeks   Status New               Plan - 12/30/16 7530    Clinical Impression Statement Pt is progressing towards goals. He demonstrates understanding of updates to HEP.   Rehab Potential Good   OT Frequency 1x / week   OT Duration --  for 3 weeks then 2x week x 6 weeks   OT Treatment/Interventions Self-care/ADL training;Moist Heat;Fluidtherapy;DME and/or AE instruction;Splinting;Patient/family education;Contrast Bath;Compression bandaging;Therapeutic exercises;Ultrasound;Therapeutic activities;Scar mobilization;Iontophoresis;Cryotherapy;Passive range of motion;Parrafin;Manual Therapy   Plan reveiw phase II and phase III exercises, request ionto  order   Consulted and Agree with Plan of Care Patient      Patient will benefit from skilled therapeutic intervention in order to improve the following deficits and impairments:  Decreased strength, Pain, Impaired UE functional use, Decreased activity tolerance, Decreased endurance, Decreased range of motion  Visit Diagnosis: Pain in left elbow  Muscle weakness (generalized)    Problem List Patient Active Problem List   Diagnosis Date Noted  . Lateral epicondylitis of left elbow 12/02/2016  . MCI (mild cognitive impairment) 05/01/2014  . Cough variant asthma 05/01/2014  . Routine general medical examination at a health care facility 08/26/2013  . IC (interstitial cystitis) 08/26/2013  . GAD (generalized anxiety disorder) 03/02/2013    Lenise Jr 12/30/2016, 10:43 AM  [facility] [address] [place], [place], [address] Phone: [phone]   Fax:  [phone]  Name: Wayne Silva MRN: [id number] Date of Birth: June 06, 1960

## 2017-01-07 ENCOUNTER — Telehealth: Payer: Self-pay | Admitting: Occupational Therapy

## 2017-01-07 ENCOUNTER — Ambulatory Visit: Payer: BLUE CROSS/BLUE SHIELD | Admitting: Occupational Therapy

## 2017-01-07 DIAGNOSIS — M25522 Pain in left elbow: Secondary | ICD-10-CM | POA: Diagnosis not present

## 2017-01-07 DIAGNOSIS — M6281 Muscle weakness (generalized): Secondary | ICD-10-CM

## 2017-01-07 NOTE — Telephone Encounter (Signed)
I have signed this necounter

## 2017-01-07 NOTE — Therapy (Signed)
[facility] [address] [place], [place], [address] Phone: [phone]   Fax:  [phone]  Occupational Therapy Treatment  Patient Details  Name: Wayne Silva MRN: [id number] Date of Birth: 04-27-1961 Referring Provider: Dr. Sanda Linger  Encounter Date: 01/07/2017      OT End of Session - 01/07/17 1203    Visit Number 3   Number of Visits 15   Date for OT Re-Evaluation 02/23/17   Authorization Type BC/BS   OT Start Time 0847   OT Stop Time 0930   OT Time Calculation (min) 43 min   Activity Tolerance Patient tolerated treatment well      Past Medical History:  Diagnosis Date  . Alcoholic (HCC)   . Anxiety   . Asthma   . Spastic bladder     Past Surgical History:  Procedure Laterality Date  . HEMORRHOID SURGERY    . KIDNEY STONE SURGERY      There were no vitals filed for this visit.      Subjective Assessment - 01/07/17 0854    Subjective  The pain fluctuates   Pertinent History asthma   Patient Stated Goals get this arm so there's no pain, and get back to leisure activities w/o exacerbating it   Currently in Pain? Yes   Pain Score 6    Pain Location Elbow   Pain Orientation Left   Pain Descriptors / Indicators Sore   Pain Type Acute pain   Pain Onset More than a month ago   Pain Frequency Intermittent   Aggravating Factors  use   Pain Relieving Factors ice, wrist brace                      OT Treatments/Exercises (OP) - 01/07/17 0001      Wrist Exercises   Other wrist exercises Reviewed active and passive stretching ex's from phase II and phase III of lateral epicondylitis program     Modalities   Modalities Ultrasound     Ultrasound   Ultrasound Location lateral forearm near elbow   Ultrasound Parameters 3 Mhz, 0.8 wts/cm2, 20% pulsed x 8 min.    Ultrasound Goals Pain     Manual Therapy   Manual Therapy Soft tissue mobilization   Manual therapy comments along  wrist extensor musculature                  OT Short Term Goals - 12/25/16 1630      OT SHORT TERM GOAL #1   Title Pt to verbalize understanding with all pain reduction strategies including: modalities, splinting, task modifications, and A/E needs   Time 4   Period Weeks   Status On-going   Target Date 02/23/17     OT SHORT TERM GOAL #2   Title Pt to be independent with splint wear and care   Time 4   Period Weeks   Status On-going           OT Long Term Goals - 12/25/16 1633      OT LONG TERM GOAL #1   Title Independent with updated HEP    Time 8   Period Weeks   Status New   Target Date 02/23/17     OT LONG TERM GOAL #2   Title Pt to report pain less than or equal to 3/10 with work related tasks and leisure activities   Time 8   Period Weeks   Status New  OT LONG TERM GOAL #3   Title Grip strength Lt hand to increase to 90 lbs or greater   Baseline eval = 80 lbs (Rt = 103 lbs)   Time 8   Period Weeks   Status New               Plan - 01/07/17 1204    Clinical Impression Statement Pt tolerating stretches and wrist splint. Pt reports these are helping with pain. However, it fluctuates.    Rehab Potential Good   OT Frequency 2x / week  beginning upon return from vacation   OT Duration 6 weeks   OT Treatment/Interventions Self-care/ADL training;Moist Heat;Fluidtherapy;DME and/or AE instruction;Splinting;Patient/family education;Contrast Bath;Compression bandaging;Therapeutic exercises;Ultrasound;Therapeutic activities;Scar mobilization;Iontophoresis;Cryotherapy;Passive range of motion;Parrafin;Manual Therapy   Plan pt on vacation for 2 weeks, pt will return at that time and begin iontophoresis if MD agrees   Consulted and Agree with Plan of Care Patient      Patient will benefit from skilled therapeutic intervention in order to improve the following deficits and impairments:  Decreased strength, Pain, Impaired UE functional use, Decreased  activity tolerance, Decreased endurance, Decreased range of motion  Visit Diagnosis: Pain in left elbow  Muscle weakness (generalized)    Problem List Patient Active Problem List   Diagnosis Date Noted  . Lateral epicondylitis of left elbow 12/02/2016  . MCI (mild cognitive impairment) 05/01/2014  . Cough variant asthma 05/01/2014  . Routine general medical examination at a health care facility 08/26/2013  . IC (interstitial cystitis) 08/26/2013  . GAD (generalized anxiety disorder) 03/02/2013    Kelli ChurnBallie, Chord Takahashi Johnson, OTR/L 01/07/2017, 12:07 PM  [facility] [address] [place], [place], [address] Phone: [phone]   Fax:  [phone]  Name: Wayne Silva MRN: [id number] Date of Birth: June 16, 1960

## 2017-01-07 NOTE — Telephone Encounter (Signed)
Dr. Yetta BarreJones,   I would like to begin iontophoresis for Wayne Silva to address lateral epicondylitis and pain. If you agree, please sign below order and return via [facility] or fax to [phone]. Thanks!  By signing I understand that I am ordering/authorizing the use of Iontophoresis using 4 mg/mL of dexamethasone as a component of this plan of care.

## 2017-01-26 ENCOUNTER — Ambulatory Visit: Payer: BLUE CROSS/BLUE SHIELD | Attending: Internal Medicine | Admitting: Occupational Therapy

## 2017-01-26 DIAGNOSIS — M6281 Muscle weakness (generalized): Secondary | ICD-10-CM | POA: Diagnosis not present

## 2017-01-26 DIAGNOSIS — M25522 Pain in left elbow: Secondary | ICD-10-CM | POA: Insufficient documentation

## 2017-01-26 NOTE — Therapy (Signed)
[facility] [address] [place], [place], [address] Phone: [phone]   Fax:  [phone]  Occupational Therapy Treatment  Patient Details  Name: Wayne Silva MRN: [id number] Date of Birth: 1960/11/09 Referring Provider: Dr. Sanda Linger  Encounter Date: 01/26/2017      OT End of Session - 01/26/17 0834    Visit Number 4   Number of Visits 15   Date for OT Re-Evaluation 02/23/17   Authorization Type BC/BS   OT Start Time 0805   OT Stop Time 0848   OT Time Calculation (min) 43 min   Activity Tolerance Patient tolerated treatment well      Past Medical History:  Diagnosis Date  . Alcoholic (HCC)   . Anxiety   . Asthma   . Spastic bladder     Past Surgical History:  Procedure Laterality Date  . HEMORRHOID SURGERY    . KIDNEY STONE SURGERY      There were no vitals filed for this visit.      Subjective Assessment - 01/26/17 0807    Subjective  It's not bad today (re: pain)    Pertinent History asthma   Patient Stated Goals get this arm so there's no pain, and get back to leisure activities w/o exacerbating it   Currently in Pain? Yes   Pain Score 2    Pain Location Elbow   Pain Orientation Left   Pain Descriptors / Indicators Aching   Pain Type Acute pain   Pain Onset More than a month ago   Pain Frequency Intermittent   Aggravating Factors  use   Pain Relieving Factors ice, wrist brace                      OT Treatments/Exercises (OP) - 01/26/17 0001      Modalities   Modalities Iontophoresis     Iontophoresis   Type of Iontophoresis Dexamethasone  clearance from MD, no contraindications/precautions   Location lateral proximal elbow   Dose 2.0 cc, 2.2 mA (dose #1)    Time 18 minutes     Manual Therapy   Manual Therapy Soft tissue mobilization   Manual therapy comments along wrist extensor musculature                OT Education - 01/26/17 0833    Education  provided Yes   Education Details Iontophoresis precautions and contraindications   Person(s) Educated Patient   Methods Explanation;Handout   Comprehension Verbalized understanding          OT Short Term Goals - 12/25/16 1630      OT SHORT TERM GOAL #1   Title Pt to verbalize understanding with all pain reduction strategies including: modalities, splinting, task modifications, and A/E needs   Time 4   Period Weeks   Status On-going   Target Date 02/23/17     OT SHORT TERM GOAL #2   Title Pt to be independent with splint wear and care   Time 4   Period Weeks   Status On-going           OT Long Term Goals - 12/25/16 1633      OT LONG TERM GOAL #1   Title Independent with updated HEP    Time 8   Period Weeks   Status New   Target Date 02/23/17     OT LONG TERM GOAL #2   Title Pt to report pain less than or equal to  3/10 with work related tasks and leisure activities   Time 8   Period Weeks   Status New     OT LONG TERM GOAL #3   Title Grip strength Lt hand to increase to 90 lbs or greater   Baseline eval = 80 lbs (Rt = 103 lbs)   Time 8   Period Weeks   Status New               Plan - 01/26/17 1610    Clinical Impression Statement Pt returns today with clearance to begin iontophoresis. Pt with decreased pain today   Rehab Potential Good   OT Frequency 2x / week   OT Duration 6 weeks   OT Treatment/Interventions Self-care/ADL training;Moist Heat;Fluidtherapy;DME and/or AE instruction;Splinting;Patient/family education;Contrast Bath;Compression bandaging;Therapeutic exercises;Ultrasound;Therapeutic activities;Scar mobilization;Iontophoresis;Cryotherapy;Passive range of motion;Parrafin;Manual Therapy   Plan continue ionto dose #2   Consulted and Agree with Plan of Care Patient      Patient will benefit from skilled therapeutic intervention in order to improve the following deficits and impairments:  Decreased strength, Pain, Impaired UE functional  use, Decreased activity tolerance, Decreased endurance, Decreased range of motion  Visit Diagnosis: Pain in left elbow  Muscle weakness (generalized)    Problem List Patient Active Problem List   Diagnosis Date Noted  . Lateral epicondylitis of left elbow 12/02/2016  . MCI (mild cognitive impairment) 05/01/2014  . Cough variant asthma 05/01/2014  . Routine general medical examination at a health care facility 08/26/2013  . IC (interstitial cystitis) 08/26/2013  . GAD (generalized anxiety disorder) 03/02/2013    Kelli Churn, OTR/L 01/26/2017, 8:41 AM  [facility] [address] [place], [place], [address] Phone: [phone]   Fax:  [phone]  Name: Wayne Silva MRN: [id number] Date of Birth: 1961/04/11

## 2017-01-28 ENCOUNTER — Encounter: Payer: BLUE CROSS/BLUE SHIELD | Admitting: Occupational Therapy

## 2017-01-29 ENCOUNTER — Ambulatory Visit: Payer: BLUE CROSS/BLUE SHIELD | Admitting: Occupational Therapy

## 2017-01-29 DIAGNOSIS — M6281 Muscle weakness (generalized): Secondary | ICD-10-CM

## 2017-01-29 DIAGNOSIS — M25522 Pain in left elbow: Secondary | ICD-10-CM

## 2017-01-29 NOTE — Therapy (Signed)
[facility] [address] [place], [place], [address] Phone: [phone]   Fax:  [phone]  Occupational Therapy Treatment  Patient Details  Name: Wayne Silva MRN: [id number] Date of Birth: [birth year] Referring Provider: Dr. Sanda Linger  Encounter Date: 01/29/2017      OT End of Session - 01/29/17 1516    Visit Number 5   Number of Visits 15   Date for OT Re-Evaluation 02/23/17   Authorization Type BC/BS   OT Start Time 1450   OT Stop Time 1530   OT Time Calculation (min) 40 min   Activity Tolerance Patient tolerated treatment well      Past Medical History:  Diagnosis Date  . Alcoholic (HCC)   . Anxiety   . Asthma   . Spastic bladder     Past Surgical History:  Procedure Laterality Date  . HEMORRHOID SURGERY    . KIDNEY STONE SURGERY      There were no vitals filed for this visit.      Subjective Assessment - 01/29/17 1452    Subjective  After my bike ride last night, pain got up to 5/10   Pertinent History asthma   Patient Stated Goals get this arm so there's no pain, and get back to leisure activities w/o exacerbating it   Currently in Pain? Yes   Pain Score 3    Pain Location Elbow   Pain Orientation Left   Pain Descriptors / Indicators Sore   Pain Type Acute pain   Pain Onset More than a month ago   Pain Frequency Intermittent   Aggravating Factors  use   Pain Relieving Factors ice, wrist brace                      OT Treatments/Exercises (OP) - 01/29/17 0001      Iontophoresis   Type of Iontophoresis Dexamethasone   Location lateral proximal elbow   Dose 2.0 cc, 2.2 mA (dose #2)    Time 18 minutes     Manual Therapy   Manual Therapy Soft tissue mobilization   Manual therapy comments along wrist extensor musculature                  OT Short Term Goals - 01/29/17 1519      OT SHORT TERM GOAL #1   Title Pt to verbalize understanding with all pain  reduction strategies including: modalities, splinting, task modifications, and A/E needs   Time 4   Period Weeks   Status Achieved     OT SHORT TERM GOAL #2   Title Pt to be independent with splint wear and care   Time 4   Period Weeks   Status Achieved           OT Long Term Goals - 01/29/17 1519      OT LONG TERM GOAL #1   Title Independent with updated HEP    Time 8   Period Weeks   Status On-going     OT LONG TERM GOAL #2   Title Pt to report pain less than or equal to 3/10 with work related tasks and leisure activities   Time 8   Period Weeks   Status On-going     OT LONG TERM GOAL #3   Title Grip strength Lt hand to increase to 90 lbs or greater   Baseline eval = 80 lbs (Rt = 103 lbs)   Time 8   Period  Weeks   Status New               Plan - 01/29/17 1519    Clinical Impression Statement Pt with slightly increased pain after biking last night, but has decreased since then.    Rehab Potential Good   OT Frequency 2x / week   OT Duration 6 weeks   OT Treatment/Interventions Self-care/ADL training;Moist Heat;Fluidtherapy;DME and/or AE instruction;Splinting;Patient/family education;Contrast Bath;Compression bandaging;Therapeutic exercises;Ultrasound;Therapeutic activities;Scar mobilization;Iontophoresis;Cryotherapy;Passive range of motion;Parrafin;Manual Therapy   Plan continue ionto dose #3, pain management, ex's   Consulted and Agree with Plan of Care Patient      Patient will benefit from skilled therapeutic intervention in order to improve the following deficits and impairments:  Decreased strength, Pain, Impaired UE functional use, Decreased activity tolerance, Decreased endurance, Decreased range of motion  Visit Diagnosis: Pain in left elbow  Muscle weakness (generalized)    Problem List Patient Active Problem List   Diagnosis Date Noted  . Lateral epicondylitis of left elbow 12/02/2016  . MCI (mild cognitive impairment) 05/01/2014  .  Cough variant asthma 05/01/2014  . Routine general medical examination at a health care facility 08/26/2013  . IC (interstitial cystitis) 08/26/2013  . GAD (generalized anxiety disorder) 03/02/2013    Kelli Churn, OTR/L 01/29/2017, 3:21 PM  [facility] [address] [place], [place], [address] Phone: [phone]   Fax:  [phone]  Name: Wayne Silva MRN: [id number] Date of Birth: 1960-06-26

## 2017-01-30 DIAGNOSIS — N3010 Interstitial cystitis (chronic) without hematuria: Secondary | ICD-10-CM | POA: Diagnosis not present

## 2017-02-02 ENCOUNTER — Ambulatory Visit: Payer: BLUE CROSS/BLUE SHIELD | Admitting: Occupational Therapy

## 2017-02-02 DIAGNOSIS — M25522 Pain in left elbow: Secondary | ICD-10-CM | POA: Diagnosis not present

## 2017-02-02 DIAGNOSIS — M6281 Muscle weakness (generalized): Secondary | ICD-10-CM | POA: Diagnosis not present

## 2017-02-02 NOTE — Patient Instructions (Signed)
AROM: Wrist Extension    With right palm down, bend wrist up. Repeat __10__ times per set. Do __1__ sets per session. Do _2-3__ sessions per day.  AROM: Wrist Flexion    With right palm up, bend wrist up. Repeat _10___ times per set. Do __1__ sets per session. Do _2-3___ sessions per day.  Wrist Extension: Isometric    With left forearm resting palm down on thigh, resist upward movement of hand with other hand. Hold _5___ seconds. Relax. Repeat __5__ times per set. Do __1__ sets per session. Do 2-3____ sessions per day.  Wrist Extensor Stretch    Keeping elbow straight, grasp left hand and slowly bend wrist forward until stretch is felt. Hold __10__ seconds. Relax. Repeat __2-3__ times per set. Do __1__ sets per session. Do _2-3___ sessions per day.  Flexion (Isometric)    With forearm held steady, palm up, use other hand to resist upward movement of hand at wrist. Hold __5__ seconds. Relax. Repeat __5__ times. Do _2-3___ sessions per day.   Wrist Flexors    Elbow straight, palm up. Grasp fingers with other hand and slowly bend wrist backward. Hold _10__ seconds. Repeat _2-3__ times per session. Do _2-3__ sessions per day.

## 2017-02-02 NOTE — Therapy (Signed)
[facility] [address] [place], [place], [address] Phone: [phone]   Fax:  [phone]  Occupational Therapy Treatment  Patient Details  Name: Wayne Silva MRN: [id number] Date of Birth: Sep 24, 1960 Referring Provider: Dr. Sanda Linger  Encounter Date: 02/02/2017      OT End of Session - 02/02/17 0841    Visit Number 6   Number of Visits 15   Date for OT Re-Evaluation 02/23/17   Authorization Type BC/BS   OT Start Time 0805   OT Stop Time 0855   OT Time Calculation (min) 50 min   Activity Tolerance Patient tolerated treatment well      Past Medical History:  Diagnosis Date  . Alcoholic (HCC)   . Anxiety   . Asthma   . Spastic bladder     Past Surgical History:  Procedure Laterality Date  . HEMORRHOID SURGERY    . KIDNEY STONE SURGERY      There were no vitals filed for this visit.      Subjective Assessment - 02/02/17 0807    Pertinent History asthma   Patient Stated Goals get this arm so there's no pain, and get back to leisure activities w/o exacerbating it   Currently in Pain? Yes   Pain Score 3    Pain Location Elbow   Pain Orientation Left   Pain Descriptors / Indicators Sore   Pain Type Acute pain   Pain Onset More than a month ago   Pain Frequency Intermittent   Aggravating Factors  use   Pain Relieving Factors ice, wrist brace                      OT Treatments/Exercises (OP) - 02/02/17 0001      Wrist Exercises   Other wrist exercises wrist flexion and extension each x 10 reps against gravity   Other wrist exercises See pt instructions for details on updated HEP for A/ROM, isometric ex's and passive stretches to wrist flexors and extensors     Iontophoresis   Type of Iontophoresis Dexamethasone   Location lateral proximal elbow   Dose 2.0 cc, 2.2 mA (dose #3)    Time 18 minutes     Manual Therapy   Manual Therapy Soft tissue mobilization   Soft tissue  mobilization massage along muscle, cross friction massage followed by soft tissues mobs along wrist extensor musculature                OT Education - 02/02/17 0821    Education provided Yes   Education Details wrist A/ROM, Isometric and stretching HEP    Person(s) Educated Patient   Methods Explanation;Demonstration;Handout   Comprehension Verbalized understanding;Returned demonstration          OT Short Term Goals - 01/29/17 1519      OT SHORT TERM GOAL #1   Title Pt to verbalize understanding with all pain reduction strategies including: modalities, splinting, task modifications, and A/E needs   Time 4   Period Weeks   Status Achieved     OT SHORT TERM GOAL #2   Title Pt to be independent with splint wear and care   Time 4   Period Weeks   Status Achieved           OT Long Term Goals - 01/29/17 1519      OT LONG TERM GOAL #1   Title Independent with updated HEP    Time 8   Period Weeks  Status On-going     OT LONG TERM GOAL #2   Title Pt to report pain less than or equal to 3/10 with work related tasks and leisure activities   Time 8   Period Weeks   Status On-going     OT LONG TERM GOAL #3   Title Grip strength Lt hand to increase to 90 lbs or greater   Baseline eval = 80 lbs (Rt = 103 lbs)   Time 8   Period Weeks   Status New               Plan - 02/02/17 1610    Clinical Impression Statement Pt tolerating updated HEP with no increase in pain.    Rehab Potential Good   OT Frequency 2x / week   OT Duration 6 weeks   OT Treatment/Interventions Self-care/ADL training;Moist Heat;Fluidtherapy;DME and/or AE instruction;Splinting;Patient/family education;Contrast Bath;Compression bandaging;Therapeutic exercises;Ultrasound;Therapeutic activities;Scar mobilization;Iontophoresis;Cryotherapy;Passive range of motion;Parrafin;Manual Therapy   Plan continue ex's, ionto dose #4   Consulted and Agree with Plan of Care Patient      Patient will  benefit from skilled therapeutic intervention in order to improve the following deficits and impairments:  Decreased strength, Pain, Impaired UE functional use, Decreased activity tolerance, Decreased endurance, Decreased range of motion  Visit Diagnosis: Muscle weakness (generalized)  Pain in left elbow    Problem List Patient Active Problem List   Diagnosis Date Noted  . Lateral epicondylitis of left elbow 12/02/2016  . MCI (mild cognitive impairment) 05/01/2014  . Cough variant asthma 05/01/2014  . Routine general medical examination at a health care facility 08/26/2013  . IC (interstitial cystitis) 08/26/2013  . GAD (generalized anxiety disorder) 03/02/2013    Kelli Churn, OTR/L 02/02/2017, 8:44 AM  [facility] [address] [place], [place], [address] Phone: [phone]   Fax:  [phone]  Name: Wayne Silva MRN: [id number] Date of Birth: Mar 13, 1961

## 2017-02-04 ENCOUNTER — Encounter: Payer: BLUE CROSS/BLUE SHIELD | Admitting: Occupational Therapy

## 2017-02-05 ENCOUNTER — Ambulatory Visit: Payer: BLUE CROSS/BLUE SHIELD | Admitting: Occupational Therapy

## 2017-02-05 DIAGNOSIS — M6281 Muscle weakness (generalized): Secondary | ICD-10-CM

## 2017-02-05 DIAGNOSIS — M25522 Pain in left elbow: Secondary | ICD-10-CM

## 2017-02-05 NOTE — Therapy (Signed)
[facility] [address] [place], [place], [address] Phone: [phone]   Fax:  [phone]  Occupational Therapy Treatment  Patient Details  Name: Wayne Silva MRN: [id number] Date of Birth: Dec 16, 1960 Referring Provider: Dr. Sanda Linger  Encounter Date: 02/05/2017      OT End of Session - 02/05/17 1514    Visit Number 7   Number of Visits 15   Date for OT Re-Evaluation 02/23/17   Authorization Type BC/BS   OT Start Time 1445   OT Stop Time 1530   OT Time Calculation (min) 45 min   Activity Tolerance Patient tolerated treatment well      Past Medical History:  Diagnosis Date  . Alcoholic (HCC)   . Anxiety   . Asthma   . Spastic bladder     Past Surgical History:  Procedure Laterality Date  . HEMORRHOID SURGERY    . KIDNEY STONE SURGERY      There were no vitals filed for this visit.      Subjective Assessment - 02/05/17 1451    Subjective  I don't have any pain right now. The highest it goes up to is about 4/10    Pertinent History asthma   Patient Stated Goals get this arm so there's no pain, and get back to leisure activities w/o exacerbating it   Currently in Pain? Yes   Pain Score 3    Pain Location Elbow   Pain Orientation Left   Pain Descriptors / Indicators Sore   Pain Type Acute pain   Pain Onset More than a month ago   Pain Frequency Intermittent   Aggravating Factors  certain movements   Pain Relieving Factors iontophoresis, rest                      OT Treatments/Exercises (OP) - 02/05/17 0001      Wrist Exercises   Other wrist exercises Reviewed updated HEP from previous session - pt performed each as directed with no increase in pain     Iontophoresis   Type of Iontophoresis Dexamethasone   Location lateral proximal elbow   Dose 2.0 cc, 2.2 mA (dose #4)    Time 18 minutes     Manual Therapy   Manual Therapy Soft tissue mobilization   Manual therapy  comments along wrist extensor musculature   Soft tissue mobilization massage along muscle, cross friction massage followed by soft tissues mobs along wrist extensor musculature                  OT Short Term Goals - 01/29/17 1519      OT SHORT TERM GOAL #1   Title Pt to verbalize understanding with all pain reduction strategies including: modalities, splinting, task modifications, and A/E needs   Time 4   Period Weeks   Status Achieved     OT SHORT TERM GOAL #2   Title Pt to be independent with splint wear and care   Time 4   Period Weeks   Status Achieved           OT Long Term Goals - 01/29/17 1519      OT LONG TERM GOAL #1   Title Independent with updated HEP    Time 8   Period Weeks   Status On-going     OT LONG TERM GOAL #2   Title Pt to report pain less than or equal to 3/10 with work related tasks and leisure  activities   Time 8   Period Weeks   Status On-going     OT LONG TERM GOAL #3   Title Grip strength Lt hand to increase to 90 lbs or greater   Baseline eval = 80 lbs (Rt = 103 lbs)   Time 8   Period Weeks   Status New               Plan - 02/05/17 1515    Clinical Impression Statement Pt gradually improving with no increase in pain   Rehab Potential Good   OT Frequency 2x / week   OT Duration 6 weeks   OT Treatment/Interventions Self-care/ADL training;Moist Heat;Fluidtherapy;DME and/or AE instruction;Splinting;Patient/family education;Contrast Bath;Compression bandaging;Therapeutic exercises;Ultrasound;Therapeutic activities;Scar mobilization;Iontophoresis;Cryotherapy;Passive range of motion;Parrafin;Manual Therapy   Plan begin light wrist and hand strengthening, continue ionto dose #5   Consulted and Agree with Plan of Care Patient      Patient will benefit from skilled therapeutic intervention in order to improve the following deficits and impairments:  Decreased strength, Pain, Impaired UE functional use, Decreased activity  tolerance, Decreased endurance, Decreased range of motion  Visit Diagnosis: Muscle weakness (generalized)  Pain in left elbow    Problem List Patient Active Problem List   Diagnosis Date Noted  . Lateral epicondylitis of left elbow 12/02/2016  . MCI (mild cognitive impairment) 05/01/2014  . Cough variant asthma 05/01/2014  . Routine general medical examination at a health care facility 08/26/2013  . IC (interstitial cystitis) 08/26/2013  . GAD (generalized anxiety disorder) 03/02/2013    Kelli Churn, OTR/L 02/05/2017, 3:17 PM  [facility] [address] [place], [place], [address] Phone: [phone]   Fax:  [phone]  Name: Zahari Xiang MRN: [id number] Date of Birth: 1961/02/07

## 2017-02-09 ENCOUNTER — Ambulatory Visit: Payer: BLUE CROSS/BLUE SHIELD | Attending: Internal Medicine | Admitting: Occupational Therapy

## 2017-02-09 DIAGNOSIS — M6281 Muscle weakness (generalized): Secondary | ICD-10-CM

## 2017-02-09 DIAGNOSIS — M25522 Pain in left elbow: Secondary | ICD-10-CM | POA: Diagnosis not present

## 2017-02-09 NOTE — Patient Instructions (Signed)
1. Extension (Resistive)    With wrist over edge of table, lift __16__ ounces, keeping arm on table surface. Hold __3__ seconds. Lower slowly. Repeat __10__ times. Do __2-3__ sessions per day. After 1-2 weeks, increase to 2 lb. Weight   2. Flexion (Resistive)    With hand palm-up and holding _16___ ounces, bend hand toward you at wrist. Hold _3___ seconds. Relax slowly. Repeat __10__ times. Do _2-3___ sessions per day. After 1-2 weeks, increase to 2 lb. weight    3. Grip Strengthening (Resistive Putty)   Squeeze putty using thumb and all fingers. Repeat _15-20___ times. Do __2__ sessions per day.   4. Roll putty into tube on table and pinch between each finger and thumb x 10 reps each. (Do ring and small finger together)

## 2017-02-09 NOTE — Therapy (Signed)
[facility] [address] [place], [place], [address] Phone: [phone]   Fax:  [phone]  Occupational Therapy Treatment  Patient Details  Name: Wayne Silva MRN: [id number] Date of Birth: August 06, 1960 Referring Provider: Dr. Scarlette Calico  Encounter Date: 02/09/2017      OT End of Session - 02/09/17 0838    Visit Number 8   Number of Visits 15   Date for OT Re-Evaluation 02/23/17   Authorization Type BC/BS   OT Start Time 0803   OT Stop Time 0848   OT Time Calculation (min) 45 min   Activity Tolerance Patient tolerated treatment well      Past Medical History:  Diagnosis Date  . Alcoholic ([place])   . Anxiety   . Asthma   . Spastic bladder     Past Surgical History:  Procedure Laterality Date  . HEMORRHOID SURGERY    . KIDNEY STONE SURGERY      There were no vitals filed for this visit.      Subjective Assessment - 02/09/17 0803    Subjective  Sometimes I feel great, but I can feel it if I move it a certain way   Pertinent History asthma   Patient Stated Goals get this arm so there's no pain, and get back to leisure activities w/o exacerbating it   Currently in Pain? Yes   Pain Score 3    Pain Location Elbow   Pain Orientation Left   Pain Descriptors / Indicators Sore   Pain Type Acute pain   Pain Onset More than a month ago   Pain Frequency Intermittent   Aggravating Factors  certain movements   Pain Relieving Factors iontophoresis, rest                      OT Treatments/Exercises (OP) - 02/09/17 0001      Exercises   Exercises Hand     Wrist Exercises   Other wrist exercises A/ROM in wrist flexion/extension followed by passive stretches   Other wrist exercises Began light strengthening in wrist flex/ext with 1 lb. weight x 10 reps each. (see pt instructions)      Hand Exercises   Other Hand Exercises Issued putty HEP and issued green putty - see pt instructions for  details     Iontophoresis   Type of Iontophoresis Dexamethasone   Location lateral proximal elbow   Dose 2.0 cc, 2.2 mA (dose #5)    Time 18 minutes     Manual Therapy   Manual Therapy Soft tissue mobilization   Manual therapy comments along wrist extensor musculature                OT Education - 02/09/17 0816    Education provided Yes   Education Details Strengthening HEP    Person(s) Educated Patient   Methods Explanation;Demonstration;Handout   Comprehension Verbalized understanding;Returned demonstration          OT Short Term Goals - 01/29/17 1519      OT SHORT TERM GOAL #1   Title Pt to verbalize understanding with all pain reduction strategies including: modalities, splinting, task modifications, and A/E needs   Time 4   Period Weeks   Status Achieved     OT SHORT TERM GOAL #2   Title Pt to be independent with splint wear and care   Time 4   Period Weeks   Status Achieved  OT Long Term Goals - 02/09/17 0839      OT LONG TERM GOAL #1   Title Independent with updated HEP    Time 8   Period Weeks   Status Achieved     OT LONG TERM GOAL #2   Title Pt to report pain less than or equal to 3/10 with work related tasks and leisure activities   Time 8   Period Weeks   Status On-going     OT LONG TERM GOAL #3   Title Grip strength Lt hand to increase to 90 lbs or greater   Baseline eval = 80 lbs (Rt = 103 lbs)   Time 8   Period Weeks   Status On-going               Plan - 02/09/17 2417    Clinical Impression Statement Pt met LTG #1 and approximating remaining goals.    Rehab Potential Good   OT Frequency 2x / week   OT Duration 6 weeks   OT Treatment/Interventions Self-care/ADL training;Moist Heat;Fluidtherapy;DME and/or AE instruction;Splinting;Patient/family education;Contrast Bath;Compression bandaging;Therapeutic exercises;Ultrasound;Therapeutic activities;Scar mobilization;Iontophoresis;Cryotherapy;Passive range of  motion;Parrafin;Manual Therapy   Plan review strengthening HEP, continue last ionto dose #6, review remaining goals and d/c (d/t patient going out of town next week)    Consulted and Agree with Plan of Care Patient      Patient will benefit from skilled therapeutic intervention in order to improve the following deficits and impairments:  Decreased strength, Pain, Impaired UE functional use, Decreased activity tolerance, Decreased endurance, Decreased range of motion  Visit Diagnosis: Muscle weakness (generalized)  Pain in left elbow    Problem List Patient Active Problem List   Diagnosis Date Noted  . Lateral epicondylitis of left elbow 12/02/2016  . MCI (mild cognitive impairment) 05/01/2014  . Cough variant asthma 05/01/2014  . Routine general medical examination at a health care facility 08/26/2013  . IC (interstitial cystitis) 08/26/2013  . GAD (generalized anxiety disorder) 03/02/2013    Carey Bullocks, OTR/L 02/09/2017, 8:41 AM  [place] [address] [place], [place], [address] Phone: [phone]   Fax:  [phone]  Name: Wayne Silva MRN: [id number] Date of Birth: 1961/02/18

## 2017-02-11 ENCOUNTER — Ambulatory Visit: Payer: BLUE CROSS/BLUE SHIELD | Admitting: Occupational Therapy

## 2017-02-11 DIAGNOSIS — M25522 Pain in left elbow: Secondary | ICD-10-CM

## 2017-02-11 DIAGNOSIS — M6281 Muscle weakness (generalized): Secondary | ICD-10-CM | POA: Diagnosis not present

## 2017-02-11 NOTE — Therapy (Signed)
[facility] [address] [place], [place], [address] Phone: [phone]   Fax:  [phone]  Occupational Therapy Treatment  Patient Details  Name: Wayne Silva MRN: [id number] Date of Birth: 1961-01-18 Referring Provider: Dr. Sanda Linger  Encounter Date: 02/11/2017      OT End of Session - 02/11/17 0839    Visit Number 9   Number of Visits 15   Date for OT Re-Evaluation 02/23/17   Authorization Type BC/BS   OT Start Time 0800   OT Stop Time 0855   OT Time Calculation (min) 55 min   Activity Tolerance Patient tolerated treatment well      Past Medical History:  Diagnosis Date  . Alcoholic (HCC)   . Anxiety   . Asthma   . Spastic bladder     Past Surgical History:  Procedure Laterality Date  . HEMORRHOID SURGERY    . KIDNEY STONE SURGERY      There were no vitals filed for this visit.      Subjective Assessment - 02/11/17 0806    Pertinent History asthma   Patient Stated Goals get this arm so there's no pain, and get back to leisure activities w/o exacerbating it   Currently in Pain? Yes   Pain Score 4    Pain Location Elbow   Pain Orientation Left   Pain Descriptors / Indicators Sore   Pain Type Acute pain   Pain Onset More than a month ago   Pain Frequency Intermittent   Aggravating Factors  certain movements   Pain Relieving Factors iontophoresis, rest                      OT Treatments/Exercises (OP) - 02/11/17 0001      Wrist Exercises   Other wrist exercises Reviewed wrist strengthening HEP - pt demo each x 10 reps each     Hand Exercises   Other Hand Exercises Reviewed putty HEP verbally   Other Hand Exercises Gripper set at level 2 resistance to pick up blocks for sustained grip strength Lt hand. Pt instructed to stop twice and rest/stretch wrist to manage pain. Pt with min drops and slight incr. in pain to 5/10 (from 4/10)     Iontophoresis   Type of Iontophoresis  Dexamethasone   Location lateral proximal elbow   Dose 2.0 cc, 2.2 mA (dose #6)    Time 18 minutes     Manual Therapy   Soft tissue mobilization massage along muscle, cross friction massage followed by soft tissues mobs along wrist extensor musculature                  OT Short Term Goals - 01/29/17 1519      OT SHORT TERM GOAL #1   Title Pt to verbalize understanding with all pain reduction strategies including: modalities, splinting, task modifications, and A/E needs   Time 4   Period Weeks   Status Achieved     OT SHORT TERM GOAL #2   Title Pt to be independent with splint wear and care   Time 4   Period Weeks   Status Achieved           OT Long Term Goals - 02/09/17 6962      OT LONG TERM GOAL #1   Title Independent with updated HEP    Time 8   Period Weeks   Status Achieved     OT LONG TERM GOAL #2  Title Pt to report pain less than or equal to 3/10 with work related tasks and leisure activities   Time 8   Period Weeks   Status On-going     OT LONG TERM GOAL #3   Title Grip strength Lt hand to increase to 90 lbs or greater   Baseline eval = 80 lbs (Rt = 103 lbs)   Time 8   Period Weeks   Status On-going               Plan - 02/11/17 0839    Clinical Impression Statement Pt continues to make steady progress but still has pain with certain movements. Pt has lost grip strength Lt hand (down to 55 lbs)    Rehab Potential Good   OT Frequency 2x / week   OT Duration 6 weeks   OT Treatment/Interventions Self-care/ADL training;Moist Heat;Fluidtherapy;DME and/or AE instruction;Splinting;Patient/family education;Contrast Bath;Compression bandaging;Therapeutic exercises;Ultrasound;Therapeutic activities;Scar mobilization;Iontophoresis;Cryotherapy;Passive range of motion;Parrafin;Manual Therapy   Plan pt to return on 02/23/17 for last appt - will upgrade wrist strengthening to 2 lb. weight if tolerated, continue grip strengthening, assess remaining  LTG's and d/c    Consulted and Agree with Plan of Care Patient      Patient will benefit from skilled therapeutic intervention in order to improve the following deficits and impairments:  Decreased strength, Pain, Impaired UE functional use, Decreased activity tolerance, Decreased endurance, Decreased range of motion  Visit Diagnosis: Muscle weakness (generalized)  Pain in left elbow    Problem List Patient Active Problem List   Diagnosis Date Noted  . Lateral epicondylitis of left elbow 12/02/2016  . MCI (mild cognitive impairment) 05/01/2014  . Cough variant asthma 05/01/2014  . Routine general medical examination at a health care facility 08/26/2013  . IC (interstitial cystitis) 08/26/2013  . GAD (generalized anxiety disorder) 03/02/2013    Kelli Churn, OTR/L 02/11/2017, 8:44 AM  [facility] [address] [place], [place], [address] Phone: [phone]   Fax:  [phone]  Name: Jaymien Landin MRN: [id number] Date of Birth: [birth year]

## 2017-02-13 ENCOUNTER — Other Ambulatory Visit: Payer: Self-pay | Admitting: Internal Medicine

## 2017-02-13 DIAGNOSIS — F411 Generalized anxiety disorder: Secondary | ICD-10-CM

## 2017-02-16 ENCOUNTER — Encounter: Payer: BLUE CROSS/BLUE SHIELD | Admitting: Occupational Therapy

## 2017-02-23 ENCOUNTER — Ambulatory Visit: Payer: BLUE CROSS/BLUE SHIELD | Admitting: Occupational Therapy

## 2017-02-23 DIAGNOSIS — M25522 Pain in left elbow: Secondary | ICD-10-CM | POA: Diagnosis not present

## 2017-02-23 DIAGNOSIS — M6281 Muscle weakness (generalized): Secondary | ICD-10-CM | POA: Diagnosis not present

## 2017-02-23 NOTE — Therapy (Signed)
[place] [address] [place], [place], [address] Phone: [phone]   Fax:  [phone]  Occupational Therapy Treatment  Patient Details  Name: Wayne Silva MRN: [id number] Date of Birth: 04-07-1961 Referring Provider: Dr. Scarlette Calico  Encounter Date: 02/23/2017      OT End of Session - 02/23/17 0835    Visit Number 10   Number of Visits 15   Date for OT Re-Evaluation 02/23/17   Authorization Type BC/BS   OT Start Time 0800  12 min. of ice   OT Stop Time 0845   OT Time Calculation (min) 45 min   Activity Tolerance Patient tolerated treatment well      Past Medical History:  Diagnosis Date  . Alcoholic ([place])   . Anxiety   . Asthma   . Spastic bladder     Past Surgical History:  Procedure Laterality Date  . HEMORRHOID SURGERY    . KIDNEY STONE SURGERY      There were no vitals filed for this visit.      Subjective Assessment - 02/23/17 0806    Subjective  Pain can go up to 5/10 at times with strengthening, but that's the highest it goes. I'm not wearing my splint anymore either   Pertinent History asthma   Patient Stated Goals get this arm so there's no pain, and get back to leisure activities w/o exacerbating it   Currently in Pain? Yes   Pain Score 2    Pain Location Elbow   Pain Orientation Left   Pain Descriptors / Indicators Sore   Pain Type Acute pain   Pain Onset More than a month ago   Pain Frequency Intermittent   Aggravating Factors  certain movements   Pain Relieving Factors rest, modifications                      OT Treatments/Exercises (OP) - 02/23/17 0001      Wrist Exercises   Other wrist exercises Wrist extension x 5 reps, 2 sets with 2 lb. weight. Pain up to 5/10. Followed by wrist flexion x 5 reps, 2 sets with 2 lb. weight. Pt instructed to begin in gravity elim plane d/t pain, then try against gravity     Hand Exercises   Other Hand Exercises Reviewed  putty HEP - Pt demo each x 10 reps     Modalities   Modalities Cryotherapy     Cryotherapy   Number Minutes Cryotherapy 12 Minutes   Cryotherapy Location Forearm  at lateral proximal elbow   Type of Cryotherapy Ice pack     Manual Therapy   Manual therapy comments along wrist extensor musculature   Soft tissue mobilization massage along muscle, cross friction massage followed by soft tissues mobs along wrist extensor musculature                  OT Short Term Goals - 01/29/17 1519      OT SHORT TERM GOAL #1   Title Pt to verbalize understanding with all pain reduction strategies including: modalities, splinting, task modifications, and A/E needs   Time 4   Period Weeks   Status Achieved     OT SHORT TERM GOAL #2   Title Pt to be independent with splint wear and care   Time 4   Period Weeks   Status Achieved           OT Long Term Goals - 02/23/17 1572  OT LONG TERM GOAL #1   Title Independent with updated HEP    Time 8   Period Weeks   Status Achieved     OT LONG TERM GOAL #2   Title Pt to report pain less than or equal to 3/10 with work related tasks and leisure activities   Time 8   Period Weeks   Status Not Met  occasionally up to 5/10     OT LONG TERM GOAL #3   Title Grip strength Lt hand to increase to 90 lbs or greater   Baseline eval = 80 lbs (Rt = 103 lbs)   Time 8   Period Weeks   Status Not Met  Lt = 55 lbs               Plan - 02/23/17 0836    Clinical Impression Statement Pt overall with decreased pain with functional tasks. Pt has lost grip strength, but expect to gradually improve over time with putty HEP and functional tasks.    OT Treatment/Interventions Self-care/ADL training;Moist Heat;Fluidtherapy;DME and/or AE instruction;Splinting;Patient/family education;Contrast Bath;Compression bandaging;Therapeutic exercises;Ultrasound;Therapeutic activities;Scar mobilization;Iontophoresis;Cryotherapy;Passive range of  motion;Parrafin;Manual Therapy   Plan D/C O.T.    Consulted and Agree with Plan of Care Patient      Patient will benefit from skilled therapeutic intervention in order to improve the following deficits and impairments:  Decreased strength, Pain, Impaired UE functional use, Decreased activity tolerance, Decreased endurance, Decreased range of motion  Visit Diagnosis: Muscle weakness (generalized)  Pain in left elbow    Problem List Patient Active Problem List   Diagnosis Date Noted  . Lateral epicondylitis of left elbow 12/02/2016  . MCI (mild cognitive impairment) 05/01/2014  . Cough variant asthma 05/01/2014  . Routine general medical examination at a health care facility 08/26/2013  . IC (interstitial cystitis) 08/26/2013  . GAD (generalized anxiety disorder) 03/02/2013     OCCUPATIONAL THERAPY DISCHARGE SUMMARY  Visits from Start of Care: 10   Current functional level related to goals / functional outcomes: See above goal section   Remaining deficits: Pain Lt elbow (0/10 - 5/10) Grip strength and forearm strength LUE   Education / Equipment: Pain management strategies including: splint wear and care, massage, stretches, use of modalities; HEP's  Plan: Patient agrees to discharge.  Patient goals were partially met. Patient is being discharged due to being pleased with the current functional level.  Pt is also going out of town again later this week and needed to wrap up one visit sooner than originally planned. Pt was instructed to return to MD if pt has exacerbation in pain - pt agrees?????          Carey Bullocks, OTR/L 02/23/2017, 8:42 AM  [place] [address] [place], [place], [address] Phone: [phone]   Fax:  [phone]  Name: Wayne Silva MRN: [id number] Date of Birth: Mar 02, 1961

## 2017-03-19 DIAGNOSIS — L820 Inflamed seborrheic keratosis: Secondary | ICD-10-CM | POA: Diagnosis not present

## 2017-03-19 DIAGNOSIS — D224 Melanocytic nevi of scalp and neck: Secondary | ICD-10-CM | POA: Diagnosis not present

## 2017-03-19 DIAGNOSIS — D225 Melanocytic nevi of trunk: Secondary | ICD-10-CM | POA: Diagnosis not present

## 2017-03-19 DIAGNOSIS — D485 Neoplasm of uncertain behavior of skin: Secondary | ICD-10-CM | POA: Diagnosis not present

## 2017-04-20 ENCOUNTER — Ambulatory Visit: Payer: BLUE CROSS/BLUE SHIELD | Admitting: Neurology

## 2017-04-24 DIAGNOSIS — N3010 Interstitial cystitis (chronic) without hematuria: Secondary | ICD-10-CM | POA: Diagnosis not present

## 2017-07-24 DIAGNOSIS — N401 Enlarged prostate with lower urinary tract symptoms: Secondary | ICD-10-CM | POA: Diagnosis not present

## 2017-07-24 DIAGNOSIS — R358 Other polyuria: Secondary | ICD-10-CM | POA: Diagnosis not present

## 2017-07-24 DIAGNOSIS — N3010 Interstitial cystitis (chronic) without hematuria: Secondary | ICD-10-CM | POA: Diagnosis not present

## 2017-07-24 DIAGNOSIS — N138 Other obstructive and reflux uropathy: Secondary | ICD-10-CM | POA: Diagnosis not present

## 2017-07-25 LAB — PSA: PSA: 1.2

## 2017-09-09 ENCOUNTER — Other Ambulatory Visit: Payer: Self-pay | Admitting: Internal Medicine

## 2017-09-09 DIAGNOSIS — J45991 Cough variant asthma: Secondary | ICD-10-CM

## 2017-09-09 NOTE — Telephone Encounter (Signed)
LOV 12/02/16 Dr. Yetta Barre Ventolin refilled 01/09/15 Asmanex  Refilled 07/11/15

## 2017-09-09 NOTE — Telephone Encounter (Signed)
Copied from CRM [id number]. Topic: Inquiry >> Sep 09, 2017 12:28 PM Yvonna Alanis wrote: Reason for CRM: Patient called requesting refills of the following: Mometasone Furoate (ASMANEX HFA) 100 MCG/ACT AERO and Albuterol (VENTOLIN HFA) 108 (90 BASE) MCG/ACT inhaler. Patient did contact his pharmacy, which had no refills. Patient's pharmacy asked patient to call us. Patient's preferred pharmacy is [id number] [place], [place] - [address] [phone] (Phone)  [phone] (Fax).       Thank You!!!

## 2017-09-14 NOTE — Telephone Encounter (Signed)
Pt is due for an annual. Can you contact pt pls.

## 2017-09-15 MED ORDER — MOMETASONE FUROATE 100 MCG/ACT IN AERO: 1.0000 | INHALATION_SPRAY | Freq: Two times a day (BID) | RESPIRATORY_TRACT | 0 refills | Status: DC

## 2017-09-15 MED ORDER — ALBUTEROL SULFATE HFA 108 (90 BASE) MCG/ACT IN AERS: 1.0000 | INHALATION_SPRAY | Freq: Four times a day (QID) | RESPIRATORY_TRACT | 0 refills | Status: DC | PRN

## 2017-09-15 NOTE — Telephone Encounter (Signed)
Enough rx sent for 30 day supply.

## 2017-09-15 NOTE — Telephone Encounter (Signed)
Patient called and schedule an CPE for June and would like enough meds to last him until his next appt.

## 2017-10-14 ENCOUNTER — Encounter: Payer: BLUE CROSS/BLUE SHIELD | Admitting: Internal Medicine

## 2017-10-19 ENCOUNTER — Encounter: Payer: Self-pay | Admitting: Family

## 2017-10-19 ENCOUNTER — Ambulatory Visit (INDEPENDENT_AMBULATORY_CARE_PROVIDER_SITE_OTHER): Payer: BLUE CROSS/BLUE SHIELD | Admitting: Family

## 2017-10-19 VITALS — BP 106/68 | HR 76 | Temp 98.4°F | Ht 67.0 in | Wt 134.0 lb

## 2017-10-19 DIAGNOSIS — J45909 Unspecified asthma, uncomplicated: Secondary | ICD-10-CM

## 2017-10-19 MED ORDER — BENZONATATE 100 MG PO CAPS: 100.0000 mg | ORAL_CAPSULE | Freq: Three times a day (TID) | ORAL | 0 refills | Status: DC | PRN

## 2017-10-19 MED ORDER — PREDNISONE 20 MG PO TABS: ORAL_TABLET | ORAL | 0 refills | Status: DC

## 2017-10-19 MED ORDER — AZITHROMYCIN 250 MG PO TABS: ORAL_TABLET | ORAL | 0 refills | Status: DC

## 2017-10-19 NOTE — Progress Notes (Signed)
Drucilla SchmidtJohn Dascoli is a 57 y.o. male with the following history as recorded in EpicCare:  Patient Active Problem List   Diagnosis Date Noted  . Lateral epicondylitis of left elbow 12/02/2016  . MCI (mild cognitive impairment) 05/01/2014  . Cough variant asthma 05/01/2014  . Routine general medical examination at a health care facility 08/26/2013  . IC (interstitial cystitis) 08/26/2013  . GAD (generalized anxiety disorder) 03/02/2013    Current Outpatient Medications  Medication Sig Dispense Refill  . albuterol (VENTOLIN HFA) 108 (90 Base) MCG/ACT inhaler Inhale 1-2 puffs into the lungs every 6 (six) hours as needed for wheezing or shortness of breath. 18 g 0  . CIALIS 20 MG tablet TAKE 1 TABLET BY ORAL ROUTE EVERY DAY  10  . ibuprofen (ADVIL,MOTRIN) 600 MG tablet Take 1 tablet (600 mg total) by mouth every 8 (eight) hours as needed. 90 tablet 2  . Mometasone Furoate (ASMANEX HFA) 100 MCG/ACT AERO Inhale 1 Act into the lungs 2 times daily at 12 noon and 4 pm. 13 g 0  . montelukast (SINGULAIR) 10 MG tablet TAKE 1 TABLET BY MOUTH AT BEDTIME 90 tablet 3  . Omega-3 Fatty Acids (FISH OIL PO) Take by mouth daily.    . sertraline (ZOLOFT) 50 MG tablet Take 1 tablet (50 mg total) by mouth daily. 90 tablet 3  . valACYclovir (VALTREX) 1000 MG tablet Take 1 tablet by mouth daily.    Marland Kitchen. azithromycin (ZITHROMAX) 250 MG tablet 2 tabs po qd x 1 day; 1 tablet per day x 4 days; 6 tablet 0  . benzonatate (TESSALON) 100 MG capsule Take 1 capsule (100 mg total) by mouth 3 (three) times daily as needed. 20 capsule 0  . predniSONE (DELTASONE) 20 MG tablet Take 2 tablets x 2 days; then decrease to one per day 7 tablet 0   No current facility-administered medications for this visit.     Allergies: Patient has no known allergies.  Past Medical History:  Diagnosis Date  . Alcoholic (HCC)   . Anxiety   . Asthma   . Spastic bladder     Past Surgical History:  Procedure Laterality Date  . HEMORRHOID SURGERY    .  KIDNEY STONE SURGERY      Family History  Problem Relation Age of Onset  . Skin cancer Mother   . Pancreatic cancer Mother   . Colon cancer Father   . Alzheimer's disease Father   . Prostate cancer Paternal Grandfather   . Alzheimer's disease Maternal Grandmother     Social History   Tobacco Use  . Smoking status: Former Smoker    Last attempt to quit: 08/22/2004    Years since quitting: 13.1  . Smokeless tobacco: Never Used  . Tobacco comment: social smoker  Substance Use Topics  . 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    Objective:  Vitals:   10/19/17 1104  BP: 106/68  Pulse: 76  Temp: 98.4 F (36.9 C)  TempSrc: Oral  SpO2: 97%  Weight: 134 lb (60.8 kg)  Height: 5\' 7"  (1.702 m)    General: Well developed, well nourished, in no acute distress  Skin : Warm and dry.  Head: Normocephalic  and atraumatic  Eyes: Sclera and conjunctiva clear; pupils round and reactive to light; extraocular movements intact  Ears: External normal; canals clear; tympanic membranes normal  Oropharynx: Pink, supple. No suspicious lesions  Neck: Supple without thyromegaly, adenopathy  Lungs: Respirations unlabored; clear to auscultation bilaterally without wheeze, rales, rhonchi  CVS exam: normal rate and regular rhythm.  Abdomen: Soft; nontender; nondistended; normoactive bowel sounds; no masses or hepatosplenomegaly  Musculoskeletal: No deformities; no active joint inflammation  Extremities: No edema, cyanosis, clubbing  Vessels: Symmetric bilaterally  Neurologic: Alert and oriented; speech intact; face symmetrical; moves all extremities well; CNII-XII intact without focal deficit  Assessment:  1. Acute asthmatic bronchitis      Plan:  Rx for Z-pak #1 take as directed, Prednisone taper x 5 days and Tessalon perles; increase fluids, rest and follow-up worse, no better.   No follow-ups on file.  No orders of the defined types were placed in this encounter.   Requested Prescriptions   Signed Prescriptions Disp Refills  . azithromycin (ZITHROMAX) 250 MG tablet 6 tablet 0    Sig: 2 tabs po qd x 1 day; 1 tablet per day x 4 days;  . benzonatate (TESSALON) 100 MG capsule 20 capsule 0    Sig: Take 1 capsule (100 mg total) by mouth 3 (three) times daily as needed.  . predniSONE (DELTASONE) 20 MG tablet 7 tablet 0    Sig: Take 2 tablets x 2 days; then decrease to one per day

## 2017-10-29 ENCOUNTER — Encounter: Payer: Self-pay | Admitting: Internal Medicine

## 2017-10-29 ENCOUNTER — Ambulatory Visit (INDEPENDENT_AMBULATORY_CARE_PROVIDER_SITE_OTHER): Payer: BLUE CROSS/BLUE SHIELD | Admitting: Internal Medicine

## 2017-10-29 ENCOUNTER — Telehealth: Payer: Self-pay | Admitting: Internal Medicine

## 2017-10-29 ENCOUNTER — Other Ambulatory Visit (INDEPENDENT_AMBULATORY_CARE_PROVIDER_SITE_OTHER): Payer: BLUE CROSS/BLUE SHIELD

## 2017-10-29 VITALS — BP 110/64 | HR 84 | Temp 97.8°F | Resp 16 | Ht 67.0 in | Wt 133.0 lb

## 2017-10-29 DIAGNOSIS — Z0000 Encounter for general adult medical examination without abnormal findings: Secondary | ICD-10-CM

## 2017-10-29 DIAGNOSIS — R351 Nocturia: Secondary | ICD-10-CM | POA: Diagnosis not present

## 2017-10-29 DIAGNOSIS — N401 Enlarged prostate with lower urinary tract symptoms: Secondary | ICD-10-CM | POA: Diagnosis not present

## 2017-10-29 DIAGNOSIS — R300 Dysuria: Secondary | ICD-10-CM

## 2017-10-29 LAB — LIPID PANEL
CHOL/HDL RATIO: 3
CHOLESTEROL: 187 mg/dL (ref 0–200)
HDL: 64.7 mg/dL (ref >39.00)
LDL Cholesterol: 97 mg/dL (ref 0–99)
NonHDL: 122.52
TRIGLYCERIDES: 126 mg/dL (ref 0.0–149.0)
VLDL: 25.2 mg/dL (ref 0.0–40.0)

## 2017-10-29 LAB — URINALYSIS, ROUTINE W REFLEX MICROSCOPIC
BILIRUBIN URINE: NEGATIVE
HGB URINE DIPSTICK: NEGATIVE
KETONES UR: NEGATIVE
Leukocytes, UA: NEGATIVE
NITRITE: NEGATIVE
Specific Gravity, Urine: >=1.03 — AB (ref 1.000–1.030)
Total Protein, Urine: NEGATIVE
Urine Glucose: NEGATIVE
Urobilinogen, UA: 0.2 (ref 0.0–1.0)
pH: 5.5 (ref 5.0–8.0)

## 2017-10-29 MED ORDER — ALFUZOSIN HCL ER 10 MG PO TB24: 10.0000 mg | ORAL_TABLET | Freq: Every day | ORAL | 1 refills | Status: DC

## 2017-10-29 NOTE — Patient Instructions (Signed)

## 2017-10-29 NOTE — Telephone Encounter (Signed)
Patient states he forgot to give Dr. Yetta BarreJones some information during visit.   He states when he eats sugar, even fruit with natural sugar. He becomes anger, short tempered as well as depressed.   He also states that when he exercises he is very tired afterwards, more tired than he should be.

## 2017-10-29 NOTE — Progress Notes (Signed)
Subjective:  Patient ID: Wayne Silva, male    DOB: [birth year]  Age: 57 y.o. MRN: [id number]  CC: Annual Exam   HPI Wayne SchmidtJohn Silva presents for a CPX.  He complains of a few episodes of dysuria over the last week.  He has had no sexual activity in a year but he wants to be screened for STIs.  He also complains over the last year of worsening nocturia, about 2-3 times a night, with urinary hesitancy, dribbling, and frequency.  Outpatient Medications Prior to Visit  Medication Sig Dispense Refill  . albuterol (VENTOLIN HFA) 108 (90 Base) MCG/ACT inhaler Inhale 1-2 puffs into the lungs every 6 (six) hours as needed for wheezing or shortness of breath. 18 g 0  . CIALIS 20 MG tablet TAKE 1 TABLET BY ORAL ROUTE EVERY DAY  10  . Mometasone Furoate (ASMANEX HFA) 100 MCG/ACT AERO Inhale 1 Act into the lungs 2 times daily at 12 noon and 4 pm. 13 g 0  . montelukast (SINGULAIR) 10 MG tablet TAKE 1 TABLET BY MOUTH AT BEDTIME 90 tablet 3  . Omega-3 Fatty Acids (FISH OIL PO) Take by mouth daily.    . sertraline (ZOLOFT) 50 MG tablet Take 1 tablet (50 mg total) by mouth daily. 90 tablet 3  . valACYclovir (VALTREX) 1000 MG tablet Take 1 tablet by mouth daily.    Marland Kitchen. ibuprofen (ADVIL,MOTRIN) 600 MG tablet Take 1 tablet (600 mg total) by mouth every 8 (eight) hours as needed. 90 tablet 2  . azithromycin (ZITHROMAX) 250 MG tablet 2 tabs po qd x 1 day; 1 tablet per day x 4 days; 6 tablet 0  . benzonatate (TESSALON) 100 MG capsule Take 1 capsule (100 mg total) by mouth 3 (three) times daily as needed. 20 capsule 0  . predniSONE (DELTASONE) 20 MG tablet Take 2 tablets x 2 days; then decrease to one per day 7 tablet 0   No facility-administered medications prior to visit.     ROS Review of Systems  Constitutional: Negative.  Negative for appetite change, chills, fatigue and fever.  HENT: Negative.   Eyes: Negative for visual disturbance.  Respiratory: Negative for cough, chest tightness, shortness of breath and  wheezing.   Cardiovascular: Negative for chest pain, palpitations and leg swelling.  Gastrointestinal: Negative for abdominal pain, constipation, diarrhea, nausea and vomiting.  Genitourinary: Positive for difficulty urinating and dysuria. Negative for decreased urine volume, discharge, flank pain, frequency, genital sores, hematuria, penile pain, penile swelling, scrotal swelling, testicular pain and urgency.  Musculoskeletal: Negative.   Skin: Negative.  Negative for color change and rash.  Neurological: Negative.  Negative for dizziness, weakness, light-headedness, numbness and headaches.  Hematological: Negative for adenopathy. Does not bruise/bleed easily.  Psychiatric/Behavioral: Negative.     Objective:  BP 110/64 (BP Location: Left Arm, Patient Position: Sitting, Cuff Size: Normal)   Pulse 84   Temp 97.8 F (36.6 C) (Oral)   Resp 16   Ht 5\' 7"  (1.702 m)   Wt 133 lb (60.3 kg)   SpO2 96%   BMI 20.83 kg/m   BP Readings from Last 3 Encounters:  10/29/17 110/64  10/19/17 106/68  12/02/16 110/70    Wt Readings from Last 3 Encounters:  10/29/17 133 lb (60.3 kg)  10/19/17 134 lb (60.8 kg)  12/02/16 138 lb (62.6 kg)    Physical Exam  Constitutional: He is oriented to person, place, and time. No distress.  HENT:  Mouth/Throat: Oropharynx is clear and moist. No oropharyngeal exudate.  Eyes: Conjunctivae are normal. No scleral icterus.  Neck: Normal range of motion. Neck supple. No JVD present. No thyromegaly present.  Cardiovascular: Normal rate and regular rhythm. Exam reveals no friction rub.  No murmur heard. Pulmonary/Chest: Effort normal and breath sounds normal. He has no wheezes.  Abdominal: Soft. Normal appearance and bowel sounds are normal. He exhibits no mass. There is no hepatosplenomegaly. There is no tenderness. No hernia. Hernia confirmed negative in the right inguinal area and confirmed negative in the left inguinal area.  Genitourinary: Rectum normal, testes  normal and penis normal. Rectal exam shows no external hemorrhoid, no internal hemorrhoid, no fissure, no mass, no tenderness, anal tone normal and guaiac negative stool. Prostate is enlarged (2+ smooth symm BPH). Prostate is not tender. Right testis shows no mass, no swelling and no tenderness. Right testis is descended. Left testis shows no mass, no swelling and no tenderness. Left testis is descended. Circumcised. No penile erythema or penile tenderness. No discharge found.  Musculoskeletal: Normal range of motion. He exhibits no edema, tenderness or deformity.  Lymphadenopathy:    He has no cervical adenopathy. No inguinal adenopathy noted on the right or left side.  Neurological: He is alert and oriented to person, place, and time.  Skin: Skin is warm and dry. No rash noted. He is not diaphoretic.  Vitals reviewed.   Lab Results  Component Value Date   WBC 5.5 08/05/2016   HGB 14.6 08/05/2016   HCT 42.9 08/05/2016   PLT 205.0 08/05/2016   GLUCOSE 92 08/05/2016   CHOL 187 10/29/2017   TRIG 126.0 10/29/2017   HDL 64.70 10/29/2017   LDLCALC 97 10/29/2017   ALT 11 08/05/2016   AST 18 08/05/2016   NA 137 08/05/2016   K 4.4 08/05/2016   CL 101 08/05/2016   CREATININE 0.78 08/05/2016   BUN 20 08/05/2016   CO2 30 08/05/2016   TSH 1.06 08/05/2016   PSA 1.2 07/25/2017    Wayne Silva Contrast  Result Date: 10/03/2014 CLINICAL DATA:  57 year old male with memory loss ongoing for 5 years. Initial encounter. EXAM: MRI HEAD WITHOUT CONTRAST TECHNIQUE: Multiplanar, multiecho pulse sequences of the brain and surrounding structures were obtained without intravenous contrast. COMPARISON:  None. FINDINGS: No acute infarct. No intracranial hemorrhage. No hydrocephalus or advanced atrophy. No intracranial mass lesion noted on this unenhanced exam. Major intracranial vascular structures are patent. Partial opacification right mastoid air cells without obstructing lesion of the eustachian tube noted.  Minimal mucosal thickening ethmoid sinus air cells and maxillary sinuses. Cervical medullary junction, pituitary region, pineal region and orbital structures unremarkable. IMPRESSION: Partial opacification right mastoid air cells otherwise negative unenhanced Wayne brain as noted above. Electronically Signed   By: Lacy Duverney M.D.   On: 10/03/2014 14:04    Assessment & Plan:   Demetrious was seen today for annual exam.  Diagnoses and all orders for this visit:  Routine general medical examination at a health care facility-exam completed, labs reviewed, vaccines reviewed, screening for colon cancer is up-to-date, patient education material was given. -     Lipid panel; Future -     Cancel: PSA; Future -     HIV antibody; Future  BPH associated with nocturia-I think this is driving the majority of his urinary symptoms.  Of asked him to start taking a peripheral alpha blocker for symptom relief. -     alfuzosin (UROXATRAL) 10 MG 24 hr tablet; Take 1 tablet (10 mg total) by mouth daily with breakfast.  Dysuria- His sx's, exam and urinalysis are not consistent with an infection.  I will screen him for syphilis, gonorrhea, and chlamydia. -     Urinalysis, Routine w reflex microscopic; Future -     Cancel: GC/Chlamydia Probe Amp; Future -     RPR; Future -     GC/Chlamydia Probe Amp; Future   I have discontinued Reagen Wilbourne's ibuprofen, azithromycin, benzonatate, and predniSONE. I am also having him start on alfuzosin. Additionally, I am having him maintain his valACYclovir, Omega-3 Fatty Acids (FISH OIL PO), CIALIS, montelukast, sertraline, Mometasone Furoate, and albuterol.  Meds ordered this encounter  Medications  . alfuzosin (UROXATRAL) 10 MG 24 hr tablet    Sig: Take 1 tablet (10 mg total) by mouth daily with breakfast.    Dispense:  90 tablet    Refill:  1     Follow-up: Return if symptoms worsen or fail to improve.  Sanda Linger, MD

## 2017-10-30 LAB — RPR: RPR Ser Ql: NONREACTIVE

## 2017-10-30 LAB — HIV ANTIBODY (ROUTINE TESTING W REFLEX): HIV 1&2 Ab, 4th Generation: NONREACTIVE

## 2017-10-31 ENCOUNTER — Encounter: Payer: Self-pay | Admitting: Internal Medicine

## 2017-10-31 LAB — GC/CHLAMYDIA PROBE AMP
Chlamydia trachomatis, NAA: NEGATIVE
NEISSERIA GONORRHOEAE BY PCR: NEGATIVE

## 2017-12-03 ENCOUNTER — Ambulatory Visit: Payer: BLUE CROSS/BLUE SHIELD | Admitting: Family Medicine

## 2017-12-03 ENCOUNTER — Encounter: Payer: Self-pay | Admitting: Family Medicine

## 2017-12-03 VITALS — BP 112/80 | HR 67 | Ht 67.0 in | Wt 131.0 lb

## 2017-12-03 DIAGNOSIS — S30812A Abrasion of penis, initial encounter: Secondary | ICD-10-CM

## 2017-12-03 NOTE — Progress Notes (Signed)
Wayne Silva - 57 y.o. male MRN [id number]  Date of birth: [birth year]  SUBJECTIVE:  Including CC & ROS.  No chief complaint on file.   Wayne Silva is a 57 y.o. male that is is presenting with concern for an STD.  He presents with an abrasion on the dorsal aspect of his glans penis.  He reports having a sexual encounter on Monday and noticed this on Tuesday morning.  He denies any redness, swelling, or discharge.  He denies any inguinal lymphadenopathy.  He denies having fevers or chills.  His partner was reported to have no sexually transmitted disease.  He used protection during component of the session.    Review of Systems  Constitutional: Negative for fever.  HENT: Negative for congestion.   Respiratory: Negative for cough.   Cardiovascular: Negative for chest pain.  Gastrointestinal: Negative for abdominal pain.  Genitourinary: Negative for discharge, dysuria and scrotal swelling.  Musculoskeletal: Negative for back pain.  Skin: Negative for color change.  Neurological: Negative for weakness.  Hematological: Negative for adenopathy.  Psychiatric/Behavioral: Negative for agitation.    HISTORY: Past Medical, Surgical, Social, and Family History Reviewed & Updated per EMR.   Pertinent Historical Findings include:  Past Medical History:  Diagnosis Date  . Alcoholic (HCC)   . Anxiety   . Asthma   . Spastic bladder     Past Surgical History:  Procedure Laterality Date  . HEMORRHOID SURGERY    . KIDNEY STONE SURGERY      No Known Allergies  Family History  Problem Relation Age of Onset  . Skin cancer Mother   . Pancreatic cancer Mother   . Colon cancer Father   . Alzheimer's disease Father   . Prostate cancer Paternal Grandfather   . Alzheimer's disease Maternal Grandmother      Social History   Socioeconomic History  . Marital status: Single    Spouse name: Not on file  . Number of children: Not on file  . Years of education: Not on file  . Highest education  level: Not on file  Occupational History  . Not on file  Social Needs  . Financial resource strain: Not on file  . Food insecurity:    Worry: Not on file    Inability: Not on file  . Transportation needs:    Medical: Not on file    Non-medical: Not on file  Tobacco Use  . Smoking status: Former Smoker    Last attempt to quit: 08/22/2004    Years since quitting: 13.2  . Smokeless tobacco: Never Used  . Tobacco comment: social smoker  Substance and Sexual Activity  . Alcohol use: No    Alcohol/week: 0.0 oz    Comment: was an alcoholic (quit 10 years ago)  . Drug use: No  . Sexual activity: Yes    Partners: Female    Birth control/protection: Condom  Lifestyle  . Physical activity:    Days per week: Not on file    Minutes per session: Not on file  . Stress: Not on file  Relationships  . Social connections:    Talks on phone: Not on file    Gets together: Not on file    Attends religious service: Not on file    Active member of club or organization: Not on file    Attends meetings of clubs or organizations: Not on file    Relationship status: Not on file  . Intimate partner violence:    Fear of current  or ex partner: Not on file    Emotionally abused: Not on file    Physically abused: Not on file    Forced sexual activity: Not on file  Other Topics Concern  . Not on file  Social History Narrative  . Not on file     PHYSICAL EXAM:  VS: BP 112/80 (BP Location: Left Arm, Patient Position: Sitting, Cuff Size: Normal)   Pulse 67   Ht 5\' 7"  (1.702 m)   Wt 131 lb (59.4 kg)   SpO2 97%   BMI 20.52 kg/m  Physical Exam Gen: NAD, alert, cooperative with exam, well-appearing ENT: normal lips, normal nasal mucosa,  Eye: normal EOM, normal conjunctiva and lids CV:  no edema, +2 pedal pulses   Resp: no accessory muscle use, non-labored,  GI: no masses or tenderness, no hernia GU: linear small abrasion 1-2 mm on the dorsal aspect of his glans penis, no inguinal  lymphadenopathy, no tenderness in the groin, no scrotal tenderness, no discharge appreciated Skin: no rashes, no areas of induration  Neuro: normal tone, normal sensation to touch Psych:  normal insight, alert and oriented MSK: normal gait, normal strength      ASSESSMENT & PLAN:   Abrasion of penis Noticed the lesion the morning after having coitus. Unlikely for STD. Appears to be improving  - reassurance  - if still present after 7-10 days then can consider STD testing  - counseled on safe sex practices.

## 2017-12-03 NOTE — Assessment & Plan Note (Signed)
Noticed the lesion the morning after having coitus. Unlikely for STD. Appears to be improving  - reassurance  - if still present after 7-10 days then can consider STD testing  - counseled on safe sex practices.

## 2017-12-03 NOTE — Patient Instructions (Signed)
Nice to meet you  Please given me a call back if your symptoms don't improve.

## 2017-12-13 ENCOUNTER — Other Ambulatory Visit: Payer: Self-pay | Admitting: Internal Medicine

## 2017-12-13 DIAGNOSIS — J45991 Cough variant asthma: Secondary | ICD-10-CM

## 2018-01-02 ENCOUNTER — Other Ambulatory Visit: Payer: Self-pay | Admitting: Internal Medicine

## 2018-02-12 ENCOUNTER — Encounter: Payer: Self-pay | Admitting: Family

## 2018-02-12 ENCOUNTER — Other Ambulatory Visit (INDEPENDENT_AMBULATORY_CARE_PROVIDER_SITE_OTHER): Payer: BLUE CROSS/BLUE SHIELD

## 2018-02-12 ENCOUNTER — Ambulatory Visit: Payer: BLUE CROSS/BLUE SHIELD | Admitting: Family

## 2018-02-12 VITALS — BP 114/74 | HR 86 | Temp 97.4°F | Ht 67.0 in | Wt 135.1 lb

## 2018-02-12 DIAGNOSIS — R5383 Other fatigue: Secondary | ICD-10-CM

## 2018-02-12 DIAGNOSIS — B9789 Other viral agents as the cause of diseases classified elsewhere: Secondary | ICD-10-CM | POA: Diagnosis not present

## 2018-02-12 DIAGNOSIS — Z23 Encounter for immunization: Secondary | ICD-10-CM

## 2018-02-12 DIAGNOSIS — Z113 Encounter for screening for infections with a predominantly sexual mode of transmission: Secondary | ICD-10-CM | POA: Diagnosis not present

## 2018-02-12 DIAGNOSIS — J069 Acute upper respiratory infection, unspecified: Secondary | ICD-10-CM | POA: Diagnosis not present

## 2018-02-12 LAB — COMPREHENSIVE METABOLIC PANEL
ALBUMIN: 4.3 g/dL (ref 3.5–5.2)
ALK PHOS: 57 U/L (ref 39–117)
ALT: 11 U/L (ref 0–53)
AST: 13 U/L (ref 0–37)
BILIRUBIN TOTAL: 0.4 mg/dL (ref 0.2–1.2)
BUN: 31 mg/dL — ABNORMAL HIGH (ref 6–23)
CALCIUM: 9.5 mg/dL (ref 8.4–10.5)
CO2: 30 mEq/L (ref 19–32)
Chloride: 107 mEq/L (ref 96–112)
Creatinine, Ser: 0.98 mg/dL (ref 0.40–1.50)
GFR: 83.93 mL/min (ref >60.00)
Glucose, Bld: 112 mg/dL — ABNORMAL HIGH (ref 70–99)
Potassium: 4.1 mEq/L (ref 3.5–5.1)
Sodium: 142 mEq/L (ref 135–145)
Total Protein: 7 g/dL (ref 6.0–8.3)

## 2018-02-12 LAB — TSH: TSH: 1.19 u[IU]/mL (ref 0.35–4.50)

## 2018-02-12 LAB — CBC WITH DIFFERENTIAL/PLATELET
BASOS ABS: 0.1 10*3/uL (ref 0.0–0.1)
Basophils Relative: 1.2 % (ref 0.0–3.0)
Eosinophils Absolute: 0.2 10*3/uL (ref 0.0–0.7)
Eosinophils Relative: 2.5 % (ref 0.0–5.0)
HEMATOCRIT: 45.1 % (ref 39.0–52.0)
HEMOGLOBIN: 15.4 g/dL (ref 13.0–17.0)
LYMPHS PCT: 18.7 % (ref 12.0–46.0)
Lymphs Abs: 1.2 10*3/uL (ref 0.7–4.0)
MCHC: 34.1 g/dL (ref 30.0–36.0)
MCV: 89.9 fl (ref 78.0–100.0)
Monocytes Absolute: 0.5 10*3/uL (ref 0.1–1.0)
Monocytes Relative: 8.4 % (ref 3.0–12.0)
Neutro Abs: 4.4 10*3/uL (ref 1.4–7.7)
Neutrophils Relative %: 69.2 % (ref 43.0–77.0)
PLATELETS: 183 10*3/uL (ref 150.0–400.0)
RBC: 5.02 Mil/uL (ref 4.22–5.81)
RDW: 13 % (ref 11.5–15.5)
WBC: 6.4 10*3/uL (ref 4.0–10.5)

## 2018-02-12 NOTE — Progress Notes (Signed)
Wayne Silva is a 57 y.o. male with the following history as recorded in Epic[facility]:  Patient Active Problem List   Diagnosis Date Noted  . Abrasion of penis 12/03/2017  . BPH associated with nocturia 10/29/2017  . Dysuria 10/29/2017  . MCI (mild cognitive impairment) 05/01/2014  . Cough variant asthma 05/01/2014  . Routine general medical examination at a health care facility 08/26/2013  . IC (interstitial cystitis) 08/26/2013  . GAD (generalized anxiety disorder) 03/02/2013    Current Outpatient Medications  Medication Sig Dispense Refill  . albuterol (VENTOLIN HFA) 108 (90 Base) MCG/ACT inhaler Inhale 1-2 puffs into the lungs every 6 (six) hours as needed for wheezing or shortness of breath. 18 g 0  . alfuzosin (UROXATRAL) 10 MG 24 hr tablet Take 1 tablet (10 mg total) by mouth daily with breakfast. 90 tablet 1  . ASMANEX HFA 100 MCG/ACT AERO INHALE 1 ACT INTO THE LUNGS 2 TIMES DAILY AT 12 NOON AND 4 PM. 39 Inhaler 1  . CIALIS 20 MG tablet TAKE 1 TABLET BY ORAL ROUTE EVERY DAY  10  . montelukast (SINGULAIR) 10 MG tablet TAKE 1 TABLET BY MOUTH AT BEDTIME 90 tablet 3  . Omega-3 Fatty Acids (FISH OIL PO) Take by mouth daily.    . valACYclovir (VALTREX) 1000 MG tablet Take 1 tablet by mouth daily.     No current facility-administered medications for this visit.     Allergies: Patient has no known allergies.  Past Medical History:  Diagnosis Date  . Alcoholic ([place])   . Anxiety   . Asthma   . Spastic bladder     Past Surgical History:  Procedure Laterality Date  . HEMORRHOID SURGERY    . KIDNEY STONE SURGERY      Family History  Problem Relation Age of Onset  . Skin cancer Mother   . Pancreatic cancer Mother   . Colon cancer Father   . Alzheimer's disease Father   . Prostate cancer Paternal Grandfather   . Alzheimer's disease Maternal Grandmother     Social History   Tobacco Use  . Smoking status: Former Smoker    Last attempt to quit: 08/22/2004    Years since quitting:  13.4  . Smokeless tobacco: Never Used  . Tobacco comment: social smoker  Substance Use Topics  . Alcohol use: No    Alcohol/week: 0.0 standard drinks    Comment: was an alcoholic (quit 10 years ago)    Subjective:  Requesting STD screen today- has recently ended relationship; Has "bumps" on his penis- wants reassurance that not any type of infection; Also endorses being more tired recently.   Objective:  Vitals:   02/12/18 1508  BP: 114/74  Pulse: 86  Temp: (!) 97.4 F (36.3 C)  TempSrc: Oral  SpO2: 96%  Weight: 135 lb 1.9 oz (61.3 kg)  Height: 5' 7"  (1.702 m)    General: Well developed, well nourished, in no acute distress  Skin : Warm and dry.  Head: Normocephalic and atraumatic  Lungs: Respirations unlabored; clear to auscultation bilaterally without wheeze, rales, rhonchi  CVS exam: normal rate and regular rhythm.  Neurologic: Alert and oriented; speech intact; face symmetrical; moves all extremities well; CNII-XII intact without focal deficit  Penis has few scattered hemangiomas; Assessment:  1. Screen for STD (sexually transmitted disease)   2. Other fatigue     Plan:  Reassurance regarding skin concerns; labs updated as requested;  Flu shot given.  No follow-ups on file.  Orders Placed This  Encounter  Procedures  . GC/Chlamydia Probe Amp([facility])    Standing Status:   Future    Number of Occurrences:   1    Standing Expiration Date:   02/13/2019  . HIV Antibody (routine testing w rflx)    Standing Status:   Future    Number of Occurrences:   1    Standing Expiration Date:   02/13/2019  . RPR    Standing Status:   Future    Number of Occurrences:   1    Standing Expiration Date:   02/12/2019  . CBC w/Diff    Standing Status:   Future    Number of Occurrences:   1    Standing Expiration Date:   02/12/2019  . Comp Met (CMET)    Standing Status:   Future    Number of Occurrences:   1    Standing Expiration Date:   02/12/2019  . TSH    Standing Status:    Future    Number of Occurrences:   1    Standing Expiration Date:   02/12/2019    Requested Prescriptions    No prescriptions requested or ordered in this encounter

## 2018-02-15 LAB — HIV ANTIBODY (ROUTINE TESTING W REFLEX): HIV 1&2 Ab, 4th Generation: NONREACTIVE

## 2018-02-15 LAB — RPR: RPR Ser Ql: NONREACTIVE

## 2018-02-16 LAB — GC/CHLAMYDIA PROBE AMP
CHLAMYDIA, DNA PROBE: NEGATIVE
NEISSERIA GONORRHOEAE BY PCR: NEGATIVE

## 2018-03-04 DIAGNOSIS — N3010 Interstitial cystitis (chronic) without hematuria: Secondary | ICD-10-CM | POA: Diagnosis not present

## 2018-03-08 ENCOUNTER — Ambulatory Visit: Payer: BLUE CROSS/BLUE SHIELD | Admitting: Internal Medicine

## 2018-03-08 ENCOUNTER — Encounter: Payer: Self-pay | Admitting: Internal Medicine

## 2018-03-08 VITALS — BP 106/68 | HR 68 | Temp 98.4°F | Ht 67.0 in | Wt 134.0 lb

## 2018-03-08 DIAGNOSIS — B9789 Other viral agents as the cause of diseases classified elsewhere: Secondary | ICD-10-CM | POA: Diagnosis not present

## 2018-03-08 DIAGNOSIS — J069 Acute upper respiratory infection, unspecified: Secondary | ICD-10-CM | POA: Diagnosis not present

## 2018-03-08 DIAGNOSIS — Z23 Encounter for immunization: Secondary | ICD-10-CM

## 2018-03-08 MED ORDER — DEXTROMETHORPHAN POLISTIREX ER 30 MG/5ML PO SUER: 30.0000 mg | Freq: Two times a day (BID) | ORAL | 0 refills | Status: DC

## 2018-03-08 NOTE — Progress Notes (Signed)
Subjective:  Patient ID: Wayne Silva, male    DOB: Oct 05, 1960  Age: 57 y.o. MRN: [id number]  CC: URI   HPI Wayne Silva presents for a 5-day history of nonproductive cough, sore throat, and nasal congestion.  Outpatient Medications Prior to Visit  Medication Sig Dispense Refill  . albuterol (VENTOLIN HFA) 108 (90 Base) MCG/ACT inhaler Inhale 1-2 puffs into the lungs every 6 (six) hours as needed for wheezing or shortness of breath. 18 g 0  . alfuzosin (UROXATRAL) 10 MG 24 hr tablet Take 1 tablet (10 mg total) by mouth daily with breakfast. 90 tablet 1  . ASMANEX HFA 100 MCG/ACT AERO INHALE 1 ACT INTO THE LUNGS 2 TIMES DAILY AT 12 NOON AND 4 PM. 39 Inhaler 1  . CIALIS 20 MG tablet TAKE 1 TABLET BY ORAL ROUTE EVERY DAY  10  . montelukast (SINGULAIR) 10 MG tablet TAKE 1 TABLET BY MOUTH AT BEDTIME 90 tablet 3  . Omega-3 Fatty Acids (FISH OIL PO) Take by mouth daily.    . valACYclovir (VALTREX) 1000 MG tablet Take 1 tablet by mouth daily.     No facility-administered medications prior to visit.     ROS Review of Systems  Constitutional: Negative for chills, fatigue and fever.  HENT: Positive for congestion and sore throat. Negative for facial swelling, sinus pressure and trouble swallowing.   Respiratory: Positive for cough. Negative for shortness of breath and wheezing.   Cardiovascular: Negative for chest pain.  Gastrointestinal: Negative for abdominal pain and diarrhea.  Endocrine: Negative.   Genitourinary: Negative.  Negative for difficulty urinating.  Musculoskeletal: Negative.  Negative for arthralgias and myalgias.  Skin: Negative for color change, pallor and rash.  Neurological: Negative.  Negative for dizziness, weakness and light-headedness.  Hematological: Negative for adenopathy. Does not bruise/bleed easily.  Psychiatric/Behavioral: Negative.     Objective:  BP 106/68   Pulse 68   Temp 98.4 F (36.9 C) (Oral)   Ht 5\' 7"  (1.702 m)   Wt 134 lb (60.8 kg)   SpO2 98%    BMI 20.99 kg/m   BP Readings from Last 3 Encounters:  03/08/18 106/68  02/12/18 114/74  12/03/17 112/80    Wt Readings from Last 3 Encounters:  03/08/18 134 lb (60.8 kg)  02/12/18 135 lb 1.9 oz (61.3 kg)  12/03/17 131 lb (59.4 kg)    Physical Exam  Constitutional: He is oriented to person, place, and time. No distress.  HENT:  Mouth/Throat: Mucous membranes are not pale. Posterior oropharyngeal erythema present. No oropharyngeal exudate or posterior oropharyngeal edema.  Eyes: Conjunctivae are normal. No scleral icterus.  Neck: Normal range of motion. Neck supple. No JVD present. No thyromegaly present.  Cardiovascular: Normal rate, regular rhythm and normal heart sounds. Exam reveals no gallop.  No murmur heard. Pulmonary/Chest: Effort normal and breath sounds normal. No respiratory distress. He has no wheezes. He has no rales.  Abdominal: Soft. Bowel sounds are normal. He exhibits no mass. There is no hepatosplenomegaly. There is no tenderness.  Musculoskeletal: Normal range of motion. He exhibits no edema, tenderness or deformity.  Lymphadenopathy:    He has no cervical adenopathy.  Neurological: He is alert and oriented to person, place, and time.  Skin: Skin is warm and dry. No rash noted. He is not diaphoretic. No erythema. No pallor.  Vitals reviewed.   Lab Results  Component Value Date   WBC 6.4 02/12/2018   HGB 15.4 02/12/2018   HCT 45.1 02/12/2018   PLT 183.0  02/12/2018   GLUCOSE 112 (H) 02/12/2018   CHOL 187 10/29/2017   TRIG 126.0 10/29/2017   HDL 64.70 10/29/2017   LDLCALC 97 10/29/2017   ALT 11 02/12/2018   AST 13 02/12/2018   NA 142 02/12/2018   K 4.1 02/12/2018   CL 107 02/12/2018   CREATININE 0.98 02/12/2018   BUN 31 (H) 02/12/2018   CO2 30 02/12/2018   TSH 1.19 02/12/2018   PSA 1.2 07/25/2017    [name] Contrast  Result Date: 10/03/2014 CLINICAL DATA:  57 year old male with memory loss ongoing for 5 years. Initial encounter. EXAM: MRI  HEAD WITHOUT CONTRAST TECHNIQUE: Multiplanar, multiecho pulse sequences of the brain and surrounding structures were obtained without intravenous contrast. COMPARISON:  None. FINDINGS: No acute infarct. No intracranial hemorrhage. No hydrocephalus or advanced atrophy. No intracranial mass lesion noted on this unenhanced exam. Major intracranial vascular structures are patent. Partial opacification right mastoid air cells without obstructing lesion of the eustachian tube noted. Minimal mucosal thickening ethmoid sinus air cells and maxillary sinuses. Cervical medullary junction, pituitary region, pineal region and orbital structures unremarkable. IMPRESSION: Partial opacification right mastoid air cells otherwise negative unenhanced MR brain as noted above. Electronically Signed   By: Lacy Duverney M.D.   On: 10/03/2014 14:04    Assessment & Plan:   [name] was seen today for uri.  Diagnoses and all orders for this visit:  Need for influenza vaccination -     Flu Vaccine QUAD 36+ mos IM  Viral URI with cough -     dextromethorphan (DELSYM) 30 MG/5ML liquid; Take 5 mLs (30 mg total) by mouth 2 (two) times daily.   I am having [name] start on dextromethorphan. I am also having him maintain his valACYclovir, Omega-3 Fatty Acids (FISH OIL PO), CIALIS, albuterol, alfuzosin, ASMANEX HFA, and montelukast.  Meds ordered this encounter  Medications  . dextromethorphan (DELSYM) 30 MG/5ML liquid    Sig: Take 5 mLs (30 mg total) by mouth 2 (two) times daily.    Dispense:  148 mL    Refill:  0     Follow-up: Return if symptoms worsen or fail to improve.  Sanda Linger, MD

## 2018-03-08 NOTE — Patient Instructions (Signed)
Upper Respiratory Infection, Adult Most upper respiratory infections (URIs) are caused by a virus. A URI affects the nose, throat, and upper air passages. The most common type of URI is often called "the common cold." Follow these instructions at home:  Take medicines only as told by your doctor.  Gargle warm saltwater or take cough drops to comfort your throat as told by your doctor.  Use a warm mist humidifier or inhale steam from a shower to increase air moisture. This may make it easier to breathe.  Drink enough fluid to keep your pee (urine) clear or pale yellow.  Eat soups and other clear broths.  Have a healthy diet.  Rest as needed.  Go back to work when your fever is gone or your doctor says it is okay. ? You may need to stay home longer to avoid giving your URI to others. ? You can also wear a face mask and wash your hands often to prevent spread of the virus.  Use your inhaler more if you have asthma.  Do not use any tobacco products, including cigarettes, chewing tobacco, or electronic cigarettes. If you need help quitting, ask your doctor. Contact a doctor if:  You are getting worse, not better.  Your symptoms are not helped by medicine.  You have chills.  You are getting more short of breath.  You have brown or red mucus.  You have yellow or brown discharge from your nose.  You have pain in your face, especially when you bend forward.  You have a fever.  You have puffy (swollen) neck glands.  You have pain while swallowing.  You have white areas in the back of your throat. Get help right away if:  You have very bad or constant: ? Headache. ? Ear pain. ? Pain in your forehead, behind your eyes, and over your cheekbones (sinus pain). ? Chest pain.  You have long-lasting (chronic) lung disease and any of the following: ? Wheezing. ? Long-lasting cough. ? Coughing up blood. ? A change in your usual mucus.  You have a stiff neck.  You have  changes in your: ? Vision. ? Hearing. ? Thinking. ? Mood. This information is not intended to replace advice given to you by your health care provider. Make sure you discuss any questions you have with your health care provider. Document Released: 10/15/2007 Document Revised: 12/30/2015 Document Reviewed: 08/03/2013 Elsevier Interactive Patient Education  2018 [employer]  

## 2018-03-10 ENCOUNTER — Other Ambulatory Visit: Payer: Self-pay | Admitting: Internal Medicine

## 2018-03-10 ENCOUNTER — Telehealth: Payer: Self-pay | Admitting: Internal Medicine

## 2018-03-10 DIAGNOSIS — J988 Other specified respiratory disorders: Secondary | ICD-10-CM | POA: Insufficient documentation

## 2018-03-10 MED ORDER — AZITHROMYCIN 500 MG PO TABS: 500.0000 mg | ORAL_TABLET | Freq: Every day | ORAL | 0 refills | Status: AC

## 2018-03-10 NOTE — Telephone Encounter (Signed)
Pt is requesting an abx - cough sx are worsening. Please advise if this can be sent in for pt.

## 2018-03-10 NOTE — Telephone Encounter (Signed)
Copied from CRM [id number]. Topic: Quick Communication - See Telephone Encounter >> Mar 10, 2018  8:33 AM Arlyss Gandy, NT wrote: CRM for notification. See Telephone encounter for: 03/10/18. Pt states he came in on Monday, but today his cough has been worse at night since the appointment. He would like to see if a antibiotic can be ordered to help him fight this cold off. [id number], [place] - [address] [phone] (Phone) [phone] (Fax)

## 2018-03-12 ENCOUNTER — Encounter: Payer: Self-pay | Admitting: Nurse Practitioner

## 2018-03-12 ENCOUNTER — Ambulatory Visit: Payer: BLUE CROSS/BLUE SHIELD | Admitting: Nurse Practitioner

## 2018-03-12 VITALS — BP 108/78 | HR 77 | Temp 98.1°F | Ht 67.0 in | Wt 130.0 lb

## 2018-03-12 DIAGNOSIS — R05 Cough: Secondary | ICD-10-CM | POA: Diagnosis not present

## 2018-03-12 DIAGNOSIS — R059 Cough, unspecified: Secondary | ICD-10-CM

## 2018-03-12 MED ORDER — PREDNISONE 20 MG PO TABS: 40.0000 mg | ORAL_TABLET | Freq: Every day | ORAL | 0 refills | Status: DC

## 2018-03-12 MED ORDER — HYDROCODONE-HOMATROPINE 5-1.5 MG/5ML PO SYRP: 5.0000 mL | ORAL_SOLUTION | Freq: Three times a day (TID) | ORAL | 0 refills | Status: DC | PRN

## 2018-03-12 NOTE — Patient Instructions (Addendum)
Start prednisone as prescribed, use albuterol 1 puff every 4-6 hours for the next 2 days.  Hycodan at night to help with cough/sleep. Hycodan can cause drowsiness. Please do not drink alcohol or operate machinery when you take this medication.  Please follow up for fevers over 101, if your symptoms get worse, or if your cough is not better in about 2 weeks.   Cough, Adult A cough helps to clear your throat and lungs. A cough may last only 2-3 weeks (acute), or it may last longer than 8 weeks (chronic). Many different things can cause a cough. A cough may be a sign of an illness or another medical condition. Follow these instructions at home:  Pay attention to any changes in your cough.  Take medicines only as told by your doctor. ? If you were prescribed an antibiotic medicine, take it as told by your doctor. Do not stop taking it even if you start to feel better. ? Talk with your doctor before you try using a cough medicine.  Drink enough fluid to keep your pee (urine) clear or pale yellow.  If the air is dry, use a cold steam vaporizer or humidifier in your home.  Stay away from things that make you cough at work or at home.  If your cough is worse at night, try using extra pillows to raise your head up higher while you sleep.  Do not smoke, and try not to be around smoke. If you need help quitting, ask your doctor.  Do not have caffeine.  Do not drink alcohol.  Rest as needed. Contact a doctor if:  You have new problems (symptoms).  You cough up yellow fluid (pus).  Your cough does not get better after 2-3 weeks, or your cough gets worse.  Medicine does not help your cough and you are not sleeping well.  You have pain that gets worse or pain that is not helped with medicine.  You have a fever.  You are losing weight and you do not know why.  You have night sweats. Get help right away if:  You cough up blood.  You have trouble breathing.  Your heartbeat is  very fast. This information is not intended to replace advice given to you by your health care provider. Make sure you discuss any questions you have with your health care provider. Document Released: 01/09/2011 Document Revised: 10/04/2015 Document Reviewed: 07/05/2014 Elsevier Interactive Patient Education  [employer].

## 2018-03-12 NOTE — Progress Notes (Signed)
Wayne Silva is a 57 y.o. male with the following history as recorded in EpicCare:  Patient Active Problem List   Diagnosis Date Noted  . RTI (respiratory tract infection) 03/10/2018  . Abrasion of penis 12/03/2017  . BPH associated with nocturia 10/29/2017  . MCI (mild cognitive impairment) 05/01/2014  . Cough variant asthma 05/01/2014  . Routine general medical examination at a health care facility 08/26/2013  . IC (interstitial cystitis) 08/26/2013  . GAD (generalized anxiety disorder) 03/02/2013    Current Outpatient Medications  Medication Sig Dispense Refill  . albuterol (VENTOLIN HFA) 108 (90 Base) MCG/ACT inhaler Inhale 1-2 puffs into the lungs every 6 (six) hours as needed for wheezing or shortness of breath. 18 g 0  . alfuzosin (UROXATRAL) 10 MG 24 hr tablet Take 1 tablet (10 mg total) by mouth daily with breakfast. 90 tablet 1  . ASMANEX HFA 100 MCG/ACT AERO INHALE 1 ACT INTO THE LUNGS 2 TIMES DAILY AT 12 NOON AND 4 PM. 39 Inhaler 1  . azithromycin (ZITHROMAX) 500 MG tablet Take 1 tablet (500 mg total) by mouth daily for 3 days. 3 tablet 0  . CIALIS 20 MG tablet TAKE 1 TABLET BY ORAL ROUTE EVERY DAY  10  . dextromethorphan (DELSYM) 30 MG/5ML liquid Take 5 mLs (30 mg total) by mouth 2 (two) times daily. 148 mL 0  . montelukast (SINGULAIR) 10 MG tablet TAKE 1 TABLET BY MOUTH AT BEDTIME 90 tablet 3  . Omega-3 Fatty Acids (FISH OIL PO) Take by mouth daily.    . valACYclovir (VALTREX) 1000 MG tablet Take 1 tablet by mouth daily.    Marland Kitchen HYDROcodone-homatropine (HYCODAN) 5-1.5 MG/5ML syrup Take 5 mLs by mouth every 8 (eight) hours as needed for cough. 120 mL 0  . predniSONE (DELTASONE) 20 MG tablet Take 2 tablets (40 mg total) by mouth daily with breakfast. 10 tablet 0   No current facility-administered medications for this visit.     Allergies: Patient has no known allergies.  Past Medical History:  Diagnosis Date  . Alcoholic (HCC)   . Anxiety   . Asthma   . Spastic bladder     Past Surgical History:  Procedure Laterality Date  . HEMORRHOID SURGERY    . KIDNEY STONE SURGERY      Family History  Problem Relation Age of Onset  . Skin cancer Mother   . Pancreatic cancer Mother   . Colon cancer Father   . Alzheimer's disease Father   . Prostate cancer Paternal Grandfather   . Alzheimer's disease Maternal Grandmother     Social History   Tobacco Use  . Smoking status: Former Smoker    Last attempt to quit: 08/22/2004    Years since quitting: 13.5  . Smokeless tobacco: Never Used  . Tobacco comment: social smoker  Substance Use Topics  . Alcohol use: No    Alcohol/week: 0.0 standard drinks    Comment: was an alcoholic (quit 10 years ago)     Subjective:  [name] is here today for follow up of cough, first began about 2 weeks ago along with viral symptoms, sore throat, was seen by his PCP on 10/28 and treated with mucinex, z-pak for URI, has completed medications and overall feeling better but just cant stop coughing and its keeping him awake at night. He denies fevers, chills, confusion, weakness, body aches, loss of appetite, chest pain, shortness of breath, wheezing. Cough is not productive. He is not a smoker Tried delsym at home but  did not seem to help cough. Hx asthma, using asthmanex daily as prescribed, rarely uses albuterol  ROS- See HPI  Objective:  Vitals:   03/12/18 1015  BP: 108/78  Pulse: 77  Temp: 98.1 F (36.7 C)  TempSrc: Oral  SpO2: 99%  Weight: 130 lb (59 kg)  Height: 5\' 7"  (1.702 m)    General: Well developed, well nourished, in no acute distress  Skin : Warm and dry.  Head: Normocephalic and atraumatic  Eyes: Sclera and conjunctiva clear; pupils round and reactive to light; extraocular movements intact  Ears: External normal; canals clear; tympanic membranes normal  Oropharynx: Pink, supple. No suspicious lesions  Neck: Supple without thyromegaly Lungs: Respirations unlabored; clear to auscultation bilaterally  without wheeze, rales, rhonchi  CVS exam: normal rate and regular rhythm, S1 and S2 normal.  Extremities: No edema, cyanosis Vessels: Symmetric bilaterally  Neurologic: Alert and oriented; speech intact; face symmetrical; moves all extremities well; CNII-XII intact without focal deficit    Assessment:  1. Cough     Plan:   Cough Overall symptoms have improved, we discussed that cough can persist several weeks beyond URI Will start course of prednisone, schedule albuterol due to history asthma Hycodan PRN to help with cough and sleep- dosing and side effects discussed Home management, red flags and return precautions including when to seek immediate care discussed and printed on AVS  - predniSONE (DELTASONE) 20 MG tablet; Take 2 tablets (40 mg total) by mouth daily with breakfast.  Dispense: 10 tablet; Refill: 0 - HYDROcodone-homatropine (HYCODAN) 5-1.5 MG/5ML syrup; Take 5 mLs by mouth every 8 (eight) hours as needed for cough.  Dispense: 120 mL; Refill: 0   No follow-ups on file.  No orders of the defined types were placed in this encounter.   Requested Prescriptions   Signed Prescriptions Disp Refills  . predniSONE (DELTASONE) 20 MG tablet 10 tablet 0    Sig: Take 2 tablets (40 mg total) by mouth daily with breakfast.  . HYDROcodone-homatropine (HYCODAN) 5-1.5 MG/5ML syrup 120 mL 0    Sig: Take 5 mLs by mouth every 8 (eight) hours as needed for cough.

## 2018-05-04 ENCOUNTER — Ambulatory Visit: Payer: BLUE CROSS/BLUE SHIELD | Admitting: Family

## 2018-05-04 ENCOUNTER — Encounter: Payer: Self-pay | Admitting: Family

## 2018-05-04 VITALS — BP 112/68 | HR 82 | Temp 98.1°F | Ht 67.0 in | Wt 133.1 lb

## 2018-05-04 DIAGNOSIS — J45909 Unspecified asthma, uncomplicated: Secondary | ICD-10-CM | POA: Diagnosis not present

## 2018-05-04 DIAGNOSIS — J019 Acute sinusitis, unspecified: Secondary | ICD-10-CM

## 2018-05-04 MED ORDER — DOXYCYCLINE HYCLATE 100 MG PO TABS: 100.0000 mg | ORAL_TABLET | Freq: Two times a day (BID) | ORAL | 0 refills | Status: DC

## 2018-05-04 MED ORDER — FLUTICASONE FUROATE-VILANTEROL 200-25 MCG/INH IN AEPB: 1.0000 | INHALATION_SPRAY | Freq: Every day | RESPIRATORY_TRACT | 6 refills | Status: DC

## 2018-05-04 MED ORDER — PREDNISONE 20 MG PO TABS: 20.0000 mg | ORAL_TABLET | Freq: Every day | ORAL | 0 refills | Status: DC

## 2018-05-04 NOTE — Progress Notes (Signed)
Drucilla SchmidtJohn Bourcier is a 57 y.o. male with the following history as recorded in EpicCare:  Patient Active Problem List   Diagnosis Date Noted  . RTI (respiratory tract infection) 03/10/2018  . Abrasion of penis 12/03/2017  . BPH associated with nocturia 10/29/2017  . MCI (mild cognitive impairment) 05/01/2014  . Cough variant asthma 05/01/2014  . Routine general medical examination at a health care facility 08/26/2013  . IC (interstitial cystitis) 08/26/2013  . GAD (generalized anxiety disorder) 03/02/2013    Current Outpatient Medications  Medication Sig Dispense Refill  . albuterol (VENTOLIN HFA) 108 (90 Base) MCG/ACT inhaler Inhale 1-2 puffs into the lungs every 6 (six) hours as needed for wheezing or shortness of breath. 18 g 0  . alfuzosin (UROXATRAL) 10 MG 24 hr tablet Take 1 tablet (10 mg total) by mouth daily with breakfast. 90 tablet 1  . CIALIS 20 MG tablet TAKE 1 TABLET BY ORAL ROUTE EVERY DAY  10  . doxycycline (VIBRA-TABS) 100 MG tablet Take 1 tablet (100 mg total) by mouth 2 (two) times daily. 20 tablet 0  . fluticasone furoate-vilanterol (BREO ELLIPTA) 200-25 MCG/INH AEPB Inhale 1 puff into the lungs daily. 28 each 6  . montelukast (SINGULAIR) 10 MG tablet TAKE 1 TABLET BY MOUTH AT BEDTIME 90 tablet 3  . Omega-3 Fatty Acids (FISH OIL PO) Take by mouth daily.    . predniSONE (DELTASONE) 20 MG tablet Take 1 tablet (20 mg total) by mouth daily with breakfast. 5 tablet 0  . valACYclovir (VALTREX) 1000 MG tablet Take 1 tablet by mouth daily.     No current facility-administered medications for this visit.     Allergies: Patient has no known allergies.  Past Medical History:  Diagnosis Date  . Alcoholic (HCC)   . Anxiety   . Asthma   . Spastic bladder     Past Surgical History:  Procedure Laterality Date  . HEMORRHOID SURGERY    . KIDNEY STONE SURGERY      Family History  Problem Relation Age of Onset  . Skin cancer Mother   . Pancreatic cancer Mother   . Colon cancer  Father   . Alzheimer's disease Father   . Prostate cancer Paternal Grandfather   . Alzheimer's disease Maternal Grandmother     Social History   Tobacco Use  . Smoking status: Former Smoker    Last attempt to quit: 08/22/2004    Years since quitting: 13.7  . Smokeless tobacco: Never Used  . Tobacco comment: social smoker  Substance Use Topics  . Alcohol use: No    Alcohol/week: 0.0 standard drinks    Comment: was an alcoholic (quit 10 years ago)    Subjective:  Patient presents with cough/ congestion x 1 week; prone to bronchitis; was seen with similar symptoms in early November- took Zithromax and prednisone; thought symptoms cleared completely; does not use his Asmanex twice a day- often forgets second dosage; takes Singulair daily; No fever, no chest pain, no shortness of breath; feels drainage in back of throat;     Objective:  Vitals:   05/04/18 1025  BP: 112/68  Pulse: 82  Temp: 98.1 F (36.7 C)  TempSrc: Oral  SpO2: 98%  Weight: 133 lb 1.3 oz (60.4 kg)  Height: 5\' 7"  (1.702 m)    General: Well developed, well nourished, in no acute distress  Skin : Warm and dry.  Head: Normocephalic and atraumatic  Eyes: Sclera and conjunctiva clear; pupils round and reactive to light; extraocular movements  intact  Ears: External normal; canals clear; tympanic membranes normal  Oropharynx: Pink, supple. No suspicious lesions  Neck: Supple without thyromegaly, adenopathy  Lungs: Respirations unlabored; clear to auscultation bilaterally without wheeze, rales, rhonchi  CVS exam: normal rate and regular rhythm.  Neurologic: Alert and oriented; speech intact; face symmetrical; moves all extremities well; CNII-XII intact without focal deficit   Assessment:  1. Acute sinusitis, recurrence not specified, unspecified location   2. Acute asthmatic bronchitis     Plan:  Do not feel underlying asthma/ allergies are controlled and this is contributing to recurrent flares; For current  flare, will treat with Doxycycline 100 mg bid x 10 days and Prednisone 20 mg qd x 5 days; also go ahead and change to BREO 200 daily and add Zyrtec 10 mg qhs; He understands that BREO, Singulair and Zyrtec are to be continued for chronic asthma/ allergy maintenance; he will call and follow-up with his response in 2 weeks.    No follow-ups on file.  No orders of the defined types were placed in this encounter.   Requested Prescriptions   Signed Prescriptions Disp Refills  . doxycycline (VIBRA-TABS) 100 MG tablet 20 tablet 0    Sig: Take 1 tablet (100 mg total) by mouth 2 (two) times daily.  . fluticasone furoate-vilanterol (BREO ELLIPTA) 200-25 MCG/INH AEPB 28 each 6    Sig: Inhale 1 puff into the lungs daily.  . predniSONE (DELTASONE) 20 MG tablet 5 tablet 0    Sig: Take 1 tablet (20 mg total) by mouth daily with breakfast.

## 2018-05-04 NOTE — Patient Instructions (Signed)
Start Zyrtec 10 mg (Cetirizine) at  Night;

## 2018-05-08 ENCOUNTER — Other Ambulatory Visit: Payer: Self-pay | Admitting: Internal Medicine

## 2018-05-08 DIAGNOSIS — R351 Nocturia: Principal | ICD-10-CM

## 2018-05-08 DIAGNOSIS — N401 Enlarged prostate with lower urinary tract symptoms: Secondary | ICD-10-CM

## 2018-06-07 ENCOUNTER — Encounter: Payer: Self-pay | Admitting: Family

## 2018-06-07 ENCOUNTER — Ambulatory Visit (INDEPENDENT_AMBULATORY_CARE_PROVIDER_SITE_OTHER)
Admission: RE | Admit: 2018-06-07 | Discharge: 2018-06-07 | Disposition: A | Discharge status: Home / Self Care | Payer: BLUE CROSS/BLUE SHIELD | Source: Ambulatory Visit | Attending: Family | Admitting: Family

## 2018-06-07 ENCOUNTER — Ambulatory Visit: Payer: BLUE CROSS/BLUE SHIELD | Admitting: Family

## 2018-06-07 VITALS — BP 106/78 | HR 67 | Temp 97.8°F | Ht 67.0 in | Wt 134.0 lb

## 2018-06-07 DIAGNOSIS — R059 Cough, unspecified: Secondary | ICD-10-CM

## 2018-06-07 DIAGNOSIS — J309 Allergic rhinitis, unspecified: Secondary | ICD-10-CM | POA: Diagnosis not present

## 2018-06-07 DIAGNOSIS — R05 Cough: Secondary | ICD-10-CM

## 2018-06-07 MED ORDER — DOXYCYCLINE HYCLATE 100 MG PO TABS: 100.0000 mg | ORAL_TABLET | Freq: Two times a day (BID) | ORAL | 0 refills | Status: DC

## 2018-06-07 MED ORDER — PREDNISONE 20 MG PO TABS: 20.0000 mg | ORAL_TABLET | Freq: Every day | ORAL | 0 refills | Status: DC

## 2018-06-07 NOTE — Progress Notes (Signed)
Wayne SchmidtJohn Silva is a 58 y.o. male with the following history as recorded in Epic[facility]:  Patient Active Problem List   Diagnosis Date Noted  . RTI (respiratory tract infection) 03/10/2018  . Abrasion of penis 12/03/2017  . BPH associated with nocturia 10/29/2017  . MCI (mild cognitive impairment) 05/01/2014  . Cough variant asthma 05/01/2014  . Routine general medical examination at a health care facility 08/26/2013  . IC (interstitial cystitis) 08/26/2013  . GAD (generalized anxiety disorder) 03/02/2013    Current Outpatient Medications  Medication Sig Dispense Refill  . albuterol (VENTOLIN HFA) 108 (90 Base) MCG/ACT inhaler Inhale 1-2 puffs into the lungs every 6 (six) hours as needed for wheezing or shortness of breath. 18 g 0  . alfuzosin (UROXATRAL) 10 MG 24 hr tablet TAKE 1 TABLET (10 MG TOTAL) BY MOUTH DAILY WITH BREAKFAST. 90 tablet 1  . cetirizine (ZYRTEC) 10 MG tablet Take 10 mg by mouth daily.    Marland Kitchen. CIALIS 20 MG tablet TAKE 1 TABLET BY ORAL ROUTE EVERY DAY  10  . fluticasone furoate-vilanterol (BREO ELLIPTA) 200-25 MCG/INH AEPB Inhale 1 puff into the lungs daily. 28 each 6  . montelukast (SINGULAIR) 10 MG tablet TAKE 1 TABLET BY MOUTH AT BEDTIME 90 tablet 3  . Omega-3 Fatty Acids (FISH OIL PO) Take by mouth daily.    . valACYclovir (VALTREX) 1000 MG tablet Take 1 tablet by mouth daily.    Marland Kitchen. doxycycline (VIBRA-TABS) 100 MG tablet Take 1 tablet (100 mg total) by mouth 2 (two) times daily. 20 tablet 0  . predniSONE (DELTASONE) 20 MG tablet Take 1 tablet (20 mg total) by mouth daily with breakfast. 5 tablet 0   No current facility-administered medications for this visit.     Allergies: Patient has no known allergies.  Past Medical History:  Diagnosis Date  . Alcoholic (HCC)   . Anxiety   . Asthma   . Spastic bladder     Past Surgical History:  Procedure Laterality Date  . HEMORRHOID SURGERY    . KIDNEY STONE SURGERY      Family History  Problem Relation Age of Onset  .  Skin cancer Mother   . Pancreatic cancer Mother   . Colon cancer Father   . Alzheimer's disease Father   . Prostate cancer Paternal Grandfather   . Alzheimer's disease Maternal Grandmother     Social History   Tobacco Use  . Smoking status: Former Smoker    Last attempt to quit: 08/22/2004    Years since quitting: 13.8  . Smokeless tobacco: Never Used  . Tobacco comment: social smoker  Substance Use Topics  . 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 Notes that did respond to combination of Doxycycline and Prednisone given at last OV but symptoms have re-flared in the past week.  Currently taking Zyrtec, Singulair and BREO; denies any chest pain or shortness of breath; occasional wheezing; no fever;     Objective:  Vitals:   06/07/18 1425  BP: 106/78  Pulse: 67  Temp: 97.8 F (36.6 C)  TempSrc: Oral  SpO2: 95%  Weight: 134 lb (60.8 kg)  Height: 5\' 7"  (1.702 m)  General: Well developed, well nourished, in no acute distress  Skin : Warm and dry.  Head: Normocephalic and atraumatic  Eyes: Sclera and conjunctiva clear; pupils round and reactive to light; extraocular movements intact  Ears: External normal; canals clear; tympanic membranes normal  Oropharynx: Pink, supple. No suspicious lesions  Neck: Supple without thyromegaly, adenopathy  Lungs: Respirations unlabored; clear to auscultation bilaterally without wheeze, rales, rhonchi  CVS exam: normal rate and regular rhythm.  Neurologic: Alert and oriented; speech intact; face symmetrical; moves all extremities well; CNII-XII intact without focal deficit   Assessment:  1. Allergic rhinitis,  unspecified seasonality, unspecified trigger   2. Cough     Plan:  Will treat for underlying infection again; patient in agreement to see allergist- referral updated/ ? Need for immunotherapy; will update CXR; Rx for Doxycycline and Prednisone since patient responded well to this treatment in the past; increase fluids, rest and follow-up to be determined.    No follow-ups on file.  Orders Placed This Encounter  Procedures  . DG Chest 2 View    Standing Status:   Future    Standing Expiration Date:   08/06/2019    Order Specific Question:   Reason for Exam (SYMPTOM  OR DIAGNOSIS REQUIRED)    Answer:   cough    Order Specific Question:   Preferred imaging location?    Answer:   [facility]    Order Specific Question:   Radiology Contrast Protocol - do NOT remove file path    Answer:   [email or web address]  . Ambulatory referral to Allergy    Referral Priority:   Routine    Referral Type:   Allergy Testing    Referral Reason:   Specialty Services Required    Requested Specialty:   Allergy    Number of Visits Requested:   1    Requested Prescriptions   Signed Prescriptions Disp Refills  . doxycycline (VIBRA-TABS) 100 MG tablet 20 tablet 0    Sig: Take 1 tablet (100 mg total) by mouth 2 (two) times daily.  . predniSONE (DELTASONE) 20 MG tablet 5 tablet 0    Sig: Take 1 tablet (20 mg total) by mouth daily with breakfast.

## 2018-06-21 ENCOUNTER — Encounter: Payer: Self-pay | Admitting: Pediatrics

## 2018-06-21 ENCOUNTER — Ambulatory Visit (INDEPENDENT_AMBULATORY_CARE_PROVIDER_SITE_OTHER): Payer: BLUE CROSS/BLUE SHIELD | Admitting: Pediatrics

## 2018-06-21 VITALS — BP 102/64 | HR 77 | Temp 98.2°F | Resp 16 | Ht 67.0 in | Wt 130.3 lb

## 2018-06-21 DIAGNOSIS — J301 Allergic rhinitis due to pollen: Secondary | ICD-10-CM | POA: Diagnosis not present

## 2018-06-21 DIAGNOSIS — J4540 Moderate persistent asthma, uncomplicated: Secondary | ICD-10-CM

## 2018-06-21 DIAGNOSIS — K219 Gastro-esophageal reflux disease without esophagitis: Secondary | ICD-10-CM | POA: Diagnosis not present

## 2018-06-21 MED ORDER — FLUTICASONE PROPIONATE 50 MCG/ACT NA SUSP: NASAL | 5 refills | Status: DC

## 2018-06-21 NOTE — Patient Instructions (Addendum)
Environmental control of dust  and mold. Zyrtec 10 mg-take 1 tablet once a day for runny nose for itchy eyes Fluticasone 2 sprays per nostril at night after doing nasal saline irrigation Opcon-A-1 drop 3 times a day if needed for itchy eyes Montelukast 10 mg-take 1 tablet once a day at night to prevent coughing or wheezing Breo Ellipta 200 - 1 puff every 24 hours to prevent coughing or wheezing Pro-air 2 puffs every 4 hours if needed for wheezing or coughing spells.  You may use Pro-air 2 puffs 5 to 15 minutes before exercise Continue on your other medications Call us if you are not doing well on this treatment plan Hydrocortisone 1% cream twice a day if needed to red itchy areas , if you have an itchy rash that last more than 1 day A HEPA filter in your bedroom would help with any cat allergy issues Wipe the surfaces in your shower with 5 to 10% Clorox.  Wear gloves and ventilate the area by opening the windows there when you are going to clean it

## 2018-06-21 NOTE — Progress Notes (Signed)
[address] [place] [address] Dept: [phone]  New Patient Note  Patient ID: Wayne Silva, male    DOB: 1961/04/07  Age: 58 y.o. MRN: [id number] Date of Office Visit: 06/21/2018 Referring provider: Olive Bass, FNP [address] [place], [place] [address]    Chief Complaint: Allergic Rhinitis ; Asthma; and Wheezing  HPI Wayne Silva presents for an allergy evaluation.  He has had symptoms of allergic rhinitis for several years but during the past 4 months his symptoms have been worse.  He has required prednisone and antibiotics twice in the past 4 months for respiratory tract infections.  He has never had pneumonia or other severe infections.  He has had asthma for 2 years.  If  he gets overheated , sometimes he has an itchy rash which can last a week.  He has never had eczema.  Occasionally he does have heartburn and takes over-the-counter medications .  His asthma is controlled with Breo 200 Ellipta- 1 puff every 24 hours and montelukast 10 mg once a day  Review of Systems  Constitutional: Negative.   HENT:       Sinus problems for several years.  Worse in the past 4 months  Eyes: Negative.   Respiratory:       Asthma for 2 years  Cardiovascular: Negative.   Gastrointestinal:       Occasional heartburn .  Hemorrhoidectomy  Genitourinary:       Kidney stone.  Interstitial cystitis  Musculoskeletal: Negative.   Skin:       Itchy rash at times when he gets overheated  Neurological: Negative.   Endo/Heme/Allergies:       No diabetes or thyroid disease  Psychiatric/Behavioral: Negative.     Outpatient Encounter Medications as of 06/21/2018  Medication Sig  . albuterol (VENTOLIN HFA) 108 (90 Base) MCG/ACT inhaler Inhale 1-2 puffs into the lungs every 6 (six) hours as needed for wheezing or shortness of breath.  . alfuzosin (UROXATRAL) 10 MG 24 hr tablet TAKE 1 TABLET (10 MG TOTAL) BY MOUTH DAILY WITH BREAKFAST.  Marland Kitchen cetirizine (ZYRTEC) 10 MG tablet Take 10 mg  by mouth daily.  Marland Kitchen CIALIS 20 MG tablet TAKE 1 TABLET BY ORAL ROUTE EVERY DAY  . fluticasone furoate-vilanterol (BREO ELLIPTA) 200-25 MCG/INH AEPB Inhale 1 puff into the lungs daily.  . montelukast (SINGULAIR) 10 MG tablet TAKE 1 TABLET BY MOUTH AT BEDTIME  . Omega-3 Fatty Acids (FISH OIL PO) Take by mouth daily.  . valACYclovir (VALTREX) 1000 MG tablet Take 1 tablet by mouth daily.  . fluticasone (FLONASE) 50 MCG/ACT nasal spray Two sprays each nostril at night for nasal congestion or drainage.  . [DISCONTINUED] doxycycline (VIBRA-TABS) 100 MG tablet Take 1 tablet (100 mg total) by mouth 2 (two) times daily.  . [DISCONTINUED] predniSONE (DELTASONE) 20 MG tablet Take 1 tablet (20 mg total) by mouth daily with breakfast.   No facility-administered encounter medications on file as of 06/21/2018.      Drug Allergies:  No Known Allergies  Family History: Wayne Silva's family history includes Alzheimer's disease in his father and maternal grandmother; Colon cancer in his father; Pancreatic cancer in his mother; Prostate cancer in his paternal grandfather; Skin cancer in his mother..  Family history is negative for asthma, hayfever, sinus problems, eczema, hives, food allergies recurrent infections, chronic bronchitis or emphysema.  Social and environmental.  They have a cat in the home.  He is not exposed to cigarette smoking.  He smoked socially in  the past  Physical Exam: BP 102/64 (BP Location: Left Arm, Patient Position: Sitting, Cuff Size: Normal)   Pulse 77   Temp 98.2 F (36.8 C) (Oral)   Resp 16   Ht 5\' 7"  (1.702 m)   Wt 130 lb 4.7 oz (59.1 kg)   SpO2 96%   BMI 20.41 kg/m    Physical Exam Vitals signs reviewed.  Constitutional:      Appearance: Normal appearance. He is normal weight.  HENT:     Head:     Comments: Eyes normal  Ears normal.  Nose mild swelling of the nasal turbinates.  Pharynx normal. Neck:     Musculoskeletal: Neck supple.     Comments: No  thyromegaly Cardiovascular:     Comments: S1-S2 normal no murmurs Pulmonary:     Comments: Clear to percussion and auscultation Abdominal:     Palpations: Abdomen is soft.     Tenderness: There is no abdominal tenderness.     Comments: No hepatosplenomegaly  Lymphadenopathy:     Cervical: No cervical adenopathy.  Skin:    Comments: Clear  Neurological:     General: No focal deficit present.     Mental Status: He is oriented to person, place, and time.  Psychiatric:        Mood and Affect: Mood normal.        Behavior: Behavior normal.        Thought Content: Thought content normal.        Judgment: Judgment normal.     Diagnostics: FVC 3.74 L FEV1 3.19 L.  Addicted FVC 4.39 L predicted FEV1 3.36 L after albuterol 2 puffs FVC 3.91 L FEV1 3.26 L the spirometry is in the normal range and there was no significant improvement after albuterol  Allergy skin test were positive to grass pollens, weed pollen, tree pollens, molds   Assessment  Assessment and Plan: 1. Moderate persistent asthma without complication   2. Seasonal allergic rhinitis due to pollen   3. Gastroesophageal reflux disease without esophagitis     Meds ordered this encounter  Medications  . fluticasone (FLONASE) 50 MCG/ACT nasal spray    Sig: Two sprays each nostril at night for nasal congestion or drainage.    Dispense:  16 g    Refill:  5    Patient Instructions  Environmental control of dust  and mold. Zyrtec 10 mg-take 1 tablet once a day for runny nose for itchy eyes Fluticasone 2 sprays per nostril at night after doing nasal saline irrigation Opcon-A-1 drop 3 times a day if needed for itchy eyes Montelukast 10 mg-take 1 tablet once a day at night to prevent coughing or wheezing Breo Ellipta 200 - 1 puff every 24 hours to prevent coughing or wheezing Pro-air 2 puffs every 4 hours if needed for wheezing or coughing spells.  You may use Pro-air 2 puffs 5 to 15 minutes before exercise Continue on your  other medications Call us if you are not doing well on this treatment plan Hydrocortisone 1% cream twice a day if needed to red itchy areas , if you have an itchy rash that last more than 1 day A HEPA filter in your bedroom would help with any cat allergy issues Wipe the surfaces in your shower with 5 to 10% Clorox.  Wear gloves and ventilate the area by opening the windows there when you are going to clean it   Return in about 4 weeks (around 07/19/2018).   Thank you for the  opportunity to care for this patient.  Please do not hesitate to contact me with questions.  Tonette Bihari, M.D.  [facility] [address] [place], [place] [address] [phone]

## 2018-06-25 ENCOUNTER — Other Ambulatory Visit: Payer: Self-pay | Admitting: Family

## 2018-06-25 MED ORDER — FLUTICASONE FUROATE-VILANTEROL 200-25 MCG/INH IN AEPB: 1.0000 | INHALATION_SPRAY | Freq: Every day | RESPIRATORY_TRACT | 6 refills | Status: DC

## 2018-07-19 ENCOUNTER — Ambulatory Visit (INDEPENDENT_AMBULATORY_CARE_PROVIDER_SITE_OTHER): Payer: BLUE CROSS/BLUE SHIELD | Admitting: Pediatrics

## 2018-07-19 ENCOUNTER — Encounter: Payer: Self-pay | Admitting: Pediatrics

## 2018-07-19 VITALS — BP 100/60 | HR 60 | Temp 98.3°F | Resp 16 | Ht 67.0 in | Wt 130.1 lb

## 2018-07-19 DIAGNOSIS — J4540 Moderate persistent asthma, uncomplicated: Secondary | ICD-10-CM

## 2018-07-19 DIAGNOSIS — J301 Allergic rhinitis due to pollen: Secondary | ICD-10-CM

## 2018-07-19 NOTE — Patient Instructions (Addendum)
Zyrtec 10 mg-take 1 tablet at night for runny nose or itchy eyes Nasal saline irrigations once a day followed by fluticasone 1 spray per nostril twice a day OpconA-1 drop 3 times a day if needed for itchy eyes Montelukast 10 mg-take 1 tablet at night to prevent coughing or wheezing Breo Ellipta 200-1 puff every 24 hours to prevent coughing or wheezing Pro-air 2 puffs every 4 hours if needed for wheezing or coughing spells.  You may use Pro-air 2 puffs 5 to 15 minutes before exercise If your allergic symptoms get worse, add prednisone 10 mg twice a day for 4 days, 10 mg in the fifth day Continue your other medications Call us if you are not doing well on this treatment plan Allergy injection information given to the patient.  He will let us know if he wants to start allergy injections

## 2018-07-19 NOTE — Progress Notes (Signed)
[address] [place] [address] Dept: [phone]  FOLLOW UP NOTE  Patient ID: Wayne Silva, male    DOB: [birth year]  Age: 58 y.o. MRN: [id number] Date of Office Visit: 07/19/2018  Assessment  Chief Complaint: Allergic Rhinitis ; Asthma; and Nasal Congestion  HPI [name] presents for follow-up of asthma and allergic rhinitis.  His asthma is well controlled with the use of Breo Ellipta 200-1 puff every 24 hours and montelukast 10 mg once a day.  He does have mild nasal congestion despite the use of nasal saline irrigations in the morning followed by fluticasone 2 sprays per nostril once a day.  He has a cat in the home.  He is on cetirizine 10 mg once a day   Drug Allergies:  No Known Allergies  Physical Exam: BP 100/60 (BP Location: Right Arm, Patient Position: Sitting, Cuff Size: Normal)   Pulse 60   Temp 98.3 F (36.8 C) (Oral)   Resp 16   Ht 5\' 7"  (1.702 m)   Wt 130 lb 1.1 oz (59 kg)   SpO2 99%   BMI 20.37 kg/m    Physical Exam Constitutional:      Appearance: Normal appearance. He is normal weight.  HENT:     Head:     Comments: Eyes normal.  Ears normal.  Nose normal.  Pharynx normal. Neck:     Musculoskeletal: Neck supple.  Cardiovascular:     Comments: S1-S2 normal no murmurs Pulmonary:     Comments: Clear to percussion and auscultation Lymphadenopathy:     Cervical: No cervical adenopathy.  Neurological:     General: No focal deficit present.     Mental Status: He is alert and oriented to person, place, and time.  Psychiatric:        Mood and Affect: Mood normal.        Behavior: Behavior normal.        Thought Content: Thought content normal.        Judgment: Judgment normal.     Diagnostics: FVC 3.98 L FEV1 3.20 L.  Predicted FVC 4.39 L predicted FEV1 3.36 L-the spirometry is in the normal range  Assessment and Plan: 1. Moderate persistent asthma without complication   2. Seasonal allergic rhinitis due to pollen     No orders of  the defined types were placed in this encounter.   Patient Instructions  Zyrtec 10 mg-take 1 tablet at night for runny nose or itchy eyes Nasal saline irrigations once a day followed by fluticasone 1 spray per nostril twice a day OpconA-1 drop 3 times a day if needed for itchy eyes Montelukast 10 mg-take 1 tablet at night to prevent coughing or wheezing Breo Ellipta 200-1 puff every 24 hours to prevent coughing or wheezing Pro-air 2 puffs every 4 hours if needed for wheezing or coughing spells.  You may use Pro-air 2 puffs 5 to 15 minutes before exercise If your allergic symptoms get worse, add prednisone 10 mg twice a day for 4 days, 10 mg in the fifth day Continue your other medications Call us if you are not doing well on this treatment plan Allergy injection information given to the patient.  He will let us know if he wants to start allergy injections   Return in about 3 months (around 10/19/2018).    Thank you for the opportunity to care for this patient.  Please do not hesitate to contact me with questions.  Tonette Bihari, M.D.  [facility] [address] [place], [place] [address] [phone]

## 2018-07-23 ENCOUNTER — Telehealth: Payer: Self-pay | Admitting: Internal Medicine

## 2018-07-23 NOTE — Telephone Encounter (Signed)
Patient needs to have an appointment with PCP.

## 2018-07-23 NOTE — Telephone Encounter (Signed)
Copied from CRM [id number]. Topic: General - Other >> Jul 23, 2018  8:45 AM Burchel, Abbi R wrote: Pt requesting STD screening.  Please call pt to advise.   [phone]

## 2018-07-26 NOTE — Telephone Encounter (Signed)
Patient scheduled.

## 2018-07-29 ENCOUNTER — Ambulatory Visit: Payer: BLUE CROSS/BLUE SHIELD | Admitting: Internal Medicine

## 2018-08-04 ENCOUNTER — Ambulatory Visit: Payer: BLUE CROSS/BLUE SHIELD | Admitting: Internal Medicine

## 2018-09-30 ENCOUNTER — Ambulatory Visit: Payer: BLUE CROSS/BLUE SHIELD | Admitting: Internal Medicine

## 2018-09-30 ENCOUNTER — Other Ambulatory Visit: Payer: Self-pay

## 2018-09-30 ENCOUNTER — Ambulatory Visit (INDEPENDENT_AMBULATORY_CARE_PROVIDER_SITE_OTHER): Payer: BLUE CROSS/BLUE SHIELD | Admitting: Internal Medicine

## 2018-09-30 ENCOUNTER — Encounter: Payer: Self-pay | Admitting: Internal Medicine

## 2018-09-30 ENCOUNTER — Other Ambulatory Visit (INDEPENDENT_AMBULATORY_CARE_PROVIDER_SITE_OTHER): Payer: BLUE CROSS/BLUE SHIELD

## 2018-09-30 VITALS — BP 120/72 | HR 80 | Temp 98.0°F | Resp 16 | Ht 67.0 in | Wt 123.5 lb

## 2018-09-30 DIAGNOSIS — N410 Acute prostatitis: Secondary | ICD-10-CM | POA: Insufficient documentation

## 2018-09-30 DIAGNOSIS — R369 Urethral discharge, unspecified: Secondary | ICD-10-CM

## 2018-09-30 DIAGNOSIS — Z7251 High risk heterosexual behavior: Secondary | ICD-10-CM | POA: Insufficient documentation

## 2018-09-30 DIAGNOSIS — R360 Urethral discharge without blood: Secondary | ICD-10-CM | POA: Insufficient documentation

## 2018-09-30 LAB — URINALYSIS, ROUTINE W REFLEX MICROSCOPIC
Bilirubin Urine: NEGATIVE
Hgb urine dipstick: NEGATIVE
Ketones, ur: 15 — AB
Leukocytes,Ua: NEGATIVE
Nitrite: NEGATIVE
Specific Gravity, Urine: 1.025 (ref 1.000–1.030)
Total Protein, Urine: NEGATIVE
Urine Glucose: NEGATIVE
Urobilinogen, UA: 0.2 (ref 0.0–1.0)
pH: 6 (ref 5.0–8.0)

## 2018-09-30 MED ORDER — LEVOFLOXACIN 500 MG PO TABS: 500.0000 mg | ORAL_TABLET | Freq: Every day | ORAL | 0 refills | Status: AC

## 2018-09-30 NOTE — Progress Notes (Signed)
Subjective:  Patient ID: Wayne Silva, male    DOB: 1960-05-18  Age: 58 y.o. MRN: [id number]  CC: Penile Discharge   HPI Chaze Hruska presents for concerns about a one-month history of penile discharge that he describes as clear and sticky.  The discharge is not painful or bloody.  He has had unprotected sex with 2 women in the last 6 months.  Outpatient Medications Prior to Visit  Medication Sig Dispense Refill  . albuterol (VENTOLIN HFA) 108 (90 Base) MCG/ACT inhaler Inhale 1-2 puffs into the lungs every 6 (six) hours as needed for wheezing or shortness of breath. 18 g 0  . alfuzosin (UROXATRAL) 10 MG 24 hr tablet TAKE 1 TABLET (10 MG TOTAL) BY MOUTH DAILY WITH BREAKFAST. 90 tablet 1  . cetirizine (ZYRTEC) 10 MG tablet Take 10 mg by mouth daily.    Marland Kitchen CIALIS 20 MG tablet TAKE 1 TABLET BY ORAL ROUTE EVERY DAY  10  . fluticasone (FLONASE) 50 MCG/ACT nasal spray Two sprays each nostril at night for nasal congestion or drainage. (Patient taking differently: Two sprays each nostril in morning for nasal congestion or drainage.) 16 g 5  . fluticasone furoate-vilanterol (BREO ELLIPTA) 200-25 MCG/INH AEPB Inhale 1 puff into the lungs daily. 28 each 6  . montelukast (SINGULAIR) 10 MG tablet TAKE 1 TABLET BY MOUTH AT BEDTIME 90 tablet 3  . Omega-3 Fatty Acids (FISH OIL PO) Take by mouth daily.    . valACYclovir (VALTREX) 1000 MG tablet Take 1 tablet by mouth daily.     No facility-administered medications prior to visit.     ROS Review of Systems  Constitutional: Negative for chills and fever.  HENT: Negative.  Negative for sore throat and trouble swallowing.   Eyes: Negative for visual disturbance.  Respiratory: Negative for cough, chest tightness, shortness of breath and wheezing.   Cardiovascular: Negative for palpitations and leg swelling.  Gastrointestinal: Negative for abdominal pain and diarrhea.  Genitourinary: Positive for discharge. Negative for difficulty urinating, dysuria, flank  pain, genital sores, hematuria, penile pain, scrotal swelling, testicular pain and urgency.  Musculoskeletal: Negative.  Negative for arthralgias.  Skin: Negative.  Negative for rash.  Allergic/Immunologic: Negative.   Neurological: Negative.   Hematological: Negative for adenopathy. Does not bruise/bleed easily.  Psychiatric/Behavioral: Negative.     Objective:  BP 120/72 (BP Location: Left Arm, Patient Position: Sitting, Cuff Size: Normal)   Pulse 80   Temp 98 F (36.7 C) (Oral)   Resp 16   Ht  (1.702 m)   Wt 123 lb 8 oz (56 kg)   SpO2 97%   BMI 19.34 kg/m   BP Readings from Last 3 Encounters:  09/30/18 120/72  07/19/18 100/60  06/21/18 102/64    Wt Readings from Last 3 Encounters:  09/30/18 123 lb 8 oz (56 kg)  07/19/18 130 lb 1.1 oz (59 kg)  06/21/18 130 lb 4.7 oz (59.1 kg)    Physical Exam Constitutional:      Appearance: He is not ill-appearing or diaphoretic.  HENT:     Nose: Nose normal.     Mouth/Throat:     Mouth: Mucous membranes are moist.     Pharynx: No oropharyngeal exudate or posterior oropharyngeal erythema.  Eyes:     General: No scleral icterus.    Conjunctiva/sclera: Conjunctivae normal.  Neck:     Musculoskeletal: Normal range of motion. No neck rigidity or muscular tenderness.  Cardiovascular:     Rate and Rhythm: Normal rate and regular  rhythm.     Heart sounds: No murmur. No gallop.   Pulmonary:     Effort: Pulmonary effort is normal. No respiratory distress.     Breath sounds: No stridor. No wheezing, rhonchi or rales.  Abdominal:     General: Abdomen is flat.     Palpations: There is no hepatomegaly, splenomegaly or mass.     Tenderness: There is no abdominal tenderness. There is no guarding.     Hernia: There is no hernia in the right inguinal area or left inguinal area.  Genitourinary:    Pubic Area: No rash.      Penis: Normal. No erythema, discharge, swelling or lesions.      Scrotum/Testes: Normal.        Right: Mass,  tenderness or swelling not present.        Left: Mass, tenderness or swelling not present.     Epididymis:     Right: Normal.     Left: Normal.     Prostate: Enlarged (2+ smooth symm ) and tender (boggy and tender). No nodules present.     Rectum: Normal. Guaiac result negative. No mass, tenderness, anal fissure, external hemorrhoid or internal hemorrhoid. Normal anal tone.  Musculoskeletal: Normal range of motion.  Lymphadenopathy:     Cervical: No cervical adenopathy.     Lower Body: No right inguinal adenopathy. No left inguinal adenopathy.  Skin:    General: Skin is warm.     Findings: No rash.  Neurological:     General: No focal deficit present.     Mental Status: He is alert.  Psychiatric:        Mood and Affect: Mood normal.        Behavior: Behavior normal.     Lab Results  Component Value Date   WBC 6.4 02/12/2018   HGB 15.4 02/12/2018   HCT 45.1 02/12/2018   PLT 183.0 02/12/2018   GLUCOSE 112 (H) 02/12/2018   CHOL 187 10/29/2017   TRIG 126.0 10/29/2017   HDL 64.70 10/29/2017   LDLCALC 97 10/29/2017   ALT 11 02/12/2018   AST 13 02/12/2018   NA 142 02/12/2018   K 4.1 02/12/2018   CL 107 02/12/2018   CREATININE 0.98 02/12/2018   BUN 31 (H) 02/12/2018   CO2 30 02/12/2018   TSH 1.19 02/12/2018   PSA 1.2 07/25/2017    Dg Chest 2 View  Result Date: 06/08/2018 CLINICAL DATA:  Cough and wheezing for 3 days.  Former smoker. EXAM: CHEST - 2 VIEW COMPARISON:  05/01/2014 FINDINGS: The heart size and mediastinal contours are within normal limits. Both lungs are clear. The visualized skeletal structures are unremarkable. IMPRESSION: No active cardiopulmonary disease. Electronically Signed   By: Myles Rosenthal M.D.   On: 06/08/2018 08:25    Assessment & Plan:   Keavin was seen today for penile discharge.  Diagnoses and all orders for this visit:  Abnormal penile discharge, without blood- He has a painless discharge and a few white cells in his urine.  I will screen him  for gonorrhea, chlamydia, and trichomonas.  His symptoms are most consistent with bacterial prostatitis so I have asked him to take levofloxacin daily for 1 month. -     Urinalysis, Routine w reflex microscopic; Future -     CULTURE, URINE COMPREHENSIVE; Future -     C. trachomatis/N. gonorrhoeae RNA; Future -     Chlamydia/Gonococcus/Trichomonas, NAA; Future  Acute bacterial prostatitis -     levofloxacin (LEVAQUIN) 500  MG tablet; Take 1 tablet (500 mg total) by mouth daily. -     Chlamydia/Gonococcus/Trichomonas, NAA; Future  High risk heterosexual behavior -     Chlamydia/Gonococcus/Trichomonas, NAA; Future -     HIV Antibody (routine testing w rflx); Future -     RPR; Future   I am having Drucilla SchmidtJohn Eblen start on levofloxacin. I am also having him maintain his valACYclovir, Omega-3 Fatty Acids (FISH OIL PO), Cialis, albuterol, montelukast, alfuzosin, cetirizine, fluticasone, and fluticasone furoate-vilanterol.  Meds ordered this encounter  Medications  . levofloxacin (LEVAQUIN) 500 MG tablet    Sig: Take 1 tablet (500 mg total) by mouth daily.    Dispense:  30 tablet    Refill:  0     Follow-up: Return in about 6 weeks (around 11/11/2018).  Sanda Lingerhomas Demetrice Amstutz, MD

## 2018-09-30 NOTE — Patient Instructions (Signed)
Prostatitis    Prostatitis is swelling or inflammation of the prostate gland. The prostate is a walnut-sized gland that is involved in the production of semen. It is located below a man's bladder, in front of the rectum.  There are four types of prostatitis:  · Chronic nonbacterial prostatitis. This is the most common type of prostatitis. It may be associated with a viral infection or autoimmune disorder.  · Acute bacterial prostatitis. This is the least common type of prostatitis. It starts quickly and is usually associated with a bladder infection, high fever, and shaking chills. It can occur at any age.  · Chronic bacterial prostatitis. This type usually results from acute bacterial prostatitis that happens repeatedly (is recurrent) or has not been treated properly. It can occur in men of any age but is most common among middle-aged men whose prostate has begun to get larger. The symptoms are not as severe as symptoms caused by acute bacterial prostatitis.  · Prostatodynia or chronic pelvic pain syndrome (CPPS). This type is also called pelvic floor disorder. It is associated with increased muscular tone in the pelvis surrounding the prostate.  What are the causes?  Bacterial prostatitis is caused by infection from bacteria. Chronic nonbacterial prostatitis may be caused by:  · Urinary tract infections (UTIs).  · Nerve damage.  · A response by the body’s disease-fighting system (autoimmune response).  · Chemicals in the urine.  The causes of the other types of prostatitis are usually not known.  What are the signs or symptoms?  Symptoms of this condition vary depending upon the type of prostatitis. If you have acute bacterial prostatitis, you may experience:  · Urinary symptoms, such as:  ? Painful urination.  ? Burning during urination.  ? Frequent and sudden urges to urinate.  ? Inability to start urinating.  ? A weak or interrupted stream of urine.  · Vomiting.  · Nausea.  · Fever.  · Chills.  · Inability to  empty the bladder completely.  · Pain in the:  ? Muscles or joints.  ? Lower back.  ? Lower abdomen.  If you have any of the other types of prostatitis, you may experience:  · Urinary symptoms, such as:  ? Sudden urges to urinate.  ? Frequent urination.  ? Difficulty starting urination.  ? Weak urine stream.  ? Dribbling after urination.  · Discharge from the urethra. The urethra is a tube that opens at the end of the penis.  · Pain in the:  ? Testicles.  ? Penis or tip of the penis.  ? Rectum.  ? Area in front of the rectum and below the scrotum (perineum).  · Problems with sexual function.  · Painful ejaculation.  · Bloody semen.  How is this diagnosed?  This condition may be diagnosed based on:  · A physical and medical exam.  · Your symptoms.  · A urine test to check for bacteria.  · An exam in which a health care provider uses a finger to feel the prostate (digital rectal exam).  · A test of a sample of semen.  · Blood tests.  · Ultrasound.  · Removal of prostate tissue to be examined under a microscope (biopsy).  · Tests to check how your body handles urine (urodynamic tests).  · A test to look inside your bladder or urethra (cystoscopy).  How is this treated?  Treatment for this condition depends on the type of prostatitis. Treatment may involve:  · Medicines to relieve   pain or inflammation.  · Medicines to help relax your muscles.  · Physical therapy.  · Heat therapy.  · Techniques to help you control certain body functions (biofeedback).  · Relaxation exercises.  · Antibiotic medicine, if your condition is caused by bacteria.  · Warm water baths (sitz baths). Sitz baths help with relaxing your pelvic floor muscles, which helps to relieve pressure on the prostate.  Follow these instructions at home:    · Take over-the-counter and prescription medicines only as told by your health care provider.  · If you were prescribed an antibiotic, take it as told by your health care provider. Do not stop taking the  antibiotic even if you start to feel better.  · If physical therapy, biofeedback, or relaxation exercises were prescribed, do exercises as instructed.  · Take sitz baths as directed by your health care provider. For a sitz bath, sit in warm water that is deep enough to cover your hips and buttocks.  · Keep all follow-up visits as told by your health care provider. This is important.  Contact a health care provider if:  · Your symptoms get worse.  · You have a fever.  Get help right away if:  · You have chills.  · You feel nauseous.  · You vomit.  · You feel light-headed or feel like you are going to faint.  · You are unable to urinate.  · You have blood or blood clots in your urine.  This information is not intended to replace advice given to you by your health care provider. Make sure you discuss any questions you have with your health care provider.  Document Released: 04/25/2000 Document Revised: 07/11/2017 Document Reviewed: 01/17/2016  Elsevier Interactive Patient Education © 2019 [employer]

## 2018-10-01 ENCOUNTER — Encounter: Payer: Self-pay | Admitting: Internal Medicine

## 2018-10-01 LAB — HIV ANTIBODY (ROUTINE TESTING W REFLEX): HIV 1&2 Ab, 4th Generation: NONREACTIVE

## 2018-10-01 LAB — RPR: RPR Ser Ql: NONREACTIVE

## 2018-10-02 LAB — CULTURE, URINE COMPREHENSIVE
MICRO NUMBER:: 496063
RESULT:: NO GROWTH
SPECIMEN QUALITY:: ADEQUATE

## 2018-10-04 LAB — CHLAMYDIA/GONOCOCCUS/TRICHOMONAS, NAA
Chlamydia by NAA: NEGATIVE
Gonococcus by NAA: NEGATIVE
Trich vag by NAA: NEGATIVE

## 2018-10-05 ENCOUNTER — Encounter: Payer: Self-pay | Admitting: Internal Medicine

## 2018-10-07 ENCOUNTER — Telehealth: Payer: Self-pay

## 2018-10-07 DIAGNOSIS — N3289 Other specified disorders of bladder: Secondary | ICD-10-CM

## 2018-10-07 DIAGNOSIS — N138 Other obstructive and reflux uropathy: Secondary | ICD-10-CM | POA: Diagnosis not present

## 2018-10-07 DIAGNOSIS — R358 Other polyuria: Secondary | ICD-10-CM | POA: Diagnosis not present

## 2018-10-07 DIAGNOSIS — N3010 Interstitial cystitis (chronic) without hematuria: Secondary | ICD-10-CM | POA: Diagnosis not present

## 2018-10-07 DIAGNOSIS — N401 Enlarged prostate with lower urinary tract symptoms: Secondary | ICD-10-CM | POA: Diagnosis not present

## 2018-10-07 NOTE — Telephone Encounter (Signed)
Doing a blood test for herpes is not helpful and it is very expensive. If he has any painful blisters or other concerns about herpes then he should let me know. I have seen no concern for herpes thus far.  Wayne Silva

## 2018-10-07 NOTE — Telephone Encounter (Signed)
Pt contacted and informed of same. Pt stated understanding and was agreeable.

## 2018-10-07 NOTE — Telephone Encounter (Signed)
Copied from CRM [id number]. Topic: General - Inquiry >> Oct 07, 2018 11:03 AM Crist Infante wrote: Reason for CRM: 1. Pt would like to know why the dr did not order herpes test with the other test?  Pt would like to switch urologist.  He goes to [facility], Dr Gerome Sam .would like to know who Dr Yetta Barre prefers and get anew referral.

## 2018-10-07 NOTE — Telephone Encounter (Signed)
Pt contacted and he has 2 questions:   1. Is there a reason that a herpes test was not done?   2. Okay to put in an order for a new urologist, pt would like to someone other than Dr. Jeralyn Bennett.

## 2018-10-19 ENCOUNTER — Ambulatory Visit: Payer: BLUE CROSS/BLUE SHIELD | Admitting: Pediatrics

## 2018-10-25 ENCOUNTER — Ambulatory Visit: Payer: BC Managed Care – PPO | Admitting: Pediatrics

## 2018-11-08 ENCOUNTER — Ambulatory Visit: Payer: Self-pay | Admitting: Pediatrics

## 2018-11-11 ENCOUNTER — Ambulatory Visit (INDEPENDENT_AMBULATORY_CARE_PROVIDER_SITE_OTHER): Payer: BC Managed Care – PPO | Admitting: Internal Medicine

## 2018-11-11 ENCOUNTER — Encounter: Payer: Self-pay | Admitting: Internal Medicine

## 2018-11-11 ENCOUNTER — Other Ambulatory Visit (INDEPENDENT_AMBULATORY_CARE_PROVIDER_SITE_OTHER): Payer: BC Managed Care – PPO

## 2018-11-11 ENCOUNTER — Other Ambulatory Visit: Payer: Self-pay

## 2018-11-11 VITALS — BP 124/64 | HR 65 | Temp 98.5°F | Resp 16 | Ht 67.0 in | Wt 132.0 lb

## 2018-11-11 DIAGNOSIS — R739 Hyperglycemia, unspecified: Secondary | ICD-10-CM

## 2018-11-11 DIAGNOSIS — R8281 Pyuria: Secondary | ICD-10-CM | POA: Diagnosis not present

## 2018-11-11 DIAGNOSIS — R360 Urethral discharge without blood: Secondary | ICD-10-CM

## 2018-11-11 DIAGNOSIS — N410 Acute prostatitis: Secondary | ICD-10-CM

## 2018-11-11 DIAGNOSIS — Z0000 Encounter for general adult medical examination without abnormal findings: Secondary | ICD-10-CM

## 2018-11-11 DIAGNOSIS — M25562 Pain in left knee: Secondary | ICD-10-CM

## 2018-11-11 DIAGNOSIS — G8929 Other chronic pain: Secondary | ICD-10-CM

## 2018-11-11 LAB — URINALYSIS, ROUTINE W REFLEX MICROSCOPIC
Bilirubin Urine: NEGATIVE
Hgb urine dipstick: NEGATIVE
Ketones, ur: NEGATIVE
Leukocytes,Ua: NEGATIVE
Nitrite: NEGATIVE
RBC / HPF: NONE SEEN (ref 0–?)
Specific Gravity, Urine: >=1.03 — AB (ref 1.000–1.030)
Total Protein, Urine: NEGATIVE
Urine Glucose: NEGATIVE
Urobilinogen, UA: 0.2 (ref 0.0–1.0)
WBC, UA: NONE SEEN (ref 0–?)
pH: 6 (ref 5.0–8.0)

## 2018-11-11 LAB — LIPID PANEL
Cholesterol: 160 mg/dL (ref 0–200)
HDL: 79 mg/dL (ref >39.00)
LDL Cholesterol: 71 mg/dL (ref 0–99)
NonHDL: 80.95
Total CHOL/HDL Ratio: 2
Triglycerides: 51 mg/dL (ref 0.0–149.0)
VLDL: 10.2 mg/dL (ref 0.0–40.0)

## 2018-11-11 LAB — BASIC METABOLIC PANEL
BUN: 23 mg/dL (ref 6–23)
CO2: 29 mEq/L (ref 19–32)
Calcium: 9.2 mg/dL (ref 8.4–10.5)
Chloride: 104 mEq/L (ref 96–112)
Creatinine, Ser: 0.81 mg/dL (ref 0.40–1.50)
GFR: 98.12 mL/min (ref >60.00)
Glucose, Bld: 104 mg/dL — ABNORMAL HIGH (ref 70–99)
Potassium: 4.5 mEq/L (ref 3.5–5.1)
Sodium: 141 mEq/L (ref 135–145)

## 2018-11-11 LAB — PSA: PSA: 1.25 ng/mL (ref 0.10–4.00)

## 2018-11-11 LAB — HEMOGLOBIN A1C: Hgb A1c MFr Bld: 5.5 % (ref 4.6–6.5)

## 2018-11-11 NOTE — Progress Notes (Signed)
Subjective:  Patient ID: Wayne Silva, male    DOB: Jan 31, 1961  Age: 58 y.o. MRN: [id number]  CC: Annual Exam and Urinary Tract Infection   HPI Wayne Silva presents for a CPX.  He tells me that all of his urinary symptoms have resolved.  He does however want to undergo screening again for sexually transmitted infections.  He has had no sexual activity for at least 2 months.   Outpatient Medications Prior to Visit  Medication Sig Dispense Refill  . albuterol (VENTOLIN HFA) 108 (90 Base) MCG/ACT inhaler Inhale 1-2 puffs into the lungs every 6 (six) hours as needed for wheezing or shortness of breath. 18 g 0  . alfuzosin (UROXATRAL) 10 MG 24 hr tablet TAKE 1 TABLET (10 MG TOTAL) BY MOUTH DAILY WITH BREAKFAST. 90 tablet 1  . cetirizine (ZYRTEC) 10 MG tablet Take 10 mg by mouth daily.    Marland Kitchen. CIALIS 20 MG tablet TAKE 1 TABLET BY ORAL ROUTE EVERY DAY  10  . fluticasone (FLONASE) 50 MCG/ACT nasal spray Two sprays each nostril at night for nasal congestion or drainage. (Patient taking differently: Two sprays each nostril in morning for nasal congestion or drainage.) 16 g 5  . fluticasone furoate-vilanterol (BREO ELLIPTA) 200-25 MCG/INH AEPB Inhale 1 puff into the lungs daily. 28 each 6  . montelukast (SINGULAIR) 10 MG tablet TAKE 1 TABLET BY MOUTH AT BEDTIME 90 tablet 3  . Omega-3 Fatty Acids (FISH OIL PO) Take by mouth daily.    . valACYclovir (VALTREX) 1000 MG tablet Take 1 tablet by mouth daily.     No facility-administered medications prior to visit.     ROS Review of Systems  Constitutional: Positive for unexpected weight change (wt gain). Negative for chills and fever.  HENT: Negative.  Negative for sore throat.   Eyes: Negative.   Respiratory: Negative.  Negative for cough, chest tightness, shortness of breath and wheezing.   Cardiovascular: Negative for chest pain, palpitations and leg swelling.  Gastrointestinal: Negative for abdominal pain, diarrhea, nausea and vomiting.   Endocrine: Negative.   Genitourinary: Negative for difficulty urinating, discharge, dysuria, frequency, genital sores, hematuria, penile pain, penile swelling, scrotal swelling, testicular pain and urgency.  Musculoskeletal: Positive for arthralgias.       He complains of chronic intermittent pain in his left knee and he wants to see a specialist about it.  Skin: Negative.  Negative for color change, pallor and rash.  Neurological: Negative.  Negative for dizziness, weakness, light-headedness and headaches.  Hematological: Negative for adenopathy. Does not bruise/bleed easily.  Psychiatric/Behavioral: Negative for dysphoric mood, self-injury and suicidal ideas. The patient is nervous/anxious.     Objective:  BP 124/64 (BP Location: Left Arm, Patient Position: Sitting, Cuff Size: Normal)   Pulse 65   Temp 98.5 F (36.9 C) (Oral)   Resp 16   Ht 5\' 7"  (1.702 m)   Wt 132 lb (59.9 kg)   SpO2 97%   BMI 20.67 kg/m   BP Readings from Last 3 Encounters:  11/11/18 124/64  09/30/18 120/72  07/19/18 100/60    Wt Readings from Last 3 Encounters:  11/11/18 132 lb (59.9 kg)  09/30/18 123 lb 8 oz (56 kg)  07/19/18 130 lb 1.1 oz (59 kg)    Physical Exam Vitals signs reviewed.  Constitutional:      Appearance: He is not ill-appearing or diaphoretic.  HENT:     Nose: Nose normal.     Mouth/Throat:     Mouth: Mucous membranes are  moist.  Eyes:     General: No scleral icterus.    Conjunctiva/sclera: Conjunctivae normal.  Neck:     Musculoskeletal: Normal range of motion and neck supple. No neck rigidity or muscular tenderness.  Cardiovascular:     Rate and Rhythm: Normal rate and regular rhythm.     Heart sounds: No murmur.  Pulmonary:     Effort: Pulmonary effort is normal.     Breath sounds: No stridor. No wheezing, rhonchi or rales.  Abdominal:     General: Abdomen is flat. There is no distension.     Palpations: There is no hepatomegaly, splenomegaly or mass.     Tenderness:  There is no abdominal tenderness. There is no guarding.     Hernia: No hernia is present.  Genitourinary:    Comments: GU and rectal exams were deferred since they were just done 1 month ago. Musculoskeletal: Normal range of motion.     Left knee: Normal.     Right lower leg: No edema.     Left lower leg: No edema.  Lymphadenopathy:     Cervical: No cervical adenopathy.  Skin:    General: Skin is warm and dry.     Coloration: Skin is not pale.     Findings: No rash.  Neurological:     General: No focal deficit present.     Mental Status: Mental status is at baseline.  Psychiatric:        Mood and Affect: Mood normal.        Behavior: Behavior normal.     Lab Results  Component Value Date   WBC 6.4 02/12/2018   HGB 15.4 02/12/2018   HCT 45.1 02/12/2018   PLT 183.0 02/12/2018   GLUCOSE 104 (H) 11/11/2018   CHOL 160 11/11/2018   TRIG 51.0 11/11/2018   HDL 79.00 11/11/2018   LDLCALC 71 11/11/2018   ALT 11 02/12/2018   AST 13 02/12/2018   NA 141 11/11/2018   K 4.5 11/11/2018   CL 104 11/11/2018   CREATININE 0.81 11/11/2018   BUN 23 11/11/2018   CO2 29 11/11/2018   TSH 1.19 02/12/2018   PSA 1.25 11/11/2018   HGBA1C 5.5 11/11/2018    Dg Chest 2 View  Result Date: 06/08/2018 CLINICAL DATA:  Cough and wheezing for 3 days.  Former smoker. EXAM: CHEST - 2 VIEW COMPARISON:  05/01/2014 FINDINGS: The heart size and mediastinal contours are within normal limits. Both lungs are clear. The visualized skeletal structures are unremarkable. IMPRESSION: No active cardiopulmonary disease. Electronically Signed   By: Wayne RosenthalJohn  Silva M.D.   On: 06/08/2018 08:25    Assessment & Plan:   Wayne Silva was seen today for annual exam and urinary tract infection.  Diagnoses and all orders for this visit:  Acute bacterial prostatitis- His symptoms have resolved.  His UA is normal now.  Screening for gonorrhea, chlamydia, and trichomonas is negative.  This has been adequately treated. He was offered  reassurance. -     Chlamydia/Gonococcus/Trichomonas, NAA; Future -     Urinalysis, Routine w reflex microscopic; Future  Pyuria- See above. -     Chlamydia/Gonococcus/Trichomonas, NAA; Future -     Urinalysis, Routine w reflex microscopic; Future -     RPR; Future  Abnormal penile discharge, without blood  Routine general medical examination at a health care facility- Exam completed, labs reviewed, vaccines reviewed, colon cancer screening is up-to-date, patient education material was given. -     Lipid panel; Future -  PSA; Future -     HIV Antibody (routine testing w rflx); Future  Hyperglycemia -     Basic metabolic panel; Future -     Hemoglobin A1c; Future  Chronic pain of left knee -     Ambulatory referral to Sports Medicine   I am having [name] maintain his valACYclovir, Omega-3 Fatty Acids (FISH OIL PO), Cialis, albuterol, montelukast, alfuzosin, cetirizine, fluticasone, and fluticasone furoate-vilanterol.  No orders of the defined types were placed in this encounter.    Follow-up: Return if symptoms worsen or fail to improve.  Scarlette Calico, MD

## 2018-11-11 NOTE — Patient Instructions (Signed)

## 2018-11-14 LAB — CHLAMYDIA/GONOCOCCUS/TRICHOMONAS, NAA
Chlamydia by NAA: NEGATIVE
Gonococcus by NAA: NEGATIVE
Trich vag by NAA: NEGATIVE

## 2018-11-15 ENCOUNTER — Encounter: Payer: Self-pay | Admitting: Internal Medicine

## 2018-11-15 LAB — RPR: RPR Ser Ql: NONREACTIVE

## 2018-11-15 LAB — HIV ANTIBODY (ROUTINE TESTING W REFLEX): HIV 1&2 Ab, 4th Generation: NONREACTIVE

## 2018-11-18 ENCOUNTER — Ambulatory Visit (INDEPENDENT_AMBULATORY_CARE_PROVIDER_SITE_OTHER): Payer: BC Managed Care – PPO | Admitting: Internal Medicine

## 2018-11-18 ENCOUNTER — Telehealth: Payer: Self-pay | Admitting: *Deleted

## 2018-11-18 ENCOUNTER — Encounter: Payer: Self-pay | Admitting: Internal Medicine

## 2018-11-18 ENCOUNTER — Other Ambulatory Visit: Payer: Self-pay

## 2018-11-18 ENCOUNTER — Telehealth: Payer: Self-pay

## 2018-11-18 DIAGNOSIS — Z20822 Contact with and (suspected) exposure to covid-19: Secondary | ICD-10-CM

## 2018-11-18 DIAGNOSIS — B9789 Other viral agents as the cause of diseases classified elsewhere: Secondary | ICD-10-CM | POA: Diagnosis not present

## 2018-11-18 DIAGNOSIS — J069 Acute upper respiratory infection, unspecified: Secondary | ICD-10-CM | POA: Insufficient documentation

## 2018-11-18 NOTE — Progress Notes (Signed)
Virtual Visit via Video Note  I connected with Wayne Silva on 11/19/18 at 10:40 AM EDT by a video enabled telemedicine application and verified that I am speaking with the correct person using two identifiers.   I discussed the limitations of evaluation and management by telemedicine and the availability of in person appointments. The patient expressed understanding and agreed to proceed.  History of Present Illness: He checked in for a virtual visit.  He was not willing to come in for an in person visit due to the COVID-19 pandemic.  He complains of a 2-day history of scratchy throat, mild nonproductive cough, and congestion.  He has taken Zyrtec and feels better today than he did yesterday.  He denies shortness of breath, rash, lymphadenopathy, fevers, chills, abdominal pain, nausea, or vomiting.  He wants to be tested for COVID-19 infection.    Observations/Objective: Anxious male.  Calm, cooperative, appropriate.  Skin tone and appearance is normal.  He appears well-hydrated.  There is no evidence of respiratory distress.   Assessment and Plan: He has an acute viral upper respiratory infection.  He tells me his symptoms are not severe enough to warrant treatment with a medication.  He will undergo COVID-19 testing.   Follow Up Instructions: He will let me know if he develops any new or worsening symptoms.  If he wants something for symptom relief then he agrees to let me know.  We will notify him of his COVID-19 results.  In the meantime of asked him to wear a facemask, social distance, and if the COVID-19 test is positive to quarantine for 2 weeks.    I discussed the assessment and treatment plan with the patient. The patient was provided an opportunity to ask questions and all were answered. The patient agreed with the plan and demonstrated an understanding of the instructions.   The patient was advised to call back or seek an in-person evaluation if the symptoms worsen or if the condition  fails to improve as anticipated.  I provided 25 minutes of non-face-to-face time during this encounter.   Scarlette Calico, MD

## 2018-11-18 NOTE — Telephone Encounter (Signed)
Pt scheduled for testing at [facility] site on 11/19/18

## 2018-11-18 NOTE — Telephone Encounter (Signed)
PCP just completed a virtual visit with pt.   PCP is requesting pt to have a COVID test. Please contact when possible.

## 2018-11-18 NOTE — Telephone Encounter (Signed)
Wayne Silva, [name]      PCP just completed a virtual visit with pt.   PCP is requesting pt to have a COVID test. Please contact when possible.          Pt called and scheduled for testing at [facility] site on 11/19/18. Pt advised to wear a mask and remain in car at scheduled appt time. Understanding verbalized.

## 2018-11-19 ENCOUNTER — Encounter: Payer: Self-pay | Admitting: Internal Medicine

## 2018-11-19 ENCOUNTER — Other Ambulatory Visit: Payer: BC Managed Care – PPO

## 2018-11-19 DIAGNOSIS — Z20822 Contact with and (suspected) exposure to covid-19: Secondary | ICD-10-CM

## 2018-11-19 DIAGNOSIS — R6889 Other general symptoms and signs: Secondary | ICD-10-CM | POA: Diagnosis not present

## 2018-11-25 LAB — NOVEL CORONAVIRUS, NAA: SARS-CoV-2, NAA: NOT DETECTED

## 2018-11-26 ENCOUNTER — Telehealth: Payer: Self-pay

## 2018-11-26 NOTE — Telephone Encounter (Signed)
Copied from [place] [id number]. Topic: General - Other >> Nov 26, 2018  1:21 PM Yvette Rack wrote: Reason for CRM: Pt stated he had an appt with Dr. Ronnald Ramp last week and he still has a head cold and he would like a Rx to be sent his pharmacy.

## 2018-11-26 NOTE — Telephone Encounter (Signed)
Pt stated that he is still not feeling well. He is requesting an rx to pharmacy.

## 2018-11-29 NOTE — Telephone Encounter (Signed)
Contacted pt and he stated that he wants to wait a little bit - he is starting to have some symptoms relief.

## 2018-11-29 NOTE — Telephone Encounter (Signed)
Does he think he needs an antibiotic or just something for symptom relief?

## 2018-12-02 ENCOUNTER — Encounter: Payer: Self-pay | Admitting: Internal Medicine

## 2018-12-04 ENCOUNTER — Other Ambulatory Visit: Payer: Self-pay | Admitting: Pediatrics

## 2018-12-06 ENCOUNTER — Other Ambulatory Visit: Payer: Self-pay | Admitting: Internal Medicine

## 2018-12-06 ENCOUNTER — Ambulatory Visit: Payer: Self-pay | Admitting: Pediatrics

## 2018-12-06 ENCOUNTER — Encounter: Payer: Self-pay | Admitting: Internal Medicine

## 2018-12-06 DIAGNOSIS — N401 Enlarged prostate with lower urinary tract symptoms: Secondary | ICD-10-CM

## 2018-12-06 DIAGNOSIS — R351 Nocturia: Secondary | ICD-10-CM

## 2018-12-07 ENCOUNTER — Encounter: Payer: Self-pay | Admitting: Pediatrics

## 2018-12-07 ENCOUNTER — Ambulatory Visit (INDEPENDENT_AMBULATORY_CARE_PROVIDER_SITE_OTHER): Payer: BC Managed Care – PPO | Admitting: Pediatrics

## 2018-12-07 ENCOUNTER — Other Ambulatory Visit: Payer: Self-pay

## 2018-12-07 VITALS — BP 98/62 | HR 74 | Temp 98.3°F | Resp 16

## 2018-12-07 DIAGNOSIS — J301 Allergic rhinitis due to pollen: Secondary | ICD-10-CM

## 2018-12-07 DIAGNOSIS — J4540 Moderate persistent asthma, uncomplicated: Secondary | ICD-10-CM

## 2018-12-07 MED ORDER — BREO ELLIPTA 200-25 MCG/INH IN AEPB: 1.0000 | INHALATION_SPRAY | Freq: Every day | RESPIRATORY_TRACT | 3 refills | Status: DC

## 2018-12-07 MED ORDER — FLUTICASONE PROPIONATE 50 MCG/ACT NA SUSP: NASAL | 3 refills | Status: DC

## 2018-12-07 MED ORDER — MONTELUKAST SODIUM 10 MG PO TABS: 10.0000 mg | ORAL_TABLET | Freq: Every day | ORAL | 3 refills | Status: DC

## 2018-12-07 NOTE — Progress Notes (Signed)
[address] [place] [address] Dept: [phone]  FOLLOW UP NOTE  Patient ID: Wayne Silva, male    DOB: 1960/11/11  Age: 58 y.o. MRN: [id number] Date of Office Visit: 12/07/2018  Assessment  Chief Complaint: Asthma  HPI [name] presents for follow-up of asthma and allergic rhinitis.  He got over a cold last week and  did not have to use prednisone at all.  He is having some irritation in his lungs.  His asthma is well controlled with the use of Breo Ellipta 200- 1 puff every 24 hours and montelukast 10 mg once a day.  He did not have to use any prednisone this past spring time  He is on Zyrtec 10 mg taking 1 tablet at night and nasal saline irrigations once a day followed by fluticasone 1 spray per nostril twice a day   Drug Allergies:  No Known Allergies  Physical Exam: BP 98/62   Pulse 74   Temp 98.3 F (36.8 C) (Tympanic)   Resp 16   SpO2 95%    Physical Exam Vitals signs reviewed.  Constitutional:      Appearance: Normal appearance. He is normal weight.  HENT:     Head:     Comments: Eyes normal.  Ears normal.  Nose normal.  Pharynx normal. Neck:     Musculoskeletal: Neck supple.  Cardiovascular:     Comments: S1-S2 normal no murmurs Pulmonary:     Comments: Clear to percussion and auscultation Lymphadenopathy:     Cervical: No cervical adenopathy.  Neurological:     General: No focal deficit present.     Mental Status: He is alert and oriented to person, place, and time. Mental status is at baseline.  Psychiatric:        Mood and Affect: Mood normal.        Behavior: Behavior normal.        Thought Content: Thought content normal.        Judgment: Judgment normal.     Diagnostics: FVC 4.09 L FEV1 3.39 L.  Predicted FVC 4.36 L predicted FEV1 3.33 L.-  The spirometry is in the normal range  Assessment and Plan: 1. Moderate persistent asthma without complication   2. Seasonal allergic rhinitis due to pollen     Meds ordered this encounter   Medications  . fluticasone furoate-vilanterol (BREO ELLIPTA) 200-25 MCG/INH AEPB    Sig: Inhale 1 puff into the lungs daily.    Dispense:  84 each    Refill:  3    Please keep rx on file. Pt. Will call when needed. Please dispense 90 day supply.  . fluticasone (FLONASE) 50 MCG/ACT nasal spray    Sig: 1 spray per nostril twice a day if needed for stuffy nose    Dispense:  48 mL    Refill:  3    Please keep rx on file. Pt. Will call if needed. Dispense 90 day supply.  . montelukast (SINGULAIR) 10 MG tablet    Sig: Take 1 tablet (10 mg total) by mouth at bedtime.    Dispense:  90 tablet    Refill:  3    Please keep rx on file. Pt. Will call when needed. Please dispense 90 day supply.    Patient Instructions  Zyrtec 10 mg-take 1 tablet at night for runny nose or itchy eyes Nasal saline irrigations once a day followed by fluticasone 1 spray per nostril Opcon-A-1 drop 3 times a day if needed for itchy eyes  Montelukast 10 mg-take 1 tablet at night to prevent coughing or wheezing Breo Ellipta 200-1 puff every 24 hours to prevent coughing or wheezing Pro-air 2 puffs every 4 hours if needed for wheezing or coughing spells.  You may use Pro-air 2 puffs 5 to 15 minutes before exercise Add prednisone 10 mg twice a day for 4 days, 10 mg in the fifth day to help with the irritation in your lungs  Call us if you are not doing well on this treatment plan Continue on your other medications    Return in about 6 months (around 06/09/2019).    Thank you for the opportunity to care for this patient.  Please do not hesitate to contact me with questions.  Tonette BihariJ. A. Rakiya Krawczyk, M.D.  [facility] [address] [place], [place] [address] [phone]

## 2018-12-07 NOTE — Patient Instructions (Signed)
Zyrtec 10 mg-take 1 tablet at night for runny nose or itchy eyes Nasal saline irrigations once a day followed by fluticasone 1 spray per nostril Opcon-A-1 drop 3 times a day if needed for itchy eyes  Montelukast 10 mg-take 1 tablet at night to prevent coughing or wheezing Breo Ellipta 200-1 puff every 24 hours to prevent coughing or wheezing Pro-air 2 puffs every 4 hours if needed for wheezing or coughing spells.  You may use Pro-air 2 puffs 5 to 15 minutes before exercise Add prednisone 10 mg twice a day for 4 days, 10 mg in the fifth day to help with the irritation in your lungs  Call us if you are not doing well on this treatment plan Continue on your other medications

## 2019-02-08 DIAGNOSIS — N3010 Interstitial cystitis (chronic) without hematuria: Secondary | ICD-10-CM | POA: Diagnosis not present

## 2019-04-09 DIAGNOSIS — Z20828 Contact with and (suspected) exposure to other viral communicable diseases: Secondary | ICD-10-CM | POA: Diagnosis not present

## 2019-04-26 DIAGNOSIS — Z20828 Contact with and (suspected) exposure to other viral communicable diseases: Secondary | ICD-10-CM | POA: Diagnosis not present

## 2019-05-31 ENCOUNTER — Other Ambulatory Visit: Payer: Self-pay | Admitting: Internal Medicine

## 2019-05-31 DIAGNOSIS — N401 Enlarged prostate with lower urinary tract symptoms: Secondary | ICD-10-CM

## 2019-06-10 DIAGNOSIS — N3010 Interstitial cystitis (chronic) without hematuria: Secondary | ICD-10-CM | POA: Diagnosis not present

## 2019-06-10 DIAGNOSIS — E291 Testicular hypofunction: Secondary | ICD-10-CM | POA: Diagnosis not present

## 2019-06-10 DIAGNOSIS — R358 Other polyuria: Secondary | ICD-10-CM | POA: Diagnosis not present

## 2019-06-10 DIAGNOSIS — T387X5A Adverse effect of androgens and anabolic congeners, initial encounter: Secondary | ICD-10-CM | POA: Diagnosis not present

## 2019-06-10 DIAGNOSIS — N138 Other obstructive and reflux uropathy: Secondary | ICD-10-CM | POA: Diagnosis not present

## 2019-06-10 DIAGNOSIS — N401 Enlarged prostate with lower urinary tract symptoms: Secondary | ICD-10-CM | POA: Diagnosis not present

## 2019-06-13 ENCOUNTER — Ambulatory Visit: Payer: BC Managed Care – PPO | Admitting: Pediatrics

## 2019-06-28 ENCOUNTER — Telehealth: Payer: Self-pay | Admitting: Pediatrics

## 2019-06-28 DIAGNOSIS — J3489 Other specified disorders of nose and nasal sinuses: Secondary | ICD-10-CM

## 2019-06-28 NOTE — Telephone Encounter (Signed)
I recommend that he see an ENT specialist for a sinus endoscopy to determine if there are any structural changes in his nasal passages.  He may see Dr. Richardson Landry in [facility] or anyone at [facility]

## 2019-06-28 NOTE — Telephone Encounter (Signed)
PT has been having long term difficulty breathing through nose. PT wants to know if Dr Beaulah Dinning can treat this issue, or if he needs a referral to ENT.

## 2019-06-28 NOTE — Telephone Encounter (Signed)
Called and spoke with the patient and he states that he has been having difficulty breathing through his nose for years. He confirmed that he has been taking his Zyrtec daily and it does provide some relief but not enough to completely clear his nasal passages. He is using nasal saline irrigations followed by Fluticasone nasal spray 1 spray in each nostril. He also confirmed that he has been taking his Montelukast every day. Patient is wondering if there is something different we can recommend for treatment or if he needs a referral to ENT. Please advise.

## 2019-06-28 NOTE — Telephone Encounter (Signed)
Nasal obstruction would be the diagnosis

## 2019-06-28 NOTE — Telephone Encounter (Signed)
Yes sir I will get this referral taken care, what would you advise the diagnosis be so that I can send that with his referral? Thank You.

## 2019-07-04 ENCOUNTER — Telehealth: Payer: Self-pay | Admitting: *Deleted

## 2019-07-04 NOTE — Telephone Encounter (Signed)
Attempted to refer patient to [facility] and Richardson Landry in [facility] and both providers appeared not preferred by patient's insurance. Would you like for the patient to still be referred to one of these providers or would you like for the patient to be referred to a provider that is likely preferred such as Dr. Narda Bonds or Dr. Suszanne Conners? Please advise.

## 2019-07-04 NOTE — Telephone Encounter (Signed)
An ENT referral as been placed to Dr. Narda Bonds for Nasal Obstruction from Dr. Beaulah Dinning. Diagnosis is for a Nasal Obstruction. He would also like a sinus endoscopy to determine if there are any structural changes in his nasal passages.

## 2019-07-04 NOTE — Telephone Encounter (Signed)
Try Narda Bonds or Dr. Suszanne Conners in [place]

## 2019-07-04 NOTE — Telephone Encounter (Signed)
Order has been placed for the patient with Dr. Narda Bonds. Called and left a voicemail asking for patient to return call to advise of referral being placed.

## 2019-07-04 NOTE — Addendum Note (Signed)
Addended by: Dollene Cleveland R on: 07/04/2019 02:12 PM   Modules accepted: Orders

## 2019-07-05 NOTE — Telephone Encounter (Signed)
Thank you Ashleigh for handling this. Referral is in correctly for their office to get the patient scheduled.  Thanks

## 2019-07-05 NOTE — Telephone Encounter (Signed)
Called and left a detailed voicemail per DPR permission advising patient that a referral has been placed for Dr. Ezzard Standing and they will be in touch shortly to set up an appointment.

## 2019-07-11 ENCOUNTER — Telehealth: Payer: Self-pay | Admitting: Internal Medicine

## 2019-07-11 NOTE — Telephone Encounter (Signed)
New Message:   Pt states he is calling to see where he falls in the Phase Category of the getting the covid vaccination. Please advise.

## 2019-07-14 ENCOUNTER — Ambulatory Visit (INDEPENDENT_AMBULATORY_CARE_PROVIDER_SITE_OTHER): Payer: BC Managed Care – PPO | Admitting: Otolaryngology

## 2019-07-14 ENCOUNTER — Other Ambulatory Visit: Payer: Self-pay

## 2019-07-14 ENCOUNTER — Encounter (INDEPENDENT_AMBULATORY_CARE_PROVIDER_SITE_OTHER): Payer: Self-pay | Admitting: Otolaryngology

## 2019-07-14 VITALS — Temp 97.3°F

## 2019-07-14 DIAGNOSIS — J343 Hypertrophy of nasal turbinates: Secondary | ICD-10-CM | POA: Diagnosis not present

## 2019-07-14 DIAGNOSIS — J310 Chronic rhinitis: Secondary | ICD-10-CM | POA: Diagnosis not present

## 2019-07-14 NOTE — Progress Notes (Signed)
HPI: Wayne Silva is a 59 y.o. 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  He is always had trouble breathing through his nose.  He has history of allergies and is treated by Dr. Shaune Leeks.  He is presently using Flonase twice a day.  His nasal obstruction becomes worse at night.  He is also using antihistamines but complains of chronic trouble breathing through his nose.  Does not have symptoms of infection no pain pressure no yellow-green discharge mostly just nasal obstruction.  Past Medical History:  Diagnosis Date  . Alcoholic ([place])   . Anxiety   . Asthma   . Spastic bladder    Past Surgical History:  Procedure Laterality Date  . HEMORRHOID SURGERY    . KIDNEY STONE SURGERY     Social History   Socioeconomic History  . Marital status: Single    Spouse name: Not on file  . Number of children: Not on file  . Years of education: Not on file  . Highest education level: Not on file  Occupational History  . Not on file  Tobacco Use  . Smoking status: Former Smoker    Packs/day: 0.25    Years: 10.00    Pack years: 2.50    Types: Cigarettes    Quit date: 08/22/2004    Years since quitting: 14.9  . Smokeless tobacco: Never Used  . Tobacco comment: social smoker  Substance and Sexual Activity  . Alcohol use: No    Alcohol/week: 0.0 standard drinks    Comment: was an alcoholic (quit 10 years ago)  . Drug use: No  . Sexual activity: Yes    Partners: Female    Birth control/protection: Condom  Other Topics Concern  . Not on file  Social History Narrative  . Not on file   Social Determinants of Health   Financial Resource Strain:   . Difficulty of Paying Living Expenses: Not on file  Food Insecurity:   . Worried About Charity fundraiser in the Last Year: Not on file  . Ran Out of Food in the Last Year: Not on file  Transportation Needs:   . Lack of Transportation (Medical): Not on file  . Lack of Transportation (Non-Medical): Not on file  Physical  Activity:   . Days of Exercise per Week: Not on file  . Minutes of Exercise per Session: Not on file  Stress:   . Feeling of Stress : Not on file  Social Connections:   . Frequency of Communication with Friends and Family: Not on file  . Frequency of Social Gatherings with Friends and Family: Not on file  . Attends Religious Services: Not on file  . Active Member of Clubs or Organizations: Not on file  . Attends Archivist Meetings: Not on file  . Marital Status: Not on file   Family History  Problem Relation Age of Onset  . Skin cancer Mother   . Pancreatic cancer Mother   . Colon cancer Father   . Alzheimer's disease Father   . Prostate cancer Paternal Grandfather   . Alzheimer's disease Maternal Grandmother   . Allergic rhinitis Neg Hx   . Angioedema Neg Hx   . Asthma Neg Hx   . Eczema Neg Hx   . Urticaria Neg Hx   . Immunodeficiency Neg Hx    No Known Allergies Prior to Admission medications   Medication Sig Start Date End Date Taking? Authorizing Provider  albuterol (PROAIR HFA) 108 (90 Base) MCG/ACT  inhaler Inhale 2 puffs into the lungs every 6 (six) hours as needed for wheezing or shortness of breath.   Yes [provider]  alfuzosin (UROXATRAL) 10 MG 24 hr tablet TAKE 1 TABLET (10 MG TOTAL) BY MOUTH DAILY WITH BREAKFAST. 05/31/19  Yes [name], MD  cetirizine (ZYRTEC) 10 MG tablet Take 10 mg by mouth daily.   Yes [provider]  CIALIS 20 MG tablet TAKE 1 TABLET BY ORAL ROUTE EVERY DAY 05/25/15  Yes [provider]  fluticasone (FLONASE) 50 MCG/ACT nasal spray 1 spray per nostril twice a day if needed for stuffy nose 12/07/18  Yes Bardelas, Jose A, MD  fluticasone furoate-vilanterol (BREO ELLIPTA) 200-25 MCG/INH AEPB Inhale 1 puff into the lungs daily. 12/07/18  Yes Bardelas, Jose A, MD  montelukast (SINGULAIR) 10 MG tablet Take 1 tablet (10 mg total) by mouth at bedtime. 12/07/18  Yes Bardelas, Bonnita Hollow, MD  Omega-3 Fatty Acids  (FISH OIL PO) Take by mouth daily.   Yes [provider]  valACYclovir (VALTREX) 1000 MG tablet Take 1 tablet by mouth daily. 07/03/14  Yes [provider]     Positive ROS: Otherwise negative  All other systems have been reviewed and were otherwise negative with the exception of those mentioned in the HPI and as above.  Physical Exam: Constitutional: Alert, well-appearing, no acute distress Ears: External ears without lesions or tenderness. Ear canals are clear bilaterally with intact, clear TMs.  Nasal: External nose without lesions. Septum with mild deviation.  Moderate rhinitis with swollen inferior turbinates..  Nasal endoscopy was performed bilaterally.  Posterior nasal cavity was clear.  Septal spur on the left side.  Both middle meatus regions were clear with no signs of infection or drainage.  No polyps.  No nasopharyngeal abnormalities Oral: Lips and gums without lesions. Tongue and palate mucosa without lesions. Posterior oropharynx clear. Neck: No palpable adenopathy or masses Respiratory: Breathing comfortably  Skin: No facial/neck lesions or rash noted.  Procedures  Assessment: Chronic rhinitis. 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  Plan: Recommended regular use of Nasacort 2 sprays each nostril at night.  As well as azelastine 1 spray twice daily.  Continue with Zyrtec which seems to help him the most.  And use saline rinses during the day. Briefly discussed surgical options with him which would include septoplasty and turbinate reductions but would recommend medical treatment initially. He is otherwise healthy.  Narda Bonds, MD

## 2019-07-18 ENCOUNTER — Encounter: Payer: Self-pay | Admitting: Pediatrics

## 2019-07-18 ENCOUNTER — Other Ambulatory Visit: Payer: Self-pay

## 2019-07-18 ENCOUNTER — Ambulatory Visit (INDEPENDENT_AMBULATORY_CARE_PROVIDER_SITE_OTHER): Payer: BC Managed Care – PPO | Admitting: Pediatrics

## 2019-07-18 VITALS — BP 114/76 | HR 59 | Temp 97.9°F | Resp 16 | Ht 67.0 in | Wt 130.0 lb

## 2019-07-18 DIAGNOSIS — J301 Allergic rhinitis due to pollen: Secondary | ICD-10-CM

## 2019-07-18 DIAGNOSIS — J4540 Moderate persistent asthma, uncomplicated: Secondary | ICD-10-CM | POA: Diagnosis not present

## 2019-07-18 NOTE — Patient Instructions (Signed)
Azelastine 0.1% - 2 sprays per nostril twice a day for runny nose Nasacort 1 spray per nostril twice a day if needed for stuffy nose  Montelukast 10 mg-take 1 tablet once a day to prevent coughing or wheezing Breo Ellipta 200-1 puff every 24 hours to prevent coughing or wheezing ProAir 2 puffs every 4 hours if needed for wheezing or coughing spells.  You may use ProAir 2 puffs 5 to 15 minutes before exercise  Call us if you are not doing well on this treatment plan Continue on your other medications

## 2019-07-18 NOTE — Progress Notes (Signed)
  [address] [place] [address] Dept: [phone]  FOLLOW UP NOTE  Patient ID: Wayne Silva, male    DOB: 03-06-1961  Age: 59 y.o. MRN: [id number] Date of Office Visit: 07/18/2019  Assessment  Chief Complaint: Asthma  HPI Wayne Silva presents for follow-up of asthma and allergic rhinitis.  His asthma is well controlled by the use of Breo Ellipta 200-1 puff every 24 hours and montelukast 10 mg once a day  He saw Dr. Ezzard Standing for a ENT evaluation.  He found  narrow nasal passages.  He is doing better on azelastine 0.1% - 2 sprays per nostril twice a day and Nasacort 1 spray per nostril once a day   Drug Allergies:  Not on File  Physical Exam: BP 114/76   Pulse (!) 59   Temp 97.9 F (36.6 C) (Oral)   Resp 16   Ht 5\' 7"  (1.702 m)   Wt 130 lb (59 kg)   SpO2 100%   BMI 20.36 kg/m    Physical Exam Vitals reviewed.  Constitutional:      Appearance: Normal appearance. He is normal weight.  HENT:     Head:     Comments: Eyes normal.  Ears normal.  Nose normal.  Pharynx normal. Cardiovascular:     Comments: S1-S2 normal no murmurs Pulmonary:     Comments: Clear to percussion and auscultation Musculoskeletal:     Cervical back: Neck supple.  Lymphadenopathy:     Cervical: No cervical adenopathy.  Neurological:     General: No focal deficit present.     Mental Status: He is alert and oriented to person, place, and time. Mental status is at baseline.  Psychiatric:        Mood and Affect: Mood normal.        Behavior: Behavior normal.        Thought Content: Thought content normal.        Judgment: Judgment normal.     Diagnostics: FVC 3.69 L FEV1 3.20 L.  Predicted FVC 4.36 L predicted FEV1 3.33 L-the spirometry is in the normal range  Assessment and Plan: 1. Moderate persistent asthma without complication   2. Seasonal allergic rhinitis due to pollen     No orders of the defined types were placed in this encounter.   Patient Instructions  Azelastine  0.1% - 2 sprays per nostril twice a day for runny nose Nasacort 1 spray per nostril twice a day if needed for stuffy nose  Montelukast 10 mg-take 1 tablet once a day to prevent coughing or wheezing Breo Ellipta 200-1 puff every 24 hours to prevent coughing or wheezing ProAir 2 puffs every 4 hours if needed for wheezing or coughing spells.  You may use ProAir 2 puffs 5 to 15 minutes before exercise  Call if you are not doing well on this treatment plan Continue on your other medications   Return in about 6 months (around 01/18/2020).    Thank you for the opportunity to care for this patient.  Please do not hesitate to contact me with questions.  03/19/2020, M.D.  [facility] [address] [place], [place] [phone]

## 2019-08-18 IMAGING — DX DG CHEST 2V
2 series · 2 of 2 positions shown · non-contrast
Comparison: 05/01/2014

CLINICAL DATA: Cough and wheezing for 3 days.  Former smoker.

EXAM:
CHEST - 2 VIEW

[chest pa]
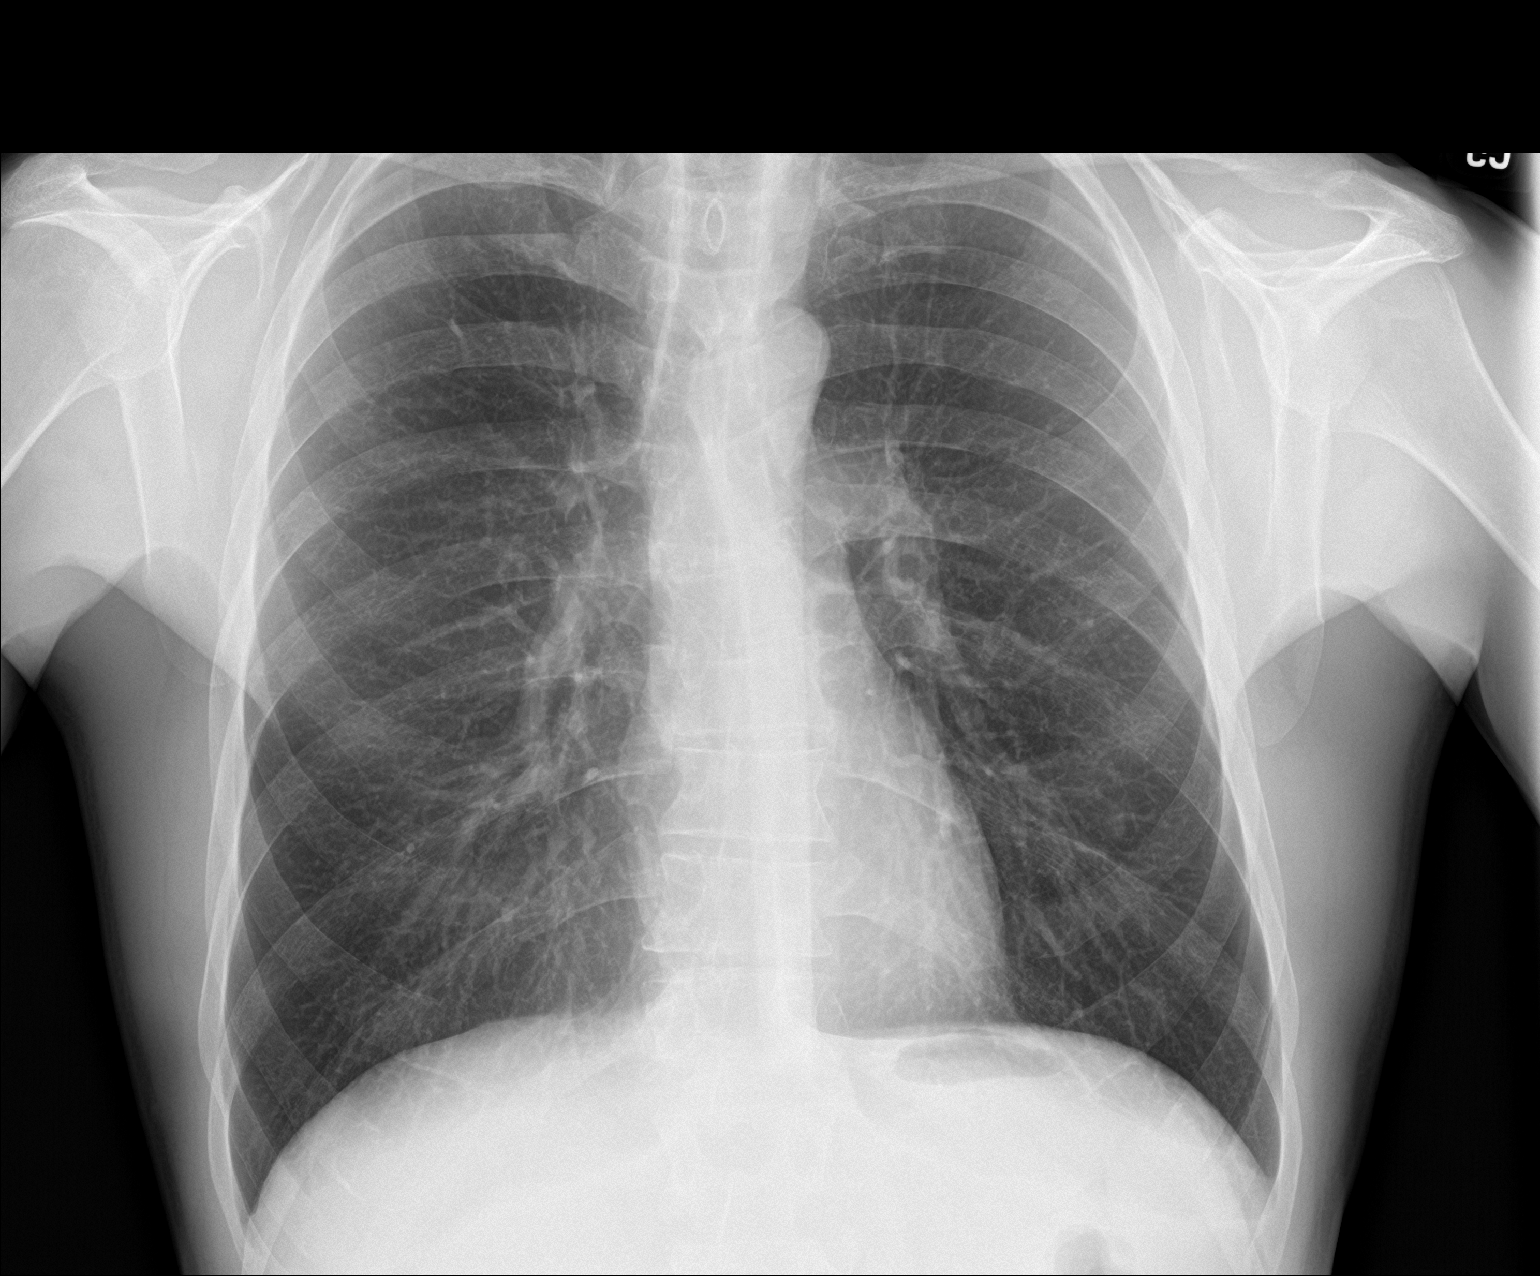

[chest lat]
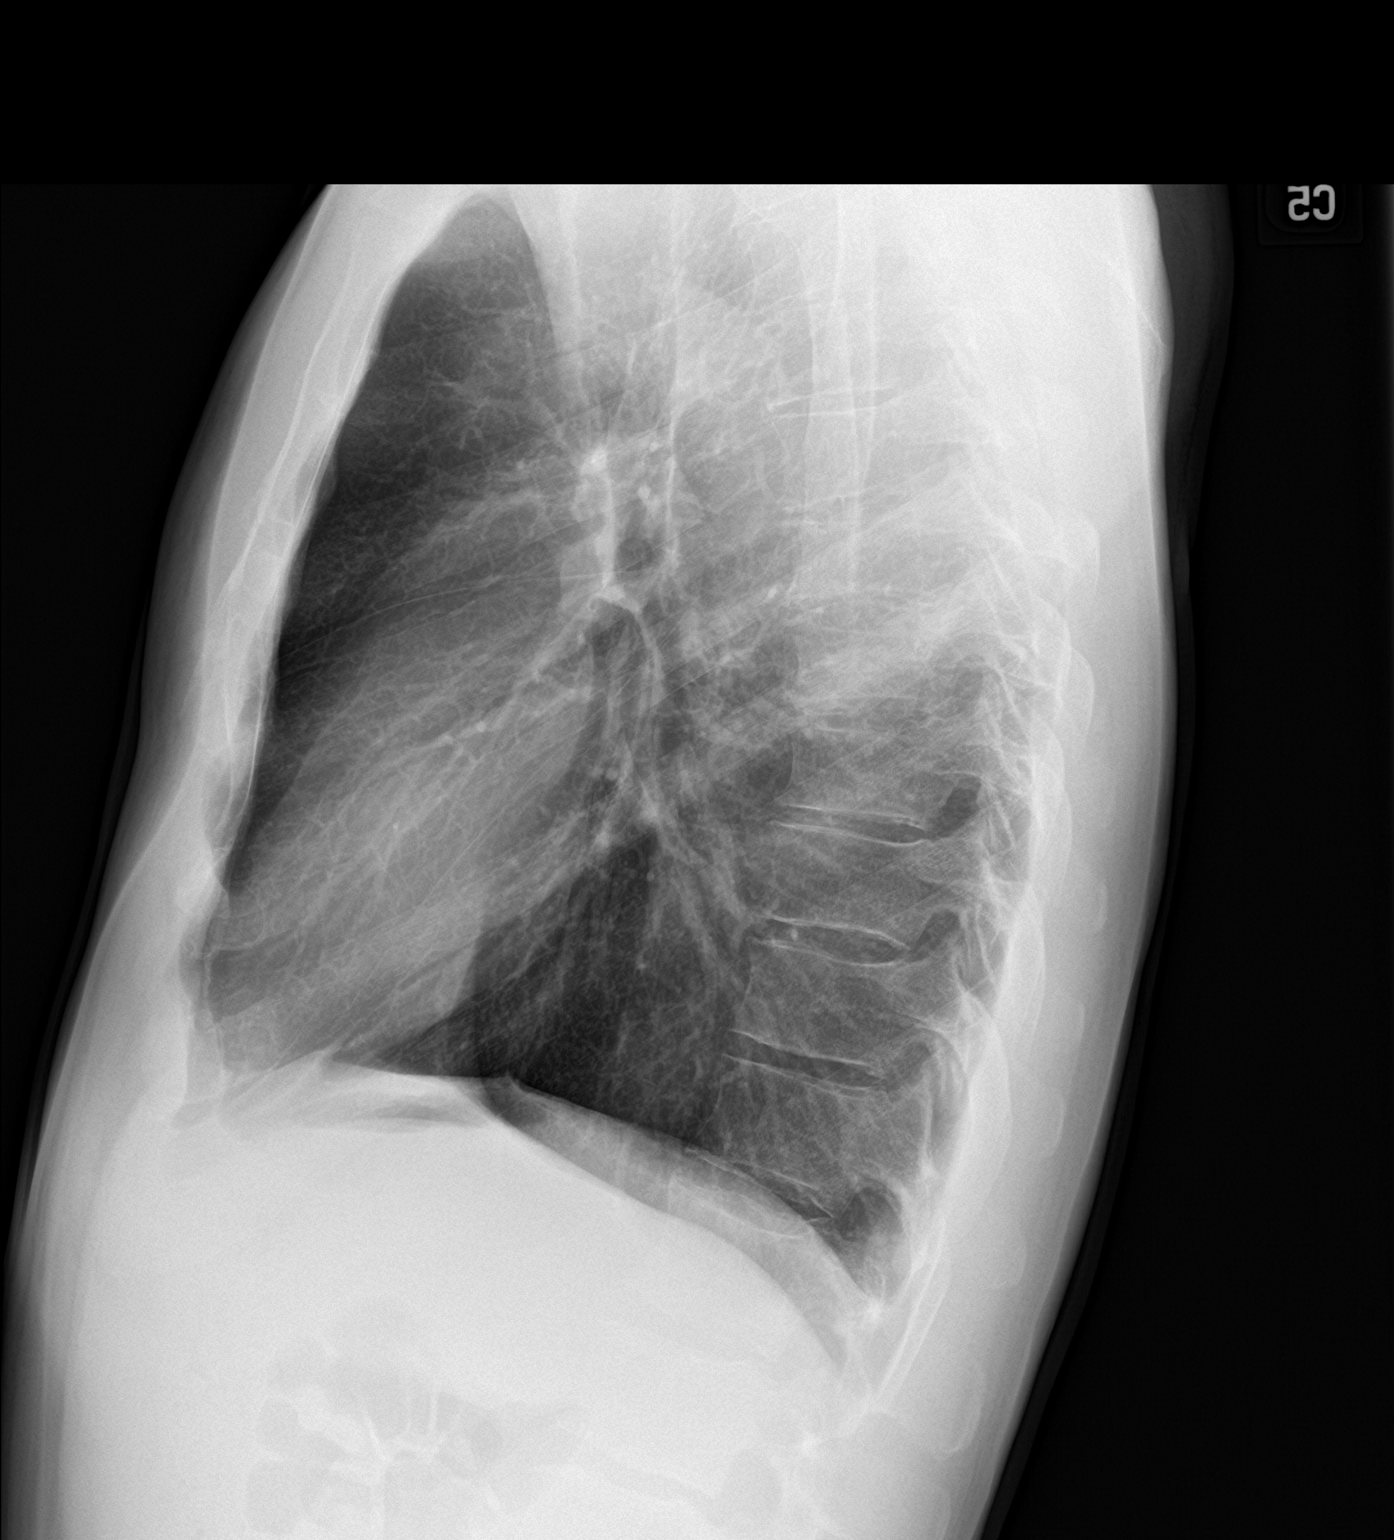

[2 of 2 positions shown; findings below may reference images not displayed]

FINDINGS: The heart size and mediastinal contours are within normal limits.
Both lungs are clear. The visualized skeletal structures are
unremarkable.
IMPRESSION: No active cardiopulmonary disease.

## 2019-09-09 DIAGNOSIS — N3010 Interstitial cystitis (chronic) without hematuria: Secondary | ICD-10-CM | POA: Diagnosis not present

## 2019-09-22 DIAGNOSIS — D229 Melanocytic nevi, unspecified: Secondary | ICD-10-CM | POA: Diagnosis not present

## 2019-09-22 DIAGNOSIS — L821 Other seborrheic keratosis: Secondary | ICD-10-CM | POA: Diagnosis not present

## 2019-09-22 DIAGNOSIS — L820 Inflamed seborrheic keratosis: Secondary | ICD-10-CM | POA: Diagnosis not present

## 2019-10-18 ENCOUNTER — Ambulatory Visit: Payer: BC Managed Care – PPO | Admitting: Allergy and Immunology

## 2019-10-20 ENCOUNTER — Telehealth: Payer: Self-pay | Admitting: Emergency Medicine

## 2019-10-20 ENCOUNTER — Encounter: Payer: Self-pay | Admitting: Internal Medicine

## 2019-10-20 ENCOUNTER — Ambulatory Visit (INDEPENDENT_AMBULATORY_CARE_PROVIDER_SITE_OTHER): Payer: BC Managed Care – PPO | Admitting: Internal Medicine

## 2019-10-20 ENCOUNTER — Other Ambulatory Visit: Payer: Self-pay

## 2019-10-20 VITALS — BP 118/76 | HR 68 | Temp 98.6°F | Ht 67.0 in | Wt 120.0 lb

## 2019-10-20 DIAGNOSIS — R634 Abnormal weight loss: Secondary | ICD-10-CM | POA: Diagnosis not present

## 2019-10-20 LAB — CBC
HCT: 44 % (ref 39.0–52.0)
Hemoglobin: 14.8 g/dL (ref 13.0–17.0)
MCHC: 33.5 g/dL (ref 30.0–36.0)
MCV: 93.1 fl (ref 78.0–100.0)
Platelets: 156 10*3/uL (ref 150.0–400.0)
RBC: 4.73 Mil/uL (ref 4.22–5.81)
RDW: 13.3 % (ref 11.5–15.5)
WBC: 7.9 10*3/uL (ref 4.0–10.5)

## 2019-10-20 LAB — COMPREHENSIVE METABOLIC PANEL
ALT: 31 U/L (ref 0–53)
AST: 23 U/L (ref 0–37)
Albumin: 4.3 g/dL (ref 3.5–5.2)
Alkaline Phosphatase: 59 U/L (ref 39–117)
BUN: 32 mg/dL — ABNORMAL HIGH (ref 6–23)
CO2: 31 mEq/L (ref 19–32)
Calcium: 9.6 mg/dL (ref 8.4–10.5)
Chloride: 99 mEq/L (ref 96–112)
Creatinine, Ser: 0.87 mg/dL (ref 0.40–1.50)
GFR: 90.06 mL/min (ref >60.00)
Glucose, Bld: 111 mg/dL — ABNORMAL HIGH (ref 70–99)
Potassium: 4 mEq/L (ref 3.5–5.1)
Sodium: 135 mEq/L (ref 135–145)
Total Bilirubin: 0.4 mg/dL (ref 0.2–1.2)
Total Protein: 6.9 g/dL (ref 6.0–8.3)

## 2019-10-20 LAB — PSA: PSA: 1.13 ng/mL (ref 0.10–4.00)

## 2019-10-20 LAB — VITAMIN B12: Vitamin B-12: 886 pg/mL (ref 211–911)

## 2019-10-20 LAB — VITAMIN D 25 HYDROXY (VIT D DEFICIENCY, FRACTURES): VITD: 54.04 ng/mL (ref 30.00–100.00)

## 2019-10-20 LAB — TSH: TSH: 2.11 u[IU]/mL (ref 0.35–4.50)

## 2019-10-20 NOTE — Telephone Encounter (Signed)
Pt stated he was seen by Dr Okey Dupre today. They discussed bug bites on the back of his head but Dr Okey Dupre didn't mention treatment for them. Can you please give him a call back with hoe to treat bites thanks.

## 2019-10-20 NOTE — Telephone Encounter (Signed)
I did see these while listening to lungs, can use hydrocortisone ointment on them twice a day if needed.

## 2019-10-20 NOTE — Telephone Encounter (Signed)
LVM for pt with details from Dr. Okey Dupre below.

## 2019-10-20 NOTE — Patient Instructions (Signed)
We are checking the labs today and will let you know about the results.   I would recommend to follow up with Dr. Yetta Barre in 1-2 weeks to go over results and check on weight.

## 2019-10-20 NOTE — Progress Notes (Signed)
Subjective:   Patient ID: Wayne Silva, male    DOB: March 19, 1961, 59 y.o.   MRN: [id number]  HPI The patient is a 59 YO man coming in for concerns about weight loss. Noticed since starting new androgenics program which includes comprehensive exercise, diet, supplement plan he has lost 10 pounds. About a year ago he was very stressed due to the pandemic and was not eating well. He did lose about 10 pounds at that time as well and did put that back on once he was able to manage his stress better. In the last 2 months he has been eating almost no carbs, certain amounts of meats and fats. This does not always work well with his stomach so at times he is eating more or less of things than he should. He has been working with his androgenics person and they are trying to boost his energy and hormones with all the supplements and he is concerned that they may have boosted his metabolism too much. In the last 3 days he has consulted with this person and they have increased his meals from 2 per day to 4 per day. He is unsure about an estimated calories per day he is eating. He does feel weak with the weight loss and not having more energy but feels his energy pathways are on the verge of opening up. Had colonoscopy in 2014 which per his reports was normal (report not available to review in epic).   Review of Systems  Constitutional: Positive for appetite change, fatigue and unexpected weight change.  HENT: Negative.   Eyes: Negative.   Respiratory: Negative for cough, chest tightness and shortness of breath.   Cardiovascular: Negative for chest pain, palpitations and leg swelling.  Gastrointestinal: Negative for abdominal distention, abdominal pain, constipation, diarrhea, nausea and vomiting.  Musculoskeletal: Negative.   Skin: Negative.   Neurological: Negative.   Psychiatric/Behavioral: Negative.     Objective:  Physical Exam Constitutional:      Appearance: He is well-developed.     Comments: thin    HENT:     Head: Normocephalic and atraumatic.  Cardiovascular:     Rate and Rhythm: Normal rate and regular rhythm.  Pulmonary:     Effort: Pulmonary effort is normal. No respiratory distress.     Breath sounds: Normal breath sounds. No wheezing or rales.  Abdominal:     General: Bowel sounds are normal. There is no distension.     Palpations: Abdomen is soft.     Tenderness: There is no abdominal tenderness. There is no rebound.  Musculoskeletal:     Cervical back: Normal range of motion.  Skin:    General: Skin is warm and dry.  Neurological:     Mental Status: He is alert and oriented to person, place, and time.     Coordination: Coordination normal.     Vitals:   10/20/19 1314  BP: 118/76  Pulse: 68  Temp: 98.6 F (37 C)  TempSrc: Oral  SpO2: 97%  Weight: 120 lb (54.4 kg)  Height: 5\' 7"  (1.702 m)    This visit occurred during the SARS-CoV-2 public health emergency.  Safety protocols were in place, including screening questions prior to the visit, additional usage of staff PPE, and extensive cleaning of exam room while observing appropriate contact time as indicated for disinfecting solutions.   Assessment & Plan:  Visit time 35 minutes in face to face communication with patient and coordination of care, additional 15 minutes spent in record  review, coordination or care, ordering tests, communicating/referring to other healthcare professionals, documenting in medical records all on the same day of the visit for total time 50 minutes spent on the visit.

## 2019-10-21 DIAGNOSIS — R634 Abnormal weight loss: Secondary | ICD-10-CM | POA: Insufficient documentation

## 2019-10-21 LAB — HEMOGLOBIN A1C: Hgb A1c MFr Bld: 5 % (ref 4.6–6.5)

## 2019-10-21 NOTE — Assessment & Plan Note (Signed)
Checking labs for metabolic causes including PSA (father with prostate cancer), CBC, CMP, TSH, vitamin D, vitamin B12. Adjust as needed. Advised to liberalize diet and follow up with PCP in 1-2 weeks. Colonoscopy per patient reports is up to date. No localizing symptoms to suggest need for imaging at this time. If unable to gain weight with diet changes could proceed with imaging chest and abdomen/pelvis.

## 2019-10-27 ENCOUNTER — Other Ambulatory Visit: Payer: Self-pay

## 2019-10-27 ENCOUNTER — Encounter: Payer: Self-pay | Admitting: Internal Medicine

## 2019-10-27 ENCOUNTER — Ambulatory Visit (INDEPENDENT_AMBULATORY_CARE_PROVIDER_SITE_OTHER): Payer: BC Managed Care – PPO | Admitting: Internal Medicine

## 2019-10-27 VITALS — BP 112/78 | HR 69 | Temp 98.4°F | Ht 67.0 in | Wt 123.0 lb

## 2019-10-27 DIAGNOSIS — F329 Major depressive disorder, single episode, unspecified: Secondary | ICD-10-CM

## 2019-10-27 DIAGNOSIS — R21 Rash and other nonspecific skin eruption: Secondary | ICD-10-CM | POA: Diagnosis not present

## 2019-10-27 DIAGNOSIS — Z91038 Other insect allergy status: Secondary | ICD-10-CM

## 2019-10-27 DIAGNOSIS — F450 Somatization disorder: Secondary | ICD-10-CM

## 2019-10-27 DIAGNOSIS — F32A Depression, unspecified: Secondary | ICD-10-CM | POA: Insufficient documentation

## 2019-10-27 LAB — SEDIMENTATION RATE: Sed Rate: 11 mm/hr (ref 0–20)

## 2019-10-27 MED ORDER — LEVOCETIRIZINE DIHYDROCHLORIDE 5 MG PO TABS: 5.0000 mg | ORAL_TABLET | Freq: Every evening | ORAL | 1 refills | Status: DC

## 2019-10-27 MED ORDER — MIRTAZAPINE 7.5 MG PO TABS: 7.5000 mg | ORAL_TABLET | Freq: Every day | ORAL | 0 refills | Status: DC

## 2019-10-27 MED ORDER — FLUOCINONIDE 0.05 % EX OINT: 1.0000 "application " | TOPICAL_OINTMENT | Freq: Two times a day (BID) | CUTANEOUS | 0 refills | Status: DC

## 2019-10-27 NOTE — Progress Notes (Signed)
Subjective:  Patient ID: Wayne Silva, male    DOB: [birth year]  Age: 59 y.o. MRN: [id number]  CC: Follow-up (Weight loss ) and Insect Bite (Bug bites on arms)  This visit occurred during the SARS-CoV-2 public health emergency.  Safety protocols were in place, including screening questions prior to the visit, additional usage of staff PPE, and extensive cleaning of exam room while observing appropriate contact time as indicated for disinfecting solutions.    HPI Wayne Silva presents for f/up -   1.  He complains of a 1 week history of itchy, red bumps on his extremities.  He has not gotten much symptom relief with Zyrtec.  He denies wheezing, shortness of breath, swelling around his mouth lips or tongue, or dizziness.  2.  He complains of feeling stressed.  He tells me the last year with COVID-19 has taken a toll on him.  He describes an obsessive regimen of controlling what he eats and working out.  He is losing weight.  He tells me that he does not tolerate any type of food as it causes a headache.  He denies diarrhea, abdominal pain, melena, or bright red blood per rectum.  Outpatient Medications Prior to Visit  Medication Sig Dispense Refill  . albuterol (PROAIR HFA) 108 (90 Base) MCG/ACT inhaler Inhale 2 puffs into the lungs every 6 (six) hours as needed for wheezing or shortness of breath.    . alfuzosin (UROXATRAL) 10 MG 24 hr tablet TAKE 1 TABLET (10 MG TOTAL) BY MOUTH DAILY WITH BREAKFAST. 90 tablet 1  . azelastine (ASTELIN) 0.1 % nasal spray Place 2 sprays into both nostrils 2 (two) times daily.    Marland Kitchen CIALIS 20 MG tablet TAKE 1 TABLET BY ORAL ROUTE EVERY DAY  10  . fluticasone furoate-vilanterol (BREO ELLIPTA) 200-25 MCG/INH AEPB Inhale 1 puff into the lungs daily. 84 each 3  . montelukast (SINGULAIR) 10 MG tablet Take 1 tablet (10 mg total) by mouth at bedtime. 90 tablet 3  . Omega-3 Fatty Acids (FISH OIL PO) Take by mouth daily.    Marland Kitchen triamcinolone (NASACORT ALLERGY 24HR) 55 MCG/ACT  AERO nasal inhaler Place 2 sprays into the nose daily.    . valACYclovir (VALTREX) 1000 MG tablet Take 1 tablet by mouth daily.    . cetirizine (ZYRTEC) 10 MG tablet Take 10 mg by mouth daily.     No facility-administered medications prior to visit.    ROS Review of Systems  Constitutional: Positive for unexpected weight change. Negative for chills, diaphoresis and fatigue.  HENT: Negative.   Eyes: Negative.   Respiratory: Negative for cough, chest tightness, shortness of breath and wheezing.   Cardiovascular: Negative for chest pain, palpitations and leg swelling.  Gastrointestinal: Negative for abdominal pain, diarrhea, nausea and vomiting.  Endocrine: Negative.   Genitourinary: Negative.  Negative for difficulty urinating.  Musculoskeletal: Negative.  Negative for back pain and myalgias.  Skin: Positive for rash. Negative for color change.  Neurological: Negative.  Negative for dizziness, weakness, light-headedness and headaches.  Hematological: Negative for adenopathy. Does not bruise/bleed easily.  Psychiatric/Behavioral: Positive for dysphoric mood. Negative for behavioral problems, decreased concentration, sleep disturbance and suicidal ideas. The patient is nervous/anxious. The patient is not hyperactive.     Objective:  BP 112/78 (BP Location: Left Arm, Patient Position: Sitting, Cuff Size: Normal)   Pulse 69   Temp 98.4 F (36.9 C) (Oral)   Ht 5' 7" (1.702 m)   Wt 123 lb (55.8 kg)   SpO2  97%   BMI 19.26 kg/m   BP Readings from Last 3 Encounters:  10/27/19 112/78  10/20/19 118/76  07/18/19 114/76    Wt Readings from Last 3 Encounters:  10/27/19 123 lb (55.8 kg)  10/20/19 120 lb (54.4 kg)  07/18/19 130 lb (59 kg)    Physical Exam Vitals reviewed.  Constitutional:      Appearance: Normal appearance.  HENT:     Nose: Nose normal.     Mouth/Throat:     Mouth: Mucous membranes are moist.  Eyes:     General: No scleral icterus.    Conjunctiva/sclera:  Conjunctivae normal.  Cardiovascular:     Rate and Rhythm: Normal rate and regular rhythm.     Heart sounds: No murmur heard.   Pulmonary:     Effort: Pulmonary effort is normal.     Breath sounds: No stridor. No wheezing, rhonchi or rales.  Abdominal:     General: Abdomen is flat.     Palpations: There is no mass.     Tenderness: There is no abdominal tenderness. There is no guarding.  Musculoskeletal:        General: Normal range of motion.     Cervical back: Neck supple.     Right lower leg: No edema.     Left lower leg: No edema.  Lymphadenopathy:     Cervical: No cervical adenopathy.  Skin:    Coloration: Skin is not pale.     Findings: Erythema and rash present. No abscess, ecchymosis or petechiae. Rash is macular and papular. Rash is not crusting, pustular, scaling or vesicular.     Comments: There are blanching, erythematous macules and papules on his extremities.  Neurological:     General: No focal deficit present.     Mental Status: He is alert and oriented to person, place, and time. Mental status is at baseline.  Psychiatric:        Attention and Perception: Attention normal.        Mood and Affect: Mood is anxious. Mood is not depressed. Affect is not labile, flat or tearful.        Speech: Speech is tangential. Speech is not delayed.        Behavior: Behavior is hyperactive. Behavior is not agitated, slowed or aggressive. Behavior is cooperative.        Thought Content: Thought content normal. Thought content is not paranoid or delusional. Thought content does not include homicidal or suicidal ideation.        Cognition and Memory: Cognition normal.        Judgment: Judgment normal.     Lab Results  Component Value Date   WBC 7.9 10/20/2019   HGB 14.8 10/20/2019   HCT 44.0 10/20/2019   PLT 156.0 10/20/2019   GLUCOSE 111 (H) 10/20/2019   CHOL 160 11/11/2018   TRIG 51.0 11/11/2018   HDL 79.00 11/11/2018   LDLCALC 71 11/11/2018   ALT 31 10/20/2019   AST 23  10/20/2019   NA 135 10/20/2019   K 4.0 10/20/2019   CL 99 10/20/2019   CREATININE 0.87 10/20/2019   BUN 32 (H) 10/20/2019   CO2 31 10/20/2019   TSH 2.11 10/20/2019   PSA 1.13 10/20/2019   HGBA1C 5.0 10/20/2019    DG Chest 2 View  Result Date: 06/08/2018 CLINICAL DATA:  Cough and wheezing for 3 days.  Former smoker. EXAM: CHEST - 2 VIEW SHE IS ACTIVE SO WEDNESDAY SHE IS SARAH I THINK THE FUNERAL WAS  YESTERDAY COMPARISON:  05/01/2014 FINDINGS: The heart size and mediastinal contours are within normal limits. Both lungs are clear. The visualized skeletal structures are unremarkable. IMPRESSION: No active cardiopulmonary disease. Electronically Signed   By: Earle Gell M.D.   On: 06/08/2018 08:25    Assessment & Plan:   Adonte was seen today for follow-up and insect bite.  Diagnoses and all orders for this visit:  Allergic reaction to insect bite -     Discontinue: levocetirizine (XYZAL) 5 MG tablet; Take 1 tablet (5 mg total) by mouth every evening. -     fluocinonide ointment (LIDEX) 0.05 %; Apply 1 application topically 2 (two) times daily. -     levocetirizine (XYZAL) 5 MG tablet; Take 1 tablet (5 mg total) by mouth every evening.  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 We may increase the dose over time. -     mirtazapine (REMERON) 7.5 MG tablet; Take 1 tablet (7.5 mg total) by mouth at bedtime.  Rash-  ESR is normal. Will screen for alpha-gal. -     Alpha-Gal Panel; Future -     Sedimentation rate; Future -     Sedimentation rate -     Alpha-Gal Panel   I have discontinued Ronaldo Giglia's cetirizine. I am also having him start on fluocinonide ointment and mirtazapine. Additionally, I am having him maintain his valACYclovir, Omega-3 Fatty Acids (FISH OIL PO), Cialis, albuterol, Breo Ellipta, montelukast, alfuzosin, azelastine, triamcinolone, and levocetirizine.  Meds ordered this encounter  Medications  . DISCONTD: levocetirizine (XYZAL) 5 MG tablet      Sig: Take 1 tablet (5 mg total) by mouth every evening.    Dispense:  90 tablet    Refill:  1  . fluocinonide ointment (LIDEX) 0.05 %    Sig: Apply 1 application topically 2 (two) times daily.    Dispense:  60 g    Refill:  0  . levocetirizine (XYZAL) 5 MG tablet    Sig: Take 1 tablet (5 mg total) by mouth every evening.    Dispense:  90 tablet    Refill:  1  . mirtazapine (REMERON) 7.5 MG tablet    Sig: Take 1 tablet (7.5 mg total) by mouth at bedtime.    Dispense:  90 tablet    Refill:  0     Follow-up: Return in about 6 weeks (around 12/08/2019).  Scarlette Calico, MD

## 2019-10-27 NOTE — Patient Instructions (Signed)
Major Depressive Disorder, Adult Major depressive disorder (MDD) is a mental health condition. It may also be called clinical depression or unipolar depression. MDD usually causes feelings of sadness, hopelessness, or helplessness. MDD can also cause physical symptoms. It can interfere with work, school, relationships, and other everyday activities. MDD may be mild, moderate, or severe. It may occur once (single episode major depressive disorder) or it may occur multiple times (recurrent major depressive disorder). What are the causes? The exact cause of this condition is not known. MDD is most likely caused by a combination of things, which may include:  Genetic factors. These are traits that are passed along from parent to child.  Individual factors. Your personality, your behavior, and the way you handle your thoughts and feelings may contribute to MDD. This includes personality traits and behaviors learned from others.  Physical factors, such as: ? Differences in the part of your brain that controls emotion. This part of your brain may be different than it is in people who do not have MDD. ? Long-term (chronic) medical or psychiatric illnesses.  Social factors. Traumatic experiences or major life changes may play a role in the development of MDD. What increases the risk? This condition is more likely to develop in women. The following factors may also make you more likely to develop MDD:  A family history of depression.  Troubled family relationships.  Abnormally low levels of certain brain chemicals.  Traumatic events in childhood, especially abuse or the loss of a parent.  Being under a lot of stress, or long-term stress, especially from upsetting life experiences or losses.  A history of: ? Chronic physical illness. ? Other mental health disorders. ? Substance abuse.  Poor living conditions.  Experiencing social exclusion or discrimination on a regular basis. What are the  signs or symptoms? The main symptoms of MDD typically include:  Constant depressed or irritable mood.  Loss of interest in things and activities. MDD symptoms may also include:  Sleeping or eating too much or too little.  Unexplained weight change.  Fatigue or low energy.  Feelings of worthlessness or guilt.  Difficulty thinking clearly or making decisions.  Thoughts of suicide or of harming others.  Physical agitation or weakness.  Isolation. Severe cases of MDD may also occur with other symptoms, such as:  Delusions or hallucinations, in which you imagine things that are not real (psychotic depression).  Low-level depression that lasts at least a year (chronic depression or persistent depressive disorder).  Extreme sadness and hopelessness (melancholic depression).  Trouble speaking and moving (catatonic depression). How is this diagnosed? This condition may be diagnosed based on:  Your symptoms.  Your medical history, including your mental health history. This may involve tests to evaluate your mental health. You may be asked questions about your lifestyle, including any drug and alcohol use, and how long you have had symptoms of MDD.  A physical exam.  Blood tests to rule out other conditions. You must have a depressed mood and at least four other MDD symptoms most of the day, nearly every day in the same 2-week timeframe before your health care provider can confirm a diagnosis of MDD. How is this treated? This condition is usually treated by mental health professionals, such as psychologists, psychiatrists, and clinical social workers. You may need more than one type of treatment. Treatment may include:  Psychotherapy. This is also called talk therapy or counseling. Types of psychotherapy include: ? Cognitive behavioral therapy (CBT). This type of therapy   teaches you to recognize unhealthy feelings, thoughts, and behaviors, and replace them with positive thoughts  and actions. ? Interpersonal therapy (IPT). This helps you to improve the way you relate to and communicate with others. ? Family therapy. This treatment includes members of your family.  Medicine to treat anxiety and depression, or to help you control certain emotions and behaviors.  Lifestyle changes, such as: ? Limiting alcohol and drug use. ? Exercising regularly. ? Getting plenty of sleep. ? Making healthy eating choices. ? Spending more time outdoors.  Treatments involving stimulation of the brain can be used in situations with extremely severe symptoms, or when medicine or other therapies do not work over time. These treatments include electroconvulsive therapy, transcranial magnetic stimulation, and vagal nerve stimulation. Follow these instructions at home: Activity  Return to your normal activities as told by your health care provider.  Exercise regularly and spend time outdoors as told by your health care provider. General instructions  Take over-the-counter and prescription medicines only as told by your health care provider.  Do not drink alcohol. If you drink alcohol, limit your alcohol intake to no more than 1 drink a day for nonpregnant women and 2 drinks a day for men. One drink equals 12 oz of beer, 5 oz of wine, or 1 oz of hard liquor. Alcohol can affect any antidepressant medicines you are taking. Talk to your health care provider about your alcohol use.  Eat a healthy diet and get plenty of sleep.  Find activities that you enjoy doing, and make time to do them.  Consider joining a support group. Your health care provider may be able to recommend a support group.  Keep all follow-up visits as told by your health care provider. This is important. Where to find more information National Alliance on Mental Illness  www.nami.org [place] National Institute of Mental Health  www.nimh.nih.gov National Suicide Prevention Lifeline  1-800-273-TALK (8255). This is  free, 24-hour help. Contact a health care provider if:  Your symptoms get worse.  You develop new symptoms. Get help right away if:  You self-harm.  You have serious thoughts about hurting yourself or others.  You see, hear, taste, smell, or feel things that are not present (hallucinate). This information is not intended to replace advice given to you by your health care provider. Make sure you discuss any questions you have with your health care provider. Document Revised: 04/10/2017 Document Reviewed: 11/07/2015 Elsevier Patient Education  2020 [employer]  

## 2019-10-31 ENCOUNTER — Other Ambulatory Visit: Payer: Self-pay | Admitting: Internal Medicine

## 2019-10-31 ENCOUNTER — Encounter: Payer: Self-pay | Admitting: Internal Medicine

## 2019-10-31 DIAGNOSIS — Z1211 Encounter for screening for malignant neoplasm of colon: Secondary | ICD-10-CM

## 2019-10-31 DIAGNOSIS — Z0001 Encounter for general adult medical examination with abnormal findings: Secondary | ICD-10-CM | POA: Insufficient documentation

## 2019-11-01 LAB — ALPHA-GAL PANEL
Beef IgE: <0.1 kU/L
Class: 0
Class: 0
Class: 0
Galactose-alpha-1,3-galactose IgE: <0.1 kU/L
LAMB/MUTTON IGE: <0.1 kU/L
Pork IgE: <0.1 kU/L

## 2019-11-05 ENCOUNTER — Encounter: Payer: Self-pay | Admitting: Internal Medicine

## 2019-11-07 NOTE — Telephone Encounter (Signed)
F/u   Returning call back to the nurse  

## 2019-11-07 NOTE — Telephone Encounter (Signed)
New message:   Pt is returning a call for MA. Please advise. 

## 2019-11-17 ENCOUNTER — Other Ambulatory Visit: Payer: Self-pay | Admitting: Internal Medicine

## 2019-11-17 ENCOUNTER — Telehealth: Payer: Self-pay | Admitting: Internal Medicine

## 2019-11-17 DIAGNOSIS — K219 Gastro-esophageal reflux disease without esophagitis: Secondary | ICD-10-CM

## 2019-11-17 DIAGNOSIS — F32A Depression, unspecified: Secondary | ICD-10-CM

## 2019-11-17 NOTE — Telephone Encounter (Signed)
New message:   Pt is calling and states that the [facility] is unable to get him scheduled because they are needing some office notes from the last colonoscopy the pt had. Please advise.

## 2019-11-18 ENCOUNTER — Encounter: Payer: Self-pay | Admitting: Gastroenterology

## 2019-12-09 ENCOUNTER — Encounter: Payer: Self-pay | Admitting: Internal Medicine

## 2019-12-12 DIAGNOSIS — Z1152 Encounter for screening for COVID-19: Secondary | ICD-10-CM | POA: Diagnosis not present

## 2019-12-12 DIAGNOSIS — R5381 Other malaise: Secondary | ICD-10-CM | POA: Diagnosis not present

## 2019-12-15 ENCOUNTER — Encounter: Payer: Self-pay | Admitting: Internal Medicine

## 2019-12-15 ENCOUNTER — Other Ambulatory Visit: Payer: Self-pay

## 2019-12-15 ENCOUNTER — Ambulatory Visit (INDEPENDENT_AMBULATORY_CARE_PROVIDER_SITE_OTHER): Payer: BC Managed Care – PPO | Admitting: Internal Medicine

## 2019-12-15 VITALS — BP 112/76 | HR 71 | Temp 98.5°F | Resp 16 | Ht 67.0 in | Wt 125.5 lb

## 2019-12-15 DIAGNOSIS — F450 Somatization disorder: Secondary | ICD-10-CM

## 2019-12-15 DIAGNOSIS — F329 Major depressive disorder, single episode, unspecified: Secondary | ICD-10-CM

## 2019-12-15 DIAGNOSIS — Z23 Encounter for immunization: Secondary | ICD-10-CM | POA: Diagnosis not present

## 2019-12-15 DIAGNOSIS — Z0000 Encounter for general adult medical examination without abnormal findings: Secondary | ICD-10-CM | POA: Diagnosis not present

## 2019-12-15 DIAGNOSIS — J4520 Mild intermittent asthma, uncomplicated: Secondary | ICD-10-CM | POA: Diagnosis not present

## 2019-12-15 DIAGNOSIS — F32A Depression, unspecified: Secondary | ICD-10-CM

## 2019-12-15 LAB — LIPID PANEL
Cholesterol: 156 mg/dL (ref <200)
HDL: 68 mg/dL (ref >=40)
LDL Cholesterol (Calc): 74 mg/dL (calc)
Non-HDL Cholesterol (Calc): 88 mg/dL (calc) (ref <130)
Total CHOL/HDL Ratio: 2.3 (calc) (ref <5.0)
Triglycerides: 62 mg/dL (ref <150)

## 2019-12-15 MED ORDER — MIRTAZAPINE 15 MG PO TABS: 15.0000 mg | ORAL_TABLET | Freq: Every day | ORAL | 1 refills | Status: DC

## 2019-12-15 MED ORDER — BREO ELLIPTA 200-25 MCG/INH IN AEPB: 1.0000 | INHALATION_SPRAY | Freq: Every day | RESPIRATORY_TRACT | 1 refills | Status: DC

## 2019-12-15 NOTE — Patient Instructions (Addendum)
Health Maintenance, Male Adopting a healthy lifestyle and getting preventive care are important in promoting health and wellness. Ask your health care provider about:  The right schedule for you to have regular tests and exams.  Things you can do on your own to prevent diseases and keep yourself healthy. What should I know about diet, weight, and exercise? Eat a healthy diet   Eat a diet that includes plenty of vegetables, fruits, low-fat dairy products, and lean protein.  Do not eat a lot of foods that are high in solid fats, added sugars, or sodium. Maintain a healthy weight Body mass index (BMI) is a measurement that can be used to identify possible weight problems. It estimates body fat based on height and weight. Your health care provider can help determine your BMI and help you achieve or maintain a healthy weight. Get regular exercise Get regular exercise. This is one of the most important things you can do for your health. Most adults should:  Exercise for at least 150 minutes each week. The exercise should increase your heart rate and make you sweat (moderate-intensity exercise).  Do strengthening exercises at least twice a week. This is in addition to the moderate-intensity exercise.  Spend less time sitting. Even light physical activity can be beneficial. Watch cholesterol and blood lipids Have your blood tested for lipids and cholesterol at 58 years of age, then have this test every 5 years. You may need to have your cholesterol levels checked more often if:  Your lipid or cholesterol levels are high.  You are older than 58 years of age.  You are at high risk for heart disease. What should I know about cancer screening? Many types of cancers can be detected early and may often be prevented. Depending on your health history and family history, you may need to have cancer screening at various ages. This may include screening for:  Colorectal cancer.  Prostate  cancer.  Skin cancer.  Lung cancer. What should I know about heart disease, diabetes, and high blood pressure? Blood pressure and heart disease  High blood pressure causes heart disease and increases the risk of stroke. This is more likely to develop in people who have high blood pressure readings, are of African descent, or are overweight.  Talk with your health care provider about your target blood pressure readings.  Have your blood pressure checked: ? Every 3-5 years if you are 18-39 years of age. ? Every year if you are 40 years old or older.  If you are between the ages of 65 and 75 and are a current or former smoker, ask your health care provider if you should have a one-time screening for abdominal aortic aneurysm (AAA). Diabetes Have regular diabetes screenings. This checks your fasting blood sugar level. Have the screening done:  Once every three years after age 45 if you are at a normal weight and have a low risk for diabetes.  More often and at a younger age if you are overweight or have a high risk for diabetes. What should I know about preventing infection? Hepatitis B If you have a higher risk for hepatitis B, you should be screened for this virus. Talk with your health care provider to find out if you are at risk for hepatitis B infection. Hepatitis C Blood testing is recommended for:  Everyone born from 1945 through 1965.  Anyone with known risk factors for hepatitis C. Sexually transmitted infections (STIs)  You should be screened each year   for STIs, including gonorrhea and chlamydia, if: ? You are sexually active and are younger than 58 years of age. ? You are older than 58 years of age and your health care provider tells you that you are at risk for this type of infection. ? Your sexual activity has changed since you were last screened, and you are at increased risk for chlamydia or gonorrhea. Ask your health care provider if you are at risk.  Ask your  health care provider about whether you are at high risk for HIV. Your health care provider may recommend a prescription medicine to help prevent HIV infection. If you choose to take medicine to prevent HIV, you should first get tested for HIV. You should then be tested every 3 months for as long as you are taking the medicine. Follow these instructions at home: Lifestyle  Do not use any products that contain nicotine or tobacco, such as cigarettes, e-cigarettes, and chewing tobacco. If you need help quitting, ask your health care provider.  Do not use street drugs.  Do not share needles.  Ask your health care provider for help if you need support or information about quitting drugs. Alcohol use  Do not drink alcohol if your health care provider tells you not to drink.  If you drink alcohol: ? Limit how much you have to 0-2 drinks a day. ? Be aware of how much alcohol is in your drink. In the [place] one drink equals one 12 oz bottle of beer (355 mL), one 5 oz glass of wine (148 mL), or one 1 oz glass of hard liquor (44 mL). General instructions  Schedule regular health, dental, and eye exams.  Stay current with your vaccines.  Tell your health care provider if: ? You often feel depressed. ? You have ever been abused or do not feel safe at home. Summary  Adopting a healthy lifestyle and getting preventive care are important in promoting health and wellness.  Follow your health care provider's instructions about healthy diet, exercising, and getting tested or screened for diseases.  Follow your health care provider's instructions on monitoring your cholesterol and blood pressure. This information is not intended to replace advice given to you by your health care provider. Make sure you discuss any questions you have with your health care provider. Document Revised: 04/21/2018 Document Reviewed: 04/21/2018 Elsevier Patient Education  2020 [employer].  http://www.aaaai.org/conditions-and-treatments/asthma">  Asthma, Adult  Asthma is a long-term (chronic) condition that causes recurrent episodes in which the airways become tight and narrow. The airways are the passages that lead from the nose and mouth down into the lungs. Asthma episodes, also called asthma attacks, can cause coughing, wheezing, shortness of breath, and chest pain. The airways can also fill with mucus. During an attack, it can be difficult to breathe. Asthma attacks can range from minor to life threatening. Asthma cannot be cured, but medicines and lifestyle changes can help control it and treat acute attacks. What are the causes? This condition is believed to be caused by inherited (genetic) and environmental factors, but its exact cause is not known. There are many things that can bring on an asthma attack or make asthma symptoms worse (triggers). Asthma triggers are different for each person. Common triggers include:  Mold.  Dust.  Cigarette smoke.  Cockroaches.  Things that can cause allergy symptoms (allergens), such as animal dander or pollen from trees or grass.  Air pollutants such as household cleaners, wood smoke, smog, or Therapist, occupational.  Cold air, weather changes, and winds (which increase molds and pollen in the air).  Strong emotional expressions such as crying or laughing hard.  Stress.  Certain medicines (such as aspirin) or types of medicines (such as beta-blockers).  Sulfites in foods and drinks. Foods and drinks that may contain sulfites include dried fruit, potato chips, and sparkling grape juice.  Infections or inflammatory conditions such as the flu, a cold, or inflammation of the nasal membranes (rhinitis).  Gastroesophageal reflux disease (GERD).  Exercise or strenuous activity. What are the signs or symptoms? Symptoms of this condition may occur right after asthma is triggered or many hours later. Symptoms include:  Wheezing.  This can sound like whistling when you breathe.  Excessive nighttime or early morning coughing.  Frequent or severe coughing with a common cold.  Chest tightness.  Shortness of breath.  Tiredness (fatigue) with minimal activity. How is this diagnosed? This condition is diagnosed based on:  Your medical history.  A physical exam.  Tests, which may include: ? Lung function studies and pulmonary studies (spirometry). These tests can evaluate the flow of air in your lungs. ? Allergy tests. ? Imaging tests, such as X-rays. How is this treated? There is no cure for this condition, but treatment can help control your symptoms. Treatment for asthma usually involves:  Identifying and avoiding your asthma triggers.  Using medicines to control your symptoms. Generally, two types of medicines are used to treat asthma: ? Controller medicines. These help prevent asthma symptoms from occurring. They are usually taken every day. ? Fast-acting reliever or rescue medicines. These quickly relieve asthma symptoms by widening the narrow and tight airways. They are used as needed and provide short-term relief.  Using supplemental oxygen. This may be needed during a severe episode.  Using other medicines, such as: ? Allergy medicines, such as antihistamines, if your asthma attacks are triggered by allergens. ? Immune medicines (immunomodulators). These are medicines that help control the immune system.  Creating an asthma action plan. An asthma action plan is a written plan for managing and treating your asthma attacks. This plan includes: ? A list of your asthma triggers and how to avoid them. ? Information about when medicines should be taken and when their dosage should be changed. ? Instructions about using a device called a peak flow meter. A peak flow meter measures how well the lungs are working and the severity of your asthma. It helps you monitor your condition. Follow these instructions  at home: Controlling your home environment Control your home environment in the following ways to help avoid triggers and prevent asthma attacks:  Change your heating and air conditioning filter regularly.  Limit your use of fireplaces and wood stoves.  Get rid of pests (such as roaches and mice) and their droppings.  Throw away plants if you see mold on them.  Clean floors and dust surfaces regularly. Use unscented cleaning products.  Try to have someone else vacuum for you regularly. Stay out of rooms while they are being vacuumed and for a short while afterward. If you vacuum, use a dust mask from a hardware store, a double-layered or microfilter vacuum cleaner bag, or a vacuum cleaner with a HEPA filter.  Replace carpet with wood, tile, or vinyl flooring. Carpet can trap dander and dust.  Use allergy-proof pillows, mattress covers, and box spring covers.  Keep your bedroom a trigger-free room.  Avoid pets and keep windows closed when allergens are in the air.  Wash beddings  every week in hot water and dry them in a dryer.  Use blankets that are made of polyester or cotton.  Clean bathrooms and kitchens with bleach. If possible, have someone repaint the walls in these rooms with mold-resistant paint. Stay out of the rooms that are being cleaned and painted.  Wash your hands often with soap and water. If soap and water are not available, use hand sanitizer.  Do not allow anyone to smoke in your home. General instructions  Take over-the-counter and prescription medicines only as told by your health care provider. ? Speak with your health care provider if you have questions about how or when to take the medicines. ? Make note if you are requiring more frequent dosages.  Do not use any products that contain nicotine or tobacco, such as cigarettes and e-cigarettes. If you need help quitting, ask your health care provider. Also, avoid being exposed to secondhand smoke.  Use a  peak flow meter as told by your health care provider. Record and keep track of the readings.  Understand and use the asthma action plan to help minimize, or stop an asthma attack, without needing to seek medical care.  Make sure you stay up to date on your yearly vaccinations as told by your health care provider. This may include vaccines for the flu and pneumonia.  Avoid outdoor activities when allergen counts are high and when air quality is low.  Wear a ski mask that covers your nose and mouth during outdoor winter activities. Exercise indoors on cold days if you can.  Warm up before exercising, and take time for a cool-down period after exercise.  Keep all follow-up visits as told by your health care provider. This is important. Where to find more information  For information about asthma, turn to the Centers for Disease Control and Prevention at http://www.mills-berg.com/.htm  For air quality information, turn to [employer] at [employer] Contact a health care provider if:  You have wheezing, shortness of breath, or a cough even while you are taking medicine to prevent attacks.  The mucus you cough up (sputum) is thicker than usual.  Your sputum changes from clear or white to yellow, green, gray, or bloody.  Your medicines are causing side effects, such as a rash, itching, swelling, or trouble breathing.  You need to use a reliever medicine more than 2-3 times a week.  Your peak flow reading is still at 50-79% of your personal best after following your action plan for 1 hour.  You have a fever. Get help right away if:  You are getting worse and do not respond to treatment during an asthma attack.  You are short of breath when at rest or when doing very little physical activity.  You have difficulty eating, drinking, or talking.  You have chest pain or tightness.  You develop a fast heartbeat or palpitations.  You have a bluish color to your lips or  fingernails.  You are light-headed or dizzy, or you faint.  Your peak flow reading is less than 50% of your personal best.  You feel too tired to breathe normally. Summary  Asthma is a long-term (chronic) condition that causes recurrent episodes in which the airways become tight and narrow. These episodes can cause coughing, wheezing, shortness of breath, and chest pain.  Asthma cannot be cured, but medicines and lifestyle changes can help control it and treat acute attacks.  Make sure you understand how to avoid triggers and how and when to use  your medicines.  Asthma attacks can range from minor to life threatening. Get help right away if you have an asthma attack and do not respond to treatment with your usual rescue medicines. This information is not intended to replace advice given to you by your health care provider. Make sure you discuss any questions you have with your health care provider. Document Revised: 07/01/2018 Document Reviewed: 06/02/2016 Elsevier Patient Education  2020 [employer]

## 2019-12-15 NOTE — Progress Notes (Signed)
Subjective:  Patient ID: Wayne Silva, male    DOB: [birth year]  Age: 59 y.o. MRN: [id number]  CC: Annual Exam, Asthma, and Depression  This visit occurred during the SARS-CoV-2 public health emergency.  Safety protocols were in place, including screening questions prior to the visit, additional usage of staff PPE, and extensive cleaning of exam room while observing appropriate contact time as indicated for disinfecting solutions.    HPI Wayne Silva presents for a CPX.  He tells me his mood is much better and he has increased the mirtazapine dose to 15 mg a day.  He recently went to a self-help conference in [place] and has been able to get some relief from what he describes as living inside of his own head.  is appetite is good and has gained some weight.  He continues to have intermittent wheezing.  He has been using a rescue inhaler but has not been using the LABA/ICS combination.  Outpatient Medications Prior to Visit  Medication Sig Dispense Refill  . albuterol (PROAIR HFA) 108 (90 Base) MCG/ACT inhaler Inhale 2 puffs into the lungs every 6 (six) hours as needed for wheezing or shortness of breath.    Marland Kitchen azelastine (ASTELIN) 0.1 % nasal spray Place 2 sprays into both nostrils 2 (two) times daily.    Marland Kitchen CIALIS 20 MG tablet TAKE 1 TABLET BY ORAL ROUTE EVERY DAY  10  . fluocinonide ointment (LIDEX) 0.05 % Apply 1 application topically 2 (two) times daily. 60 g 0  . levocetirizine (XYZAL) 5 MG tablet Take 1 tablet (5 mg total) by mouth every evening. 90 tablet 1  . montelukast (SINGULAIR) 10 MG tablet Take 1 tablet (10 mg total) by mouth at bedtime. 90 tablet 3  . Omega-3 Fatty Acids (FISH OIL PO) Take by mouth daily.    Marland Kitchen triamcinolone (NASACORT ALLERGY 24HR) 55 MCG/ACT AERO nasal inhaler Place 2 sprays into the nose daily.    . valACYclovir (VALTREX) 1000 MG tablet Take 1 tablet by mouth daily.    . fluticasone furoate-vilanterol (BREO ELLIPTA) 200-25 MCG/INH AEPB Inhale 1 puff into the lungs  daily. 84 each 3  . mirtazapine (REMERON) 7.5 MG tablet Take 1 tablet (7.5 mg total) by mouth at bedtime. 90 tablet 0  . alfuzosin (UROXATRAL) 10 MG 24 hr tablet TAKE 1 TABLET (10 MG TOTAL) BY MOUTH DAILY WITH BREAKFAST. (Patient not taking: Reported on 12/15/2019) 90 tablet 1   No facility-administered medications prior to visit.    ROS Review of Systems  Constitutional: Negative.  Negative for chills, diaphoresis, fatigue and fever.  HENT: Negative.   Eyes: Negative.   Respiratory: Positive for wheezing. Negative for cough, chest tightness and shortness of breath.   Cardiovascular: Negative for chest pain, palpitations and leg swelling.  Gastrointestinal: Negative for abdominal pain, constipation, diarrhea, nausea and vomiting.  Endocrine: Negative.   Genitourinary: Negative.  Negative for difficulty urinating.  Musculoskeletal: Negative.  Negative for arthralgias and neck pain.  Skin: Negative.  Negative for color change, pallor and rash.  Neurological: Negative for dizziness, weakness, numbness and headaches.  Hematological: Negative for adenopathy. Does not bruise/bleed easily.  Psychiatric/Behavioral: Positive for dysphoric mood. Negative for agitation, behavioral problems, confusion, decreased concentration, hallucinations, self-injury, sleep disturbance and suicidal ideas. The patient is nervous/anxious. The patient is not hyperactive.     Objective:  BP 112/76 (BP Location: Left Arm, Patient Position: Sitting, Cuff Size: Normal)   Pulse 71   Temp 98.5 F (36.9 C) (Oral)   Resp  16   Ht 5\' 7"  (1.702 m)   Wt 125 lb 8 oz (56.9 kg)   SpO2 96%   BMI 19.66 kg/m   BP Readings from Last 3 Encounters:  12/15/19 112/76  10/27/19 112/78  10/20/19 118/76    Wt Readings from Last 3 Encounters:  12/15/19 125 lb 8 oz (56.9 kg)  10/27/19 123 lb (55.8 kg)  10/20/19 120 lb (54.4 kg)    Physical Exam Vitals reviewed.  Constitutional:      Appearance: Normal appearance. He is not  ill-appearing.  HENT:     Nose: Nose normal.     Mouth/Throat:     Mouth: Mucous membranes are moist.  Eyes:     General: No scleral icterus.    Conjunctiva/sclera: Conjunctivae normal.  Cardiovascular:     Rate and Rhythm: Normal rate and regular rhythm.     Heart sounds: No murmur heard.   Pulmonary:     Effort: Pulmonary effort is normal. No tachypnea, accessory muscle usage or respiratory distress.     Breath sounds: Normal air entry. Examination of the right-lower field reveals wheezing and rhonchi. Examination of the left-lower field reveals wheezing and rhonchi. Wheezing and rhonchi present. No decreased breath sounds or rales.  Musculoskeletal:        General: Normal range of motion.     Cervical back: Neck supple.     Right lower leg: No edema.     Left lower leg: No edema.  Lymphadenopathy:     Cervical: No cervical adenopathy.  Skin:    General: Skin is warm and dry.     Coloration: Skin is not pale.  Neurological:     General: No focal deficit present.     Mental Status: He is alert and oriented to person, place, and time. Mental status is at baseline.  Psychiatric:        Attention and Perception: Attention normal.        Mood and Affect: Mood is anxious. Mood is not depressed or elated. Affect is not blunt, flat or angry.        Speech: Speech normal. Speech is not rapid and pressured or delayed.        Behavior: Behavior normal. Behavior is not agitated, slowed, aggressive or hyperactive. Behavior is cooperative.        Thought Content: Thought content normal. Thought content is not paranoid or delusional. Thought content does not include homicidal or suicidal ideation.        Cognition and Memory: Cognition normal.     Lab Results  Component Value Date   WBC 7.9 10/20/2019   HGB 14.8 10/20/2019   HCT 44.0 10/20/2019   PLT 156.0 10/20/2019   GLUCOSE 111 (H) 10/20/2019   CHOL 156 12/15/2019   TRIG 62 12/15/2019   HDL 68 12/15/2019   LDLCALC 74 12/15/2019     ALT 31 10/20/2019   AST 23 10/20/2019   NA 135 10/20/2019   K 4.0 10/20/2019   CL 99 10/20/2019   CREATININE 0.87 10/20/2019   BUN 32 (H) 10/20/2019   CO2 31 10/20/2019   TSH 2.11 10/20/2019   PSA 1.13 10/20/2019   HGBA1C 5.0 10/20/2019    DG Chest 2 View  Result Date: 06/08/2018 CLINICAL DATA:  Cough and wheezing for 3 days.  Former smoker. EXAM: CHEST - 2 VIEW COMPARISON:  05/01/2014 FINDINGS: The heart size and mediastinal contours are within normal limits. Both lungs are clear. The visualized skeletal structures are unremarkable.  IMPRESSION: No active cardiopulmonary disease. Electronically Signed   By: Myles Rosenthal M.D.   On: 06/08/2018 08:25    Assessment & Plan:   Wayne Silva was seen today for annual exam, asthma and depression.  Diagnoses and all orders for this visit:  Routine general medical examination at a health care facility- Exam completed, labs reviewed-he has a low ASCVD risk score so I did not recommend a statin for CV risk reduction., vaccines reviewed and updated, cancer screenings are up-to-date, patient education was given. -     Lipid panel; Future -     Lipid panel  Depression with somatization- Improvement noted.  Will continue mirtazapine at 15 mg a day. -     mirtazapine (REMERON) 15 MG tablet; Take 1 tablet (15 mg total) by mouth at bedtime.  Mild intermittent asthma without complication- I have asked him to restart the ICS/LABA inhaler. -     fluticasone furoate-vilanterol (BREO ELLIPTA) 200-25 MCG/INH AEPB; Inhale 1 puff into the lungs daily.  Need for pneumococcal vaccination -     Pneumococcal polysaccharide vaccine 23-valent greater than or equal to 2yo subcutaneous/IM   I have discontinued Kendricks Reinwald's alfuzosin and mirtazapine. I am also having him start on mirtazapine. Additionally, I am having him maintain his valACYclovir, Omega-3 Fatty Acids (FISH OIL PO), Cialis, albuterol, montelukast, azelastine, triamcinolone, fluocinonide ointment,  levocetirizine, and Breo Ellipta.  Meds ordered this encounter  Medications  . fluticasone furoate-vilanterol (BREO ELLIPTA) 200-25 MCG/INH AEPB    Sig: Inhale 1 puff into the lungs daily.    Dispense:  120 each    Refill:  1  . mirtazapine (REMERON) 15 MG tablet    Sig: Take 1 tablet (15 mg total) by mouth at bedtime.    Dispense:  90 tablet    Refill:  1     Follow-up: Return in about 6 months (around 06/16/2020).  Sanda Linger, MD

## 2019-12-17 ENCOUNTER — Encounter: Payer: Self-pay | Admitting: Internal Medicine

## 2019-12-30 ENCOUNTER — Other Ambulatory Visit (INDEPENDENT_AMBULATORY_CARE_PROVIDER_SITE_OTHER): Payer: Self-pay | Admitting: Otolaryngology

## 2020-01-05 ENCOUNTER — Other Ambulatory Visit: Payer: Self-pay | Admitting: Pediatrics

## 2020-01-15 DIAGNOSIS — Z20822 Contact with and (suspected) exposure to covid-19: Secondary | ICD-10-CM | POA: Diagnosis not present

## 2020-01-15 DIAGNOSIS — B349 Viral infection, unspecified: Secondary | ICD-10-CM | POA: Diagnosis not present

## 2020-01-18 ENCOUNTER — Other Ambulatory Visit: Payer: Self-pay | Admitting: Internal Medicine

## 2020-01-18 ENCOUNTER — Ambulatory Visit (INDEPENDENT_AMBULATORY_CARE_PROVIDER_SITE_OTHER): Payer: BC Managed Care – PPO | Admitting: Gastroenterology

## 2020-01-18 ENCOUNTER — Encounter: Payer: Self-pay | Admitting: Gastroenterology

## 2020-01-18 VITALS — BP 88/52 | HR 70 | Ht 67.0 in | Wt 132.0 lb

## 2020-01-18 DIAGNOSIS — R12 Heartburn: Secondary | ICD-10-CM

## 2020-01-18 DIAGNOSIS — R110 Nausea: Secondary | ICD-10-CM | POA: Diagnosis not present

## 2020-01-18 DIAGNOSIS — K219 Gastro-esophageal reflux disease without esophagitis: Secondary | ICD-10-CM | POA: Diagnosis not present

## 2020-01-18 MED ORDER — OMEPRAZOLE 40 MG PO CPDR: 40.0000 mg | DELAYED_RELEASE_CAPSULE | Freq: Every day | ORAL | 6 refills | Status: DC

## 2020-01-18 NOTE — Progress Notes (Signed)
Wayne Silva    [id number]    58/31/1962  Primary Care Physician:Jones, Bernadene Bell, MD  Referring Physician: [name], MD [address] [place],  [place] [address]   Chief complaint:  Nausea  HPI:  59 year old very pleasant gentleman here for new patient visit with complaints of intractable nausea  Functional integrative medicine doctor, was advised to start digestive enzymes which is helping to some extent  Nausea almost daily, has acid taste and heartburn after meals.  Feels like he has inflammation in the throat area No vomiting Bowel movements are regular, as once a day  Lost 10 lbs last year, wasn't eating much during covid pandemic   Depressed, apathetic in the morning even pre covid, started fitness program and holistic approach, started loosing more weight.  He is no longer losing weight, has somewhat stabilized.  He is watches his diet tries to minimize sugar intake.  Remeron is helping with mood and also weight  He is up-to-date with colorectal cancer screening, he had colonoscopy in 2014 per patient and was normal, report is not available during this visit to review  No family history of GI malignancy  Outpatient Encounter Medications as of 01/18/2020  Medication Sig  . albuterol (PROAIR HFA) 108 (90 Base) MCG/ACT inhaler Inhale 2 puffs into the lungs every 6 (six) hours as needed for wheezing or shortness of breath.  Marland Kitchen azelastine (ASTELIN) 0.1 % nasal spray Place 2 sprays into both nostrils 2 (two) times daily.  Marland Kitchen CIALIS 20 MG tablet TAKE 1 TABLET BY ORAL ROUTE EVERY DAY  . fluocinonide ointment (LIDEX) 0.05 % Apply 1 application topically 2 (two) times daily.  . fluticasone furoate-vilanterol (BREO ELLIPTA) 200-25 MCG/INH AEPB Inhale 1 puff into the lungs daily.  Marland Kitchen levocetirizine (XYZAL) 5 MG tablet Take 1 tablet (5 mg total) by mouth every evening.  . mirtazapine (REMERON) 15 MG tablet Take 1 tablet (15 mg total) by mouth at bedtime.  .  montelukast (SINGULAIR) 10 MG tablet TAKE 1 TABLET BY MOUTH EVERYDAY AT BEDTIME  . Omega-3 Fatty Acids (FISH OIL PO) Take by mouth daily.  Marland Kitchen triamcinolone (NASACORT ALLERGY 24HR) 55 MCG/ACT AERO nasal inhaler Place 2 sprays into the nose daily.  . valACYclovir (VALTREX) 1000 MG tablet Take 1 tablet by mouth daily.  . [DISCONTINUED] mirtazapine (REMERON) 7.5 MG tablet TAKE 1 TABLET BY MOUTH EVERYDAY AT BEDTIME   No facility-administered encounter medications on file as of 01/18/2020.    Allergies as of 01/18/2020  . (No Known Allergies)    Past Medical History:  Diagnosis Date  . Alcoholic (HCC)   . Anxiety   . Asthma   . Spastic bladder     Past Surgical History:  Procedure Laterality Date  . HEMORRHOID SURGERY    . KIDNEY STONE SURGERY      Family History  Problem Relation Age of Onset  . Skin cancer Mother   . Pancreatic cancer Mother   . Colon cancer Father        dx in his late 32s or early 52s  . Alzheimer's disease Father   . Pancreatic cancer Paternal Grandfather   . Alzheimer's disease Maternal Grandmother   . Allergic rhinitis Neg Hx   . Angioedema Neg Hx   . Asthma Neg Hx   . Eczema Neg Hx   . Urticaria Neg Hx   . Immunodeficiency Neg Hx     Social History   Socioeconomic History  .  Marital status: Single    Spouse name: Not on file  . Number of children: Not on file  . Years of education: Not on file  . Highest education level: Not on file  Occupational History  . Not on file  Tobacco Use  . Smoking status: Former Smoker    Packs/day: 0.25    Years: 10.00    Pack years: 2.50    Types: Cigarettes    Quit date: 08/22/2004    Years since quitting: 15.4  . Smokeless tobacco: Never Used  Vaping Use  . Vaping Use: Never used  Substance and Sexual Activity  . Alcohol use: No    Alcohol/week: 0.0 standard drinks    Comment: was an alcoholic (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 ago as of 01/18/20)  . Drug use: No  . Sexual activity: Yes    Partners: Female    Birth  control/protection: Condom  Other Topics Concern  . Not on file  Social History Narrative  . Not on file   Social Determinants of Health   Financial Resource Strain:   . Difficulty of Paying Living Expenses: Not on file  Food Insecurity:   . Worried About Programme researcher, broadcasting/film/video in the Last Year: Not on file  . Ran Out of Food in the Last Year: Not on file  Transportation Needs:   . Lack of Transportation (Medical): Not on file  . Lack of Transportation (Non-Medical): Not on file  Physical Activity:   . Days of Exercise per Week: Not on file  . Minutes of Exercise per Session: Not on file  Stress:   . Feeling of Stress : Not on file  Social Connections:   . Frequency of Communication with Friends and Family: Not on file  . Frequency of Social Gatherings with Friends and Family: Not on file  . Attends Religious Services: Not on file  . Active Member of Clubs or Organizations: Not on file  . Attends Banker Meetings: Not on file  . Marital Status: Not on file  Intimate Partner Violence:   . Fear of Current or Ex-Partner: Not on file  . Emotionally Abused: Not on file  . Physically Abused: Not on file  . Sexually Abused: Not on file      Review of systems: All other review of systems negative except as mentioned in the HPI.   Physical Exam: Vitals:   01/18/20 0828  BP: (!) 88/52  Pulse: 70  SpO2: 96%   Body mass index is 20.67 kg/m. Gen:      No acute distress HEENT:  sclera anicteric Abd:      soft, non-tender; no palpable masses, no distension Ext:    No edema Neuro: alert and oriented x 3 Psych: normal mood and affect  Data Reviewed:  Reviewed labs, radiology imaging, old records and pertinent past GI work up   Assessment and Plan/Recommendations:  59 year old very pleasant gentleman here for new patient visit with complaints of nausea, heartburn and atypical chest pain  Likely etiology uncontrolled gastroesophageal acid reflux Schedule for  EGD to exclude erosive esophagitis, eosinophilic esophagitis or peptic stricture  Start omeprazole 40 mg daily for possible GERD Discussed antireflux measures  Return after EGD as needed  The risks and benefits as well as alternatives of endoscopic procedure(s) have been discussed and reviewed. All questions answered. The patient agrees to proceed.  Patient says he had normal colonoscopy in 2014, he does not recall where he had it, he will try to get  [place] that information so we can request the report to review and determine time interval for recall colonoscopy for colorectal cancer screening   The patient was provided an opportunity to ask questions and all were answered. The patient agreed with the plan and demonstrated an understanding of the instructions.  Iona Beard , MD    CC: [name], MD

## 2020-01-18 NOTE — Patient Instructions (Signed)
Follow up in 4-6 months  We have sent Omeprazole to your pharmacy  You have been scheduled for an endoscopy. Please follow written instructions given to you at your visit today. If you use inhalers (even only as needed), please bring them with you on the day of your procedure.   Due to recent changes in healthcare laws, you may see the results of your imaging and laboratory studies on MyChart before your provider has had a chance to review them.  We understand that in some cases there may be results that are confusing or concerning to you. Not all laboratory results come back in the same time frame and the provider may be waiting for multiple results in order to interpret others.  Please give [place] 48 hours in order for your provider to thoroughly review all the results before contacting the office for clarification of your results.    Gastroesophageal Reflux Disease, Adult Gastroesophageal reflux (GER) happens when acid from the stomach flows up into the tube that connects the mouth and the stomach (esophagus). Normally, food travels down the esophagus and stays in the stomach to be digested. However, when a person has GER, food and stomach acid sometimes move back up into the esophagus. If this becomes a more serious problem, the person may be diagnosed with a disease called gastroesophageal reflux disease (GERD). GERD occurs when the reflux:  Happens often.  Causes frequent or severe symptoms.  Causes problems such as damage to the esophagus. When stomach acid comes in contact with the esophagus, the acid may cause soreness (inflammation) in the esophagus. Over time, GERD may create small holes (ulcers) in the lining of the esophagus. What are the causes? This condition is caused by a problem with the muscle between the esophagus and the stomach (lower esophageal sphincter, or LES). Normally, the LES muscle closes after food passes through the esophagus to the stomach. When the LES is weakened  or abnormal, it does not close properly, and that allows food and stomach acid to go back up into the esophagus. The LES can be weakened by certain dietary substances, medicines, and medical conditions, including:  Tobacco use.  Pregnancy.  Having a hiatal hernia.  Alcohol use.  Certain foods and beverages, such as coffee, chocolate, onions, and peppermint. What increases the risk? You are more likely to develop this condition if you:  Have an increased body weight.  Have a connective tissue disorder.  Use NSAID medicines. What are the signs or symptoms? Symptoms of this condition include:  Heartburn.  Difficult or painful swallowing.  The feeling of having a lump in the throat.  Abitter taste in the mouth.  Bad breath.  Having a large amount of saliva.  Having an upset or bloated stomach.  Belching.  Chest pain. Different conditions can cause chest pain. Make sure you see your health care provider if you experience chest pain.  Shortness of breath or wheezing.  Ongoing (chronic) cough or a night-time cough.  Wearing away of tooth enamel.  Weight loss. How is this diagnosed? Your health care provider will take a medical history and perform a physical exam. To determine if you have mild or severe GERD, your health care provider may also monitor how you respond to treatment. You may also have tests, including:  A test to examine your stomach and esophagus with a small camera (endoscopy).  A test thatmeasures the acidity level in your esophagus.  A test thatmeasures how much pressure is on your esophagus.  A barium swallow or modified barium swallow test to show the shape, size, and functioning of your esophagus. How is this treated? The goal of treatment is to help relieve your symptoms and to prevent complications. Treatment for this condition may vary depending on how severe your symptoms are. Your health care provider may recommend:  Changes to your  diet.  Medicine.  Surgery. Follow these instructions at home: Eating and drinking   Follow a diet as recommended by your health care provider. This may involve avoiding foods and drinks such as: ? Coffee and tea (with or without caffeine). ? Drinks that containalcohol. ? Energy drinks and sports drinks. ? Carbonated drinks or sodas. ? Chocolate and cocoa. ? Peppermint and mint flavorings. ? Garlic and onions. ? Horseradish. ? Spicy and acidic foods, including peppers, chili powder, curry powder, vinegar, hot sauces, and barbecue sauce. ? Citrus fruit juices and citrus fruits, such as oranges, lemons, and limes. ? Tomato-based foods, such as red sauce, chili, salsa, and pizza with red sauce. ? Fried and fatty foods, such as donuts, french fries, potato chips, and high-fat dressings. ? High-fat meats, such as hot dogs and fatty cuts of red and white meats, such as rib eye steak, sausage, ham, and bacon. ? High-fat dairy items, such as whole milk, butter, and cream cheese.  Eat small, frequent meals instead of large meals.  Avoid drinking large amounts of liquid with your meals.  Avoid eating meals during the 2-3 hours before bedtime.  Avoid lying down right after you eat.  Do not exercise right after you eat. Lifestyle   Do not use any products that contain nicotine or tobacco, such as cigarettes, e-cigarettes, and chewing tobacco. If you need help quitting, ask your health care provider.  Try to reduce your stress by using methods such as yoga or meditation. If you need help reducing stress, ask your health care provider.  If you are overweight, reduce your weight to an amount that is healthy for you. Ask your health care provider for guidance about a safe weight loss goal. General instructions  Pay attention to any changes in your symptoms.  Take over-the-counter and prescription medicines only as told by your health care provider. Do not take aspirin, ibuprofen, or  other NSAIDs unless your health care provider told you to do so.  Wear loose-fitting clothing. Do not wear anything tight around your waist that causes pressure on your abdomen.  Raise (elevate) the head of your bed about 6 inches (15 cm).  Avoid bending over if this makes your symptoms worse.  Keep all follow-up visits as told by your health care provider. This is important. Contact a health care provider if:  You have: ? New symptoms. ? Unexplained weight loss. ? Difficulty swallowing or it hurts to swallow. ? Wheezing or a persistent cough. ? A hoarse voice.  Your symptoms do not improve with treatment. Get help right away if you:  Have pain in your arms, neck, jaw, teeth, or back.  Feel sweaty, dizzy, or light-headed.  Have chest pain or shortness of breath.  Vomit and your vomit looks like blood or coffee grounds.  Faint.  Have stool that is bloody or black.  Cannot swallow, drink, or eat. Summary  Gastroesophageal reflux happens when acid from the stomach flows up into the esophagus. GERD is a disease in which the reflux happens often, causes frequent or severe symptoms, or causes problems such as damage to the esophagus.  Treatment for this condition  may vary depending on how severe your symptoms are. Your health care provider may recommend diet and lifestyle changes, medicine, or surgery.  Contact a health care provider if you have new or worsening symptoms.  Take over-the-counter and prescription medicines only as told by your health care provider. Do not take aspirin, ibuprofen, or other NSAIDs unless your health care provider told you to do so.  Keep all follow-up visits as told by your health care provider. This is important. This information is not intended to replace advice given to you by your health care provider. Make sure you discuss any questions you have with your health care provider. Document Revised: 11/04/2017 Document Reviewed:  11/04/2017 Elsevier Patient Education  2020 [employer].   Food Choices for Gastroesophageal Reflux Disease, Adult When you have gastroesophageal reflux disease (GERD), the foods you eat and your eating habits are very important. Choosing the right foods can help ease your discomfort. Think about working with a nutrition specialist (dietitian) to help you make good choices. What are tips for following this plan?  Meals  Choose healthy foods that are low in fat, such as fruits, vegetables, whole grains, low-fat dairy products, and lean meat, fish, and poultry.  Eat small meals often instead of 3 large meals a day. Eat your meals slowly, and in a place where you are relaxed. Avoid bending over or lying down until 2-3 hours after eating.  Avoid eating meals 2-3 hours before bed.  Avoid drinking a lot of liquid with meals.  Cook foods using methods other than frying. Bake, grill, or broil food instead.  Avoid or limit: ? Chocolate. ? Peppermint or spearmint. ? Alcohol. ? Pepper. ? Black and decaffeinated coffee. ? Black and decaffeinated tea. ? Bubbly (carbonated) soft drinks. ? Caffeinated energy drinks and soft drinks.  Limit high-fat foods such as: ? Fatty meat or fried foods. ? Whole milk, cream, butter, or ice cream. ? Nuts and nut butters. ? Pastries, donuts, and sweets made with butter or shortening.  Avoid foods that cause symptoms. These foods may be different for everyone. Common foods that cause symptoms include: ? Tomatoes. ? Oranges, lemons, and limes. ? Peppers. ? Spicy food. ? Onions and garlic. ? Vinegar. Lifestyle  Maintain a healthy weight. Ask your doctor what weight is healthy for you. If you need to lose weight, work with your doctor to do so safely.  Exercise for at least 30 minutes for 5 or more days each week, or as told by your doctor.  Wear loose-fitting clothes.  Do not smoke. If you need help quitting, ask your doctor.  Sleep with the  head of your bed higher than your feet. Use a wedge under the mattress or blocks under the bed frame to raise the head of the bed. Summary  When you have gastroesophageal reflux disease (GERD), food and lifestyle choices are very important in easing your symptoms.  Eat small meals often instead of 3 large meals a day. Eat your meals slowly, and in a place where you are relaxed.  Limit high-fat foods such as fatty meat or fried foods.  Avoid bending over or lying down until 2-3 hours after eating.  Avoid peppermint and spearmint, caffeine, alcohol, and chocolate. This information is not intended to replace advice given to you by your health care provider. Make sure you discuss any questions you have with your health care provider. Document Revised: 08/19/2018 Document Reviewed: 06/03/2016 Elsevier Patient Education  2020 [employer].  I appreciate  the  opportunity to care for you  Thank You   Marsa Aris , MD

## 2020-01-20 ENCOUNTER — Encounter: Payer: Self-pay | Admitting: Gastroenterology

## 2020-01-30 ENCOUNTER — Other Ambulatory Visit: Payer: Self-pay | Admitting: Family Medicine

## 2020-02-08 ENCOUNTER — Encounter: Payer: Self-pay | Admitting: Internal Medicine

## 2020-02-09 ENCOUNTER — Other Ambulatory Visit: Payer: Self-pay | Admitting: Internal Medicine

## 2020-02-09 DIAGNOSIS — J324 Chronic pansinusitis: Secondary | ICD-10-CM

## 2020-02-10 ENCOUNTER — Other Ambulatory Visit: Payer: Self-pay

## 2020-02-10 ENCOUNTER — Ambulatory Visit (AMBULATORY_SURGERY_CENTER): Payer: BC Managed Care – PPO | Admitting: Gastroenterology

## 2020-02-10 ENCOUNTER — Encounter: Payer: Self-pay | Admitting: Gastroenterology

## 2020-02-10 VITALS — BP 113/82 | HR 63 | Temp 98.6°F | Resp 16 | Ht 67.0 in | Wt 132.0 lb

## 2020-02-10 DIAGNOSIS — K219 Gastro-esophageal reflux disease without esophagitis: Secondary | ICD-10-CM | POA: Diagnosis not present

## 2020-02-10 DIAGNOSIS — R110 Nausea: Secondary | ICD-10-CM | POA: Diagnosis not present

## 2020-02-10 MED ORDER — SODIUM CHLORIDE 0.9 % IV SOLN: 500.0000 mL | Freq: Once | INTRAVENOUS | Status: DC

## 2020-02-10 NOTE — Patient Instructions (Addendum)
YOU HAD AN ENDOSCOPIC PROCEDURE TODAY AT THE [facility]:   Refer to the procedure report that was given to you for any specific questions about what was found during the examination.  If the procedure report does not answer your questions, please call your gastroenterologist to clarify.  If you requested that your care partner not be given the details of your procedure findings, then the procedure report has been included in a sealed envelope for you to review at your convenience later.  YOU SHOULD EXPECT: Some feelings of bloating in the abdomen. Passage of more gas than usual.  Walking can help get rid of the air that was put into your GI tract during the procedure and reduce the bloating.  Please Note:  You might notice some irritation and congestion in your nose or some drainage.  This is from the oxygen used during your procedure.  There is no need for concern and it should clear up in a day or so.  SYMPTOMS TO REPORT IMMEDIATELY:  Following upper endoscopy (EGD)  Vomiting of blood or coffee ground material  New chest pain or pain under the shoulder blades  Painful or persistently difficult swallowing  New shortness of breath  Fever of 100F or higher  Black, tarry-looking stools  For urgent or emergent issues, a gastroenterologist can be reached at any hour by calling [phone]. Do not use MyChart messaging for urgent concerns.    DIET:  We do recommend a small meal at first, but then you may proceed to your regular diet.  Drink plenty of fluids but you should avoid alcoholic beverages for 24 hours.  ACTIVITY:  You should plan to take it easy for the rest of today and you should NOT DRIVE or use heavy machinery until tomorrow (because of the sedation medicines used during the test).    FOLLOW UP: Our staff will call the number listed on your records 48-72 hours following your procedure to check on you and address any questions or concerns that you may have regarding  the information given to you following your procedure. If we do not reach you, we will leave a message.  We will attempt to reach you two times.  During this call, we will ask if you have developed any symptoms of COVID 19. If you develop any symptoms (ie: fever, flu-like symptoms, shortness of breath, cough etc.) before then, please call [phone].  If you test positive for Covid 19 in the 2 weeks post procedure, please call and report this information to us.    SIGNATURES/CONFIDENTIALITY: You and/or your care partner have signed paperwork which will be entered into your electronic medical record.  These signatures attest to the fact that that the information above on your After Visit Summary has been reviewed and is understood.  Full responsibility of the confidentiality of this discharge information lies with you and/or your care-partner.  

## 2020-02-10 NOTE — Progress Notes (Signed)
Report to PACU, RN, vss, BBS= Clear.  

## 2020-02-10 NOTE — Op Note (Addendum)
[facility] Patient Name: Wayne Silva Procedure Date: 02/10/2020 3:55 PM MRN: [id number] Endoscopist: Napoleon Form , MD Age: 59 Referring MD:  Date of Birth: January 21, 1961 Gender: Male Account #: [id number] Procedure:                Upper GI endoscopy Indications:              Suspected reflux esophagitis, Esophageal reflux                            symptoms that recur despite appropriate therapy,                            nausea Medicines:                Monitored Anesthesia Care Procedure:                Pre-Anesthesia Assessment:                           - Prior to the procedure, a History and Physical                            was performed, and patient medications and                            allergies were reviewed. The patient's tolerance of                            previous anesthesia was also reviewed. The risks                            and benefits of the procedure and the sedation                            options and risks were discussed with the patient.                            All questions were answered, and informed consent                            was obtained. Prior Anticoagulants: The patient has                            taken no previous anticoagulant or antiplatelet                            agents. ASA Grade Assessment: II - A patient with                            mild systemic disease. After reviewing the risks                            and benefits, the patient was deemed in  satisfactory condition to undergo the procedure.                           After obtaining informed consent, the endoscope was                            passed under direct vision. Throughout the                            procedure, the patient's blood pressure, pulse, and                            oxygen saturations were monitored continuously. The                            Endoscope was introduced through the mouth, and                             advanced to the second part of duodenum. The upper                            GI endoscopy was accomplished without difficulty.                            The patient tolerated the procedure well. Scope In: Scope Out: Findings:                 The Z-line was regular and was found 38 cm from the                            incisors.                           The gastroesophageal flap valve was visualized                            endoscopically and classified as Hill Grade II                            (fold present, opens with respiration).                           No gross lesions were noted in the entire esophagus.                           The stomach was normal.                           The cardia and gastric fundus were normal on                            retroflexion.                           The examined duodenum was normal. Complications:  No immediate complications. Estimated Blood Loss:     Estimated blood loss: none. Impression:               - Z-line regular, 38 cm from the incisors.                           - Gastroesophageal flap valve classified as Hill                            Grade II (fold present, opens with respiration).                           - No gross lesions in esophagus.                           - Normal stomach.                           - Normal examined duodenum.                           - No specimens collected. Recommendation:           - Patient has a contact number available for                            emergencies. The signs and symptoms of potential                            delayed complications were discussed with the                            patient. Return to normal activities tomorrow.                            Written discharge instructions were provided to the                            patient.                           - Resume previous diet.                           - Continue present  medications.                           - Follow an antireflux regimen.                           - Return to GI office PRN. Napoleon Form, MD 02/10/2020 4:04:34 PM This report has been signed electronically.

## 2020-02-14 ENCOUNTER — Telehealth: Payer: Self-pay | Admitting: *Deleted

## 2020-02-14 ENCOUNTER — Telehealth: Payer: Self-pay

## 2020-02-14 DIAGNOSIS — L815 Leukoderma, not elsewhere classified: Secondary | ICD-10-CM | POA: Diagnosis not present

## 2020-02-14 DIAGNOSIS — D1801 Hemangioma of skin and subcutaneous tissue: Secondary | ICD-10-CM | POA: Diagnosis not present

## 2020-02-14 DIAGNOSIS — L570 Actinic keratosis: Secondary | ICD-10-CM | POA: Diagnosis not present

## 2020-02-14 DIAGNOSIS — D229 Melanocytic nevi, unspecified: Secondary | ICD-10-CM | POA: Diagnosis not present

## 2020-02-14 DIAGNOSIS — L821 Other seborrheic keratosis: Secondary | ICD-10-CM | POA: Diagnosis not present

## 2020-02-14 NOTE — Telephone Encounter (Signed)
Ok thank you 

## 2020-02-14 NOTE — Telephone Encounter (Signed)
  Follow up Call-  Call back number 02/10/2020  Post procedure Call Back phone  # [phone]  Permission to leave phone message Yes  Some recent data might be hidden     Patient questions:  Do you have a fever, pain , or abdominal swelling? No. Pain Score  0 *  Have you tolerated food without any problems? Yes.    Have you been able to return to your normal activities? Yes.    Do you have any questions about your discharge instructions: Diet   No. Medications  No. Follow up visit  Yes.    Do you have questions or concerns about your Care? Yes.     Patient states he is doing well. He was not sure if he should be coming back to see Dr. Lavon Paganini in her office for a follow-up or not. I pulled up his procedure report and went over it with him on the phone, EGD normal, no biopsies collected, recommendation per Dr. Lavon Paganini was to follow anti-reflux precautions and follow up with her prn. I went through anti-reflux precautions with patient as he stated he would review the handout given to him in recovery as well. Patient encouraged to call us back if he has any more questions and to follow-up with Dr. Lavon Paganini as needed. Pt. Verbalizes understanding and states no need to call him back at this time.  Actions: * If pain score is 4 or above: No action needed, pain <4. 1. Have you developed a fever since your procedure? no  2.   Have you had an respiratory symptoms (SOB or cough) since your procedure? no  3.   Have you tested positive for COVID 19 since your procedure no  4.   Have you had any family members/close contacts diagnosed with the COVID 19 since your procedure?  no   If yes to any of these questions please route to Laverna Peace, RN and Karlton Lemon, RN

## 2020-02-14 NOTE — Telephone Encounter (Signed)
First attempt, left VM.  

## 2020-02-14 NOTE — Telephone Encounter (Signed)
Second post procedure follow up call, no answer 

## 2020-02-22 ENCOUNTER — Other Ambulatory Visit: Payer: Self-pay

## 2020-02-22 ENCOUNTER — Encounter (INDEPENDENT_AMBULATORY_CARE_PROVIDER_SITE_OTHER): Payer: Self-pay | Admitting: Otolaryngology

## 2020-02-22 ENCOUNTER — Ambulatory Visit (INDEPENDENT_AMBULATORY_CARE_PROVIDER_SITE_OTHER): Payer: BC Managed Care – PPO | Admitting: Otolaryngology

## 2020-02-22 VITALS — Temp 97.3°F

## 2020-02-22 DIAGNOSIS — J342 Deviated nasal septum: Secondary | ICD-10-CM

## 2020-02-22 DIAGNOSIS — J310 Chronic rhinitis: Secondary | ICD-10-CM

## 2020-02-22 NOTE — Progress Notes (Signed)
HPI: Wayne Silva is a 59 y.o. male who presents is referred by Dr. Beaulah Dinning [facility] for evaluation of chronic nasal sinus complaints.  He complains of chronic trouble breathing through his nose and nasal congestion.  He does better when he uses Sudafed.  I had seen him previously back in March of this year and recommended regular use of Nasacort and azelastine which he has been using.Marland Kitchen  He describes congestion up within the nasal cavity.  Denies any yellow-green discharge from his nose no pain or pressure.  I reviewed a MRI scan he had performed 2016 that showed clear paranasal sinuses. Patient has history of asthma and is on an inhaler.  He is otherwise healthy with no history of cardiac disease hypertension or diabetes.  He does not smoke.  Past Medical History:  Diagnosis Date  . Alcoholic (HCC)   . Allergy   . Anxiety   . Asthma   . Depression   . Spastic bladder    Past Surgical History:  Procedure Laterality Date  . HEMORRHOID SURGERY    . KIDNEY STONE SURGERY     Social History   Socioeconomic History  . Marital status: Single    Spouse name: Not on file  . Number of children: Not on file  . Years of education: Not on file  . Highest education level: Not on file  Occupational History  . Not on file  Tobacco Use  . Smoking status: Former Smoker    Packs/day: 0.25    Years: 10.00    Pack years: 2.50    Types: Cigarettes    Quit date: 08/22/2004    Years since quitting: 15.5  . Smokeless tobacco: Never Used  Vaping Use  . Vaping Use: Never used  Substance and Sexual Activity  . Alcohol use: No    Alcohol/week: 0.0 standard drinks    Comment: was an alcoholic (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 ago as of 01/18/20)  . Drug use: No  . Sexual activity: Yes    Partners: Female    Birth control/protection: Condom  Other Topics Concern  . Not on file  Social History Narrative  . Not on file   Social Determinants of Health   Financial Resource Strain:   .  Difficulty of Paying Living Expenses: Not on file  Food Insecurity:   . Worried About Programme researcher, broadcasting/film/video in the Last Year: Not on file  . Ran Out of Food in the Last Year: Not on file  Transportation Needs:   . Lack of Transportation (Medical): Not on file  . Lack of Transportation (Non-Medical): Not on file  Physical Activity:   . Days of Exercise per Week: Not on file  . Minutes of Exercise per Session: Not on file  Stress:   . Feeling of Stress : Not on file  Social Connections:   . Frequency of Communication with Friends and Family: Not on file  . Frequency of Social Gatherings with Friends and Family: Not on file  . Attends Religious Services: Not on file  . Active Member of Clubs or Organizations: Not on file  . Attends Banker Meetings: Not on file  . Marital Status: Not on file   Family History  Problem Relation Age of Onset  . Skin cancer Mother   . Colon cancer Father        dx in his late 6s or early 8s  . Alzheimer's disease Father   . Pancreatic cancer Paternal  Grandfather   . Alzheimer's disease Maternal Grandmother   . Allergic rhinitis Neg Hx   . Angioedema Neg Hx   . Asthma Neg Hx   . Eczema Neg Hx   . Urticaria Neg Hx   . Immunodeficiency Neg Hx   . Rectal cancer Neg Hx   . Stomach cancer Neg Hx   . Esophageal cancer Neg Hx    No Known Allergies Prior to Admission medications   Medication Sig Start Date End Date Taking? Authorizing Provider  albuterol (PROAIR HFA) 108 (90 Base) MCG/ACT inhaler Inhale 2 puffs into the lungs every 6 (six) hours as needed for wheezing or shortness of breath.   Yes [provider]  azelastine (ASTELIN) 0.1 % nasal spray Place 2 sprays into both nostrils 2 (two) times daily. 07/14/19  Yes [provider]  CIALIS 20 MG tablet TAKE 1 TABLET BY ORAL ROUTE EVERY DAY 05/25/15  Yes [provider]  fluocinonide ointment (LIDEX) 0.05 % Apply 1 application topically 2 (two) times daily. 10/27/19   Yes [name], MD  fluticasone furoate-vilanterol (BREO ELLIPTA) 200-25 MCG/INH AEPB Inhale 1 puff into the lungs daily. 12/15/19  Yes [name], MD  levocetirizine (XYZAL) 5 MG tablet Take 1 tablet (5 mg total) by mouth every evening. 10/27/19  Yes [name], MD  mirtazapine (REMERON) 15 MG tablet Take 1 tablet (15 mg total) by mouth at bedtime. 12/15/19  Yes [name], MD  montelukast (SINGULAIR) 10 MG tablet TAKE 1 TABLET BY MOUTH EVERYDAY AT BEDTIME 01/05/20  Yes Ambs, Norvel Richards, FNP  Omega-3 Fatty Acids (FISH OIL PO) Take by mouth daily.    Yes [provider]  omeprazole (PRILOSEC) 40 MG capsule Take 1 capsule (40 mg total) by mouth daily. 01/18/20  Yes Nandigam, Eleonore Chiquito, MD  triamcinolone (NASACORT ALLERGY 24HR) 55 MCG/ACT AERO nasal inhaler Place 2 sprays into the nose daily.   Yes [provider]  valACYclovir (VALTREX) 1000 MG tablet Take 1 tablet by mouth daily. 07/03/14  Yes [provider]     Positive ROS: Otherwise negative  All other systems have been reviewed and were otherwise negative with the exception of those mentioned in the HPI and as above.  Physical Exam: Constitutional: Alert, well-appearing, no acute distress Ears: External ears without lesions or tenderness. Ear canals are clear bilaterally with intact, clear TMs.  Nasal: External nose without lesions. Septum is mildly deviated.Marland Kitchen  He has a very narrowed nasal valve region with slight collapse of the nasal valve on inhalation.  He has moderately large inferior turbinates and mucosal swelling within the nose but no polyps noted.  Middle meatus regions were clear. Oral: Lips and gums without lesions. Tongue and palate mucosa without lesions. Posterior oropharynx clear. Neck: No palpable adenopathy or masses Cardiac exam: Regular rate and rhythm without murmur Lungs: Clear to auscultation Respiratory: Breathing comfortably  Skin: No facial/neck lesions or rash  noted.  Procedures  Assessment: Chronic rhinitis with mild septal deviation and turbinate per trophy with narrowed nasal valve.  Plan: Would recommend regular use of nasal steroid spray at night as well as use of Astelin twice a day which should help some with congestion. Briefly discussed surgical options which would include septoplasty, turbinate reductions and nasal valve cartilage stents to support the nasal valve region. He will call us back if he decides to have surgical intervention.   Narda Bonds, MD   CC:

## 2020-03-01 DIAGNOSIS — J45909 Unspecified asthma, uncomplicated: Secondary | ICD-10-CM | POA: Diagnosis not present

## 2020-03-01 DIAGNOSIS — J342 Deviated nasal septum: Secondary | ICD-10-CM | POA: Diagnosis not present

## 2020-03-01 DIAGNOSIS — J343 Hypertrophy of nasal turbinates: Secondary | ICD-10-CM | POA: Insufficient documentation

## 2020-03-01 DIAGNOSIS — J3489 Other specified disorders of nose and nasal sinuses: Secondary | ICD-10-CM | POA: Diagnosis not present

## 2020-03-01 DIAGNOSIS — J310 Chronic rhinitis: Secondary | ICD-10-CM | POA: Diagnosis not present

## 2020-03-31 ENCOUNTER — Other Ambulatory Visit: Payer: Self-pay | Admitting: Internal Medicine

## 2020-03-31 DIAGNOSIS — J4520 Mild intermittent asthma, uncomplicated: Secondary | ICD-10-CM

## 2020-04-11 DIAGNOSIS — Z20822 Contact with and (suspected) exposure to covid-19: Secondary | ICD-10-CM | POA: Diagnosis not present

## 2020-05-11 DIAGNOSIS — R110 Nausea: Secondary | ICD-10-CM | POA: Diagnosis not present

## 2020-05-11 DIAGNOSIS — Z1152 Encounter for screening for COVID-19: Secondary | ICD-10-CM | POA: Diagnosis not present

## 2020-05-17 DIAGNOSIS — M950 Acquired deformity of nose: Secondary | ICD-10-CM | POA: Diagnosis not present

## 2020-05-17 DIAGNOSIS — J3489 Other specified disorders of nose and nasal sinuses: Secondary | ICD-10-CM | POA: Diagnosis not present

## 2020-05-27 ENCOUNTER — Other Ambulatory Visit: Payer: Self-pay | Admitting: Internal Medicine

## 2020-05-27 DIAGNOSIS — F32A Depression, unspecified: Secondary | ICD-10-CM

## 2020-05-27 DIAGNOSIS — F329 Major depressive disorder, single episode, unspecified: Secondary | ICD-10-CM

## 2020-05-28 ENCOUNTER — Other Ambulatory Visit (INDEPENDENT_AMBULATORY_CARE_PROVIDER_SITE_OTHER): Payer: Self-pay | Admitting: Otolaryngology

## 2020-06-20 ENCOUNTER — Other Ambulatory Visit (INDEPENDENT_AMBULATORY_CARE_PROVIDER_SITE_OTHER): Payer: Self-pay | Admitting: Otolaryngology

## 2020-07-03 DIAGNOSIS — R3589 Other polyuria: Secondary | ICD-10-CM | POA: Diagnosis not present

## 2020-07-03 DIAGNOSIS — N138 Other obstructive and reflux uropathy: Secondary | ICD-10-CM | POA: Diagnosis not present

## 2020-07-03 DIAGNOSIS — N401 Enlarged prostate with lower urinary tract symptoms: Secondary | ICD-10-CM | POA: Diagnosis not present

## 2020-07-03 DIAGNOSIS — N3010 Interstitial cystitis (chronic) without hematuria: Secondary | ICD-10-CM | POA: Diagnosis not present

## 2020-08-18 ENCOUNTER — Other Ambulatory Visit: Payer: Self-pay | Admitting: Family Medicine

## 2020-09-15 ENCOUNTER — Other Ambulatory Visit: Payer: Self-pay | Admitting: Family Medicine

## 2020-09-19 ENCOUNTER — Other Ambulatory Visit: Payer: Self-pay

## 2020-09-19 ENCOUNTER — Other Ambulatory Visit: Payer: Self-pay | Admitting: Family Medicine

## 2020-09-19 NOTE — Telephone Encounter (Signed)
Declined montelukast 10 mg. Last office visit was 07/2019. Patient needs office visit.

## 2020-10-12 ENCOUNTER — Other Ambulatory Visit: Payer: Self-pay

## 2020-10-12 ENCOUNTER — Ambulatory Visit (INDEPENDENT_AMBULATORY_CARE_PROVIDER_SITE_OTHER): Payer: BC Managed Care – PPO | Admitting: Internal Medicine

## 2020-10-12 ENCOUNTER — Encounter: Payer: Self-pay | Admitting: Internal Medicine

## 2020-10-12 VITALS — BP 98/64 | HR 69 | Temp 97.9°F | Ht 67.0 in | Wt 129.0 lb

## 2020-10-12 DIAGNOSIS — F411 Generalized anxiety disorder: Secondary | ICD-10-CM | POA: Diagnosis not present

## 2020-10-12 DIAGNOSIS — K219 Gastro-esophageal reflux disease without esophagitis: Secondary | ICD-10-CM | POA: Diagnosis not present

## 2020-10-12 DIAGNOSIS — Z23 Encounter for immunization: Secondary | ICD-10-CM

## 2020-10-12 DIAGNOSIS — K6289 Other specified diseases of anus and rectum: Secondary | ICD-10-CM | POA: Insufficient documentation

## 2020-10-12 MED ORDER — HYDROCORTISONE ACETATE 25 MG RE SUPP: 25.0000 mg | Freq: Two times a day (BID) | RECTAL | 2 refills | Status: DC

## 2020-10-12 NOTE — Assessment & Plan Note (Signed)
Has external skin tags x 2 but no acute thrombosed hemorrhoid; cant r/o fissure or internal hemorrhoid vs other  - for anusol HC supp and colace bid, also refer GI - Dr Leone Payor

## 2020-10-12 NOTE — Progress Notes (Signed)
Patient ID: Wayne Silva, male   DOB: [birth year], 60 y.o.   MRN: [id number]        Chief Complaint: anal pain       HPI:  Wayne Silva is a 60 y.o. male here with above x 2 mo, constant, moderate, dull and burning, without radiation, and Denies worsening reflux, abd pain, dysphagia, n/v, bowel change or blood. Has remote hx of what sounds like grade 3 or 4 internal hemorrhoid which per pt did not go well at all.  Takes fiber supplement daily but not helping with the anal pain.  Nothing else seems to make better or worse.  Due for shingrix and pt is accepting.  Denies worsening depressive symptoms, suicidal ideation, or panic; has ongoing anxiety.         Wt Readings from Last 3 Encounters:  10/12/20 129 lb (58.5 kg)  02/10/20 132 lb (59.9 kg)  01/18/20 132 lb (59.9 kg)   BP Readings from Last 3 Encounters:  10/12/20 98/64  02/10/20 113/82  01/18/20 (!) 88/52         Past Medical History:  Diagnosis Date  . Alcoholic (HCC)   . Allergy   . Anxiety   . Asthma   . Depression   . Spastic bladder    Past Surgical History:  Procedure Laterality Date  . HEMORRHOID SURGERY    . KIDNEY STONE SURGERY      reports that he quit smoking about 16 years ago. His smoking use included cigarettes. He has a 2.50 pack-year smoking history. He has never used smokeless tobacco. He reports that he does not drink alcohol and does not use drugs. family history includes Alzheimer's disease in his father and maternal grandmother; Colon cancer in his father; Pancreatic cancer in his paternal grandfather; Skin cancer in his mother. No Known Allergies Current Outpatient Medications on File Prior to Visit  Medication Sig Dispense Refill  . albuterol (VENTOLIN HFA) 108 (90 Base) MCG/ACT inhaler Inhale 2 puffs into the lungs every 6 (six) hours as needed for wheezing or shortness of breath.    Marland Kitchen azelastine (ASTELIN) 0.1 % nasal spray SPRAY 1 SPRAY INTO EACH NOSTRIL TWICE A DAY 20 mL 2  . BREO ELLIPTA 200-25  MCG/INH AEPB TAKE 1 PUFF BY MOUTH EVERY DAY 180 each 1  . CIALIS 20 MG tablet TAKE 1 TABLET BY ORAL ROUTE EVERY DAY  10  . fluocinonide ointment (LIDEX) 0.05 % Apply 1 application topically 2 (two) times daily. 60 g 0  . mirtazapine (REMERON) 15 MG tablet TAKE 1 TABLET BY MOUTH EVERYDAY AT BEDTIME 90 tablet 1  . montelukast (SINGULAIR) 10 MG tablet TAKE 1 TABLET BY MOUTH EVERYDAY AT BEDTIME 30 tablet 0  . Omega-3 Fatty Acids (FISH OIL PO) Take by mouth daily.     Marland Kitchen omeprazole (PRILOSEC) 40 MG capsule Take 1 capsule (40 mg total) by mouth daily. 30 capsule 6  . triamcinolone (NASACORT) 55 MCG/ACT AERO nasal inhaler Place 2 sprays into the nose daily.    . valACYclovir (VALTREX) 1000 MG tablet Take 1 tablet by mouth daily.    Marland Kitchen levocetirizine (XYZAL) 5 MG tablet Take 1 tablet (5 mg total) by mouth every evening. (Patient not taking: Reported on 10/12/2020) 90 tablet 1   No current facility-administered medications on file prior to visit.        ROS:  All others reviewed and negative.  Objective        PE:  BP 98/64   Pulse 69  Temp 97.9 F (36.6 C) (Oral)   Ht 5\' 7"  (1.702 m)   Wt 129 lb (58.5 kg)   SpO2 98%   BMI 20.20 kg/m                 Constitutional: Pt appears in NAD               HENT: Head: NCAT.                Right Ear: External ear normal.                 Left Ear: External ear normal.                Eyes: . Pupils are equal, round, and reactive to light. Conjunctivae and EOM are normal               Nose: without d/c or deformity               Neck: Neck supple. Gross normal ROM               Cardiovascular: Normal rate and regular rhythm.                 Pulmonary/Chest: Effort normal and breath sounds without rales or wheezing.                Abd:  Soft, NT, ND, + BS, no organomegaly; DRE with anal large skin tag x 2 but no evidence for thrombosed ext hemorrhoid               Neurological: Pt is alert. At baseline orientation, motor grossly intact               Skin:  Skin is warm. No rashes, no other new lesions, LE edema - none               Psychiatric: Pt behavior is normal without agitation   Micro: none  Cardiac tracings I have personally interpreted today:  none  Pertinent Radiological findings (summarize): none   Lab Results  Component Value Date   WBC 7.9 10/20/2019   HGB 14.8 10/20/2019   HCT 44.0 10/20/2019   PLT 156.0 10/20/2019   GLUCOSE 111 (H) 10/20/2019   CHOL 156 12/15/2019   TRIG 62 12/15/2019   HDL 68 12/15/2019   LDLCALC 74 12/15/2019   ALT 31 10/20/2019   AST 23 10/20/2019   NA 135 10/20/2019   K 4.0 10/20/2019   CL 99 10/20/2019   CREATININE 0.87 10/20/2019   BUN 32 (H) 10/20/2019   CO2 31 10/20/2019   TSH 2.11 10/20/2019   PSA 1.13 10/20/2019   HGBA1C 5.0 10/20/2019   Assessment/Plan:  Wayne Silva is a 60 y.o. White or Caucasian [1] male with  has a past medical history of Alcoholic (HCC), Allergy, Anxiety, Asthma, Depression, and Spastic bladder.  Anal pain Has external skin tags x 2 but no acute thrombosed hemorrhoid; cant r/o fissure or internal hemorrhoid vs other  - for anusol HC supp and colace bid, also refer GI - Dr 46  GAD (generalized anxiety disorder) Stable, pt reassured, declines need for change in tx or referrals  Gastroesophageal reflux disease without esophagitis Stable on prilosec 40 qd,  to f/u any worsening symptoms or concerns   Followup: Return if symptoms worsen or fail to improve.  Leone Payor, MD 10/12/2020 2:21 PM [facility]

## 2020-10-12 NOTE — Addendum Note (Signed)
Addended by: Manuela Schwartz on: 10/12/2020 02:37 PM   Modules accepted: Orders

## 2020-10-12 NOTE — Patient Instructions (Addendum)
Please take all new medication as prescribed - the suppository as needed  You will be contacted regarding the referral for: Dr Brennan Bailey  Also, please take OTC Colace 100 mg twice per day as needed (or less if you have too loose stools or diarrhea)  Please continue all other medications as before, and refills have been done if requested.  Please have the pharmacy call with any other refills you may need.  Please keep your appointments with your specialists as you may have planned

## 2020-10-12 NOTE — Assessment & Plan Note (Signed)
Stable, pt reassured, declines need for change in tx or referrals

## 2020-10-12 NOTE — Assessment & Plan Note (Signed)
Stable on prilosec 40 qd,  to f/u any worsening symptoms or concerns

## 2020-12-21 ENCOUNTER — Other Ambulatory Visit (INDEPENDENT_AMBULATORY_CARE_PROVIDER_SITE_OTHER): Payer: Self-pay | Admitting: Otolaryngology

## 2020-12-30 ENCOUNTER — Other Ambulatory Visit: Payer: Self-pay | Admitting: Internal Medicine

## 2020-12-30 DIAGNOSIS — F329 Major depressive disorder, single episode, unspecified: Secondary | ICD-10-CM

## 2020-12-30 DIAGNOSIS — F32A Depression, unspecified: Secondary | ICD-10-CM

## 2021-01-03 ENCOUNTER — Other Ambulatory Visit: Payer: Self-pay | Admitting: Internal Medicine

## 2021-01-03 DIAGNOSIS — F32A Depression, unspecified: Secondary | ICD-10-CM

## 2021-01-03 DIAGNOSIS — F329 Major depressive disorder, single episode, unspecified: Secondary | ICD-10-CM

## 2021-01-09 ENCOUNTER — Encounter: Payer: Self-pay | Admitting: Internal Medicine

## 2021-01-09 ENCOUNTER — Other Ambulatory Visit: Payer: Self-pay

## 2021-01-09 ENCOUNTER — Ambulatory Visit (INDEPENDENT_AMBULATORY_CARE_PROVIDER_SITE_OTHER): Payer: BC Managed Care – PPO

## 2021-01-09 ENCOUNTER — Ambulatory Visit (INDEPENDENT_AMBULATORY_CARE_PROVIDER_SITE_OTHER): Payer: BC Managed Care – PPO | Admitting: Internal Medicine

## 2021-01-09 VITALS — BP 102/60 | HR 64 | Temp 98.7°F | Ht 67.0 in | Wt 125.0 lb

## 2021-01-09 DIAGNOSIS — Z23 Encounter for immunization: Secondary | ICD-10-CM | POA: Diagnosis not present

## 2021-01-09 DIAGNOSIS — Z125 Encounter for screening for malignant neoplasm of prostate: Secondary | ICD-10-CM

## 2021-01-09 DIAGNOSIS — R059 Cough, unspecified: Secondary | ICD-10-CM | POA: Diagnosis not present

## 2021-01-09 DIAGNOSIS — Z0000 Encounter for general adult medical examination without abnormal findings: Secondary | ICD-10-CM | POA: Diagnosis not present

## 2021-01-09 DIAGNOSIS — K219 Gastro-esophageal reflux disease without esophagitis: Secondary | ICD-10-CM | POA: Diagnosis not present

## 2021-01-09 DIAGNOSIS — F32A Depression, unspecified: Secondary | ICD-10-CM

## 2021-01-09 DIAGNOSIS — F450 Somatization disorder: Secondary | ICD-10-CM

## 2021-01-09 DIAGNOSIS — F329 Major depressive disorder, single episode, unspecified: Secondary | ICD-10-CM | POA: Diagnosis not present

## 2021-01-09 DIAGNOSIS — U071 COVID-19: Secondary | ICD-10-CM | POA: Diagnosis not present

## 2021-01-09 DIAGNOSIS — R739 Hyperglycemia, unspecified: Secondary | ICD-10-CM

## 2021-01-09 DIAGNOSIS — F411 Generalized anxiety disorder: Secondary | ICD-10-CM

## 2021-01-09 LAB — CBC WITH DIFFERENTIAL/PLATELET
Basophils Absolute: 0 10*3/uL (ref 0.0–0.1)
Basophils Relative: 0.9 % (ref 0.0–3.0)
Eosinophils Absolute: 0.2 10*3/uL (ref 0.0–0.7)
Eosinophils Relative: 2.7 % (ref 0.0–5.0)
HCT: 43.5 % (ref 39.0–52.0)
Hemoglobin: 14.8 g/dL (ref 13.0–17.0)
Lymphocytes Relative: 16.6 % (ref 12.0–46.0)
Lymphs Abs: 0.9 10*3/uL (ref 0.7–4.0)
MCHC: 33.9 g/dL (ref 30.0–36.0)
MCV: 91.9 fl (ref 78.0–100.0)
Monocytes Absolute: 0.7 10*3/uL (ref 0.1–1.0)
Monocytes Relative: 12.3 % — ABNORMAL HIGH (ref 3.0–12.0)
Neutro Abs: 3.8 10*3/uL (ref 1.4–7.7)
Neutrophils Relative %: 67.5 % (ref 43.0–77.0)
Platelets: 143 10*3/uL — ABNORMAL LOW (ref 150.0–400.0)
RBC: 4.73 Mil/uL (ref 4.22–5.81)
RDW: 13.6 % (ref 11.5–15.5)
WBC: 5.6 10*3/uL (ref 4.0–10.5)

## 2021-01-09 LAB — BASIC METABOLIC PANEL
BUN: 30 mg/dL — ABNORMAL HIGH (ref 6–23)
CO2: 32 mEq/L (ref 19–32)
Calcium: 9.6 mg/dL (ref 8.4–10.5)
Chloride: 98 mEq/L (ref 96–112)
Creatinine, Ser: 0.99 mg/dL (ref 0.40–1.50)
GFR: 83.42 mL/min (ref >60.00)
Glucose, Bld: 81 mg/dL (ref 70–99)
Potassium: 4.5 mEq/L (ref 3.5–5.1)
Sodium: 137 mEq/L (ref 135–145)

## 2021-01-09 LAB — LIPID PANEL
Cholesterol: 213 mg/dL — ABNORMAL HIGH (ref 0–200)
HDL: 82.3 mg/dL (ref >39.00)
LDL Cholesterol: 110 mg/dL — ABNORMAL HIGH (ref 0–99)
NonHDL: 131.03
Total CHOL/HDL Ratio: 3
Triglycerides: 106 mg/dL (ref 0.0–149.0)
VLDL: 21.2 mg/dL (ref 0.0–40.0)

## 2021-01-09 LAB — PSA: PSA: 1.92 ng/mL (ref 0.10–4.00)

## 2021-01-09 LAB — HEMOGLOBIN A1C: Hgb A1c MFr Bld: 5.7 % (ref 4.6–6.5)

## 2021-01-09 MED ORDER — MIRTAZAPINE 15 MG PO TABS: ORAL_TABLET | ORAL | 0 refills | Status: DC

## 2021-01-09 NOTE — Progress Notes (Signed)
Subjective:  Patient ID: Wayne Silva, male    DOB: May 04, 1961  Age: 60 y.o. MRN: [id number]  CC: Annual Exam, Cough, Asthma, and Depression  This visit occurred during the SARS-CoV-2 public health emergency.  Safety protocols were in place, including screening questions prior to the visit, additional usage of staff PPE, and extensive cleaning of exam room while observing appropriate contact time as indicated for disinfecting solutions.    HPI Xaine Sansom presents for a CPX and f/up -  He complains of a 3-day history of nonproductive cough and sore throat.  He denies fever, chills, night sweats, chest pain, hemoptysis, shortness of breath, or wheezing.  He recently tried to stop taking mirtazapine and it did not go well so he restarted it and is doing well on the current dosage.  He needs a refill.  Outpatient Medications Prior to Visit  Medication Sig Dispense Refill   albuterol (VENTOLIN HFA) 108 (90 Base) MCG/ACT inhaler Inhale 2 puffs into the lungs every 6 (six) hours as needed for wheezing or shortness of breath.     azelastine (ASTELIN) 0.1 % nasal spray SPRAY 1 SPRAY INTO EACH NOSTRIL TWICE A DAY 20 mL 2   BREO ELLIPTA 200-25 MCG/INH AEPB TAKE 1 PUFF BY MOUTH EVERY DAY 180 each 1   CIALIS 20 MG tablet TAKE 1 TABLET BY ORAL ROUTE EVERY DAY  10   fluocinonide ointment (LIDEX) 0.05 % Apply 1 application topically 2 (two) times daily. 60 g 0   hydrocortisone (ANUSOL-HC) 25 MG suppository Place 1 suppository (25 mg total) rectally every 12 (twelve) hours. 12 suppository 2   levocetirizine (XYZAL) 5 MG tablet Take 1 tablet (5 mg total) by mouth every evening. 90 tablet 1   montelukast (SINGULAIR) 10 MG tablet TAKE 1 TABLET BY MOUTH EVERYDAY AT BEDTIME 30 tablet 0   Omega-3 Fatty Acids (FISH OIL PO) Take by mouth daily.      omeprazole (PRILOSEC) 40 MG capsule Take 1 capsule (40 mg total) by mouth daily. 30 capsule 6   triamcinolone (NASACORT) 55 MCG/ACT AERO nasal inhaler Place 2 sprays  into the nose daily.     valACYclovir (VALTREX) 1000 MG tablet Take 1 tablet by mouth daily.     mirtazapine (REMERON) 15 MG tablet TAKE 1 TABLET BY MOUTH EVERYDAY AT BEDTIME 90 tablet 1   No facility-administered medications prior to visit.    ROS Review of Systems  Constitutional:  Negative for appetite change, chills, diaphoresis, fatigue and fever.  HENT:  Positive for sore throat. Negative for facial swelling, sinus pressure and trouble swallowing.   Eyes: Negative.   Respiratory:  Positive for cough. Negative for shortness of breath and wheezing.   Cardiovascular:  Negative for chest pain, palpitations and leg swelling.  Gastrointestinal:  Negative for abdominal pain, constipation, diarrhea, nausea and vomiting.  Genitourinary: Negative.  Negative for difficulty urinating.  Musculoskeletal: Negative.  Negative for arthralgias and myalgias.  Skin: Negative.  Negative for color change and rash.  Neurological: Negative.  Negative for dizziness, weakness, light-headedness, numbness and headaches.  Hematological:  Negative for adenopathy. Does not bruise/bleed easily.  Psychiatric/Behavioral:  The patient is nervous/anxious.    Objective:  BP 102/60 (BP Location: Left Arm, Patient Position: Sitting, Cuff Size: Large)   Pulse 64   Temp 98.7 F (37.1 C) (Oral)   Ht 5\' 7"  (1.702 m)   Wt 125 lb (56.7 kg)   SpO2 95%   BMI 19.58 kg/m   BP Readings from Last 3  Encounters:  01/09/21 102/60  10/12/20 98/64  02/10/20 113/82    Wt Readings from Last 3 Encounters:  01/09/21 125 lb (56.7 kg)  10/12/20 129 lb (58.5 kg)  02/10/20 132 lb (59.9 kg)    Physical Exam Vitals reviewed.  Constitutional:      Appearance: Normal appearance.  HENT:     Nose: Nose normal.     Mouth/Throat:     Mouth: Mucous membranes are moist.  Eyes:     General: No scleral icterus.    Conjunctiva/sclera: Conjunctivae normal.  Cardiovascular:     Rate and Rhythm: Normal rate and regular rhythm.      Heart sounds: No murmur heard. Pulmonary:     Effort: No tachypnea or accessory muscle usage.     Breath sounds: Examination of the right-lower field reveals rhonchi. Rhonchi present. No decreased breath sounds, wheezing or rales.  Abdominal:     General: Abdomen is flat.     Palpations: There is no mass.     Tenderness: There is no abdominal tenderness. There is no guarding.     Hernia: No hernia is present.  Musculoskeletal:        General: Normal range of motion.     Cervical back: Neck supple.     Right lower leg: No edema.     Left lower leg: No edema.  Lymphadenopathy:     Cervical: No cervical adenopathy.  Skin:    General: Skin is warm and dry.  Neurological:     General: No focal deficit present.     Mental Status: He is alert.    Lab Results  Component Value Date   WBC 5.6 01/09/2021   HGB 14.8 01/09/2021   HCT 43.5 01/09/2021   PLT 143.0 (L) 01/09/2021   GLUCOSE 81 01/09/2021   CHOL 213 (H) 01/09/2021   TRIG 106.0 01/09/2021   HDL 82.30 01/09/2021   LDLCALC 110 (H) 01/09/2021   ALT 31 10/20/2019   AST 23 10/20/2019   NA 137 01/09/2021   K 4.5 01/09/2021   CL 98 01/09/2021   CREATININE 0.99 01/09/2021   BUN 30 (H) 01/09/2021   CO2 32 01/09/2021   TSH 2.11 10/20/2019   PSA 1.92 01/09/2021   HGBA1C 5.7 01/09/2021    DG Chest 2 View  Result Date: 06/08/2018 CLINICAL DATA:  Cough and wheezing for 3 days.  Former smoker. EXAM: CHEST - 2 VIEW COMPARISON:  05/01/2014 FINDINGS: The heart size and mediastinal contours are within normal limits. Both lungs are clear. The visualized skeletal structures are unremarkable. IMPRESSION: No active cardiopulmonary disease. Electronically Signed   By: Myles Rosenthal M.D.   On: 06/08/2018 08:25   DG Chest 2 View  Result Date: 01/09/2021 CLINICAL DATA:  Cough, cold for a month. EXAM: CHEST - 2 VIEW COMPARISON:  June 07, 2018. FINDINGS: Trachea midline. Cardiomediastinal contours and hilar structures are normal. Lungs are  clear. The lateral aspect of the RIGHT costodiaphragmatic sulcus is excluded from view. No sign of pleural effusion. On limited assessment no acute skeletal process. IMPRESSION: No active cardiopulmonary disease. Electronically Signed   By: Donzetta Kohut M.D.   On: 01/09/2021 15:29     Assessment & Plan:   Latavion was seen today for annual exam, cough, asthma and depression.  Diagnoses and all orders for this visit:  Gastroesophageal reflux disease without esophagitis- His symptoms are well controlled. -     CBC with Differential/Platelet; Future -     CBC with Differential/Platelet  Hyperglycemia- His A1c is normal. -     Hemoglobin A1c; Future -     Basic metabolic panel; Future -     Basic metabolic panel -     Hemoglobin A1c  Routine general medical examination at a health care facility- Exam completed, labs reviewed, vaccines reviewed and updated, patient education was given. -     Lipid panel; Future -     PSA; Future -     PSA -     Lipid panel  Depression with somatization -     mirtazapine (REMERON) 15 MG tablet; TAKE 1 TABLET BY MOUTH EVERYDAY AT BEDTIME  Cough- His chest x-ray is negative for mass or infiltrate.  His COVID antibody test is positive but the antigen is negative.  He will let me know if he develops any worsening symptoms and we will consider starting an antiviral if needed. -     DG Chest 2 View; Future -     Novel Coronavirus, NAA ([facility]); Future -     Novel Coronavirus, NAA ([facility]) -     SARS-COV-2 IgG; Future  GAD (generalized anxiety disorder) -     mirtazapine (REMERON) 15 MG tablet; TAKE 1 TABLET BY MOUTH EVERYDAY AT BEDTIME  Other orders -     Flu Vaccine QUAD 6+ mos PF IM (Fluarix Quad PF) -     SARS-COV-2, NAA 2 DAY TAT  I am having [name] maintain his valACYclovir, Omega-3 Fatty Acids (FISH OIL PO), Cialis, albuterol, triamcinolone, fluocinonide ointment, levocetirizine, omeprazole, Breo Ellipta, azelastine, montelukast,  hydrocortisone, and mirtazapine.  Meds ordered this encounter  Medications   mirtazapine (REMERON) 15 MG tablet    Sig: TAKE 1 TABLET BY MOUTH EVERYDAY AT BEDTIME    Dispense:  90 tablet    Refill:  0      Follow-up: No follow-ups on file.  Sanda Linger, MD

## 2021-01-10 ENCOUNTER — Other Ambulatory Visit: Payer: BC Managed Care – PPO

## 2021-01-10 DIAGNOSIS — R059 Cough, unspecified: Secondary | ICD-10-CM

## 2021-01-10 LAB — NOVEL CORONAVIRUS, NAA: SARS-CoV-2, NAA: NOT DETECTED

## 2021-01-10 LAB — SARS-COV-2 IGG: SARS-COV-2 IgG: 40.11

## 2021-01-10 LAB — SARS-COV-2, NAA 2 DAY TAT

## 2021-01-11 DIAGNOSIS — U071 COVID-19: Secondary | ICD-10-CM | POA: Insufficient documentation

## 2021-01-15 ENCOUNTER — Telehealth: Payer: Self-pay | Admitting: Internal Medicine

## 2021-01-15 DIAGNOSIS — J4520 Mild intermittent asthma, uncomplicated: Secondary | ICD-10-CM

## 2021-01-15 MED ORDER — FLUTICASONE FUROATE-VILANTEROL 200-25 MCG/INH IN AEPB: INHALATION_SPRAY | RESPIRATORY_TRACT | 1 refills | Status: DC

## 2021-01-15 MED ORDER — HYDROCODONE BIT-HOMATROP MBR 5-1.5 MG/5ML PO SOLN: 5.0000 mL | Freq: Three times a day (TID) | ORAL | 0 refills | Status: DC | PRN

## 2021-01-15 MED ORDER — BENZONATATE 200 MG PO CAPS: 200.0000 mg | ORAL_CAPSULE | Freq: Three times a day (TID) | ORAL | 1 refills | Status: DC | PRN

## 2021-01-15 NOTE — Telephone Encounter (Signed)
1.Medication Requested: BREO ELLIPTA 200-25 MCG/INH AEPB  2. Pharmacy (Name, Street, [place]): [id number] - [place], [place] - [phone] Wayne Silva  Phone:  [phone] Fax:  [phone]   3. On Med List: yes  4. Last Visit with PCP: 08.31.22  5. Next visit date with PCP: n/a  **Patient says provider asked him if he needed something to help w/ congestion & cough & patient says he does.Marland Kitchen would like something for this sent to the pharmacy as well**   Agent: Please be advised that RX refills may take up to 3 business days. We ask that you follow-up with your pharmacy.

## 2021-01-15 NOTE — Telephone Encounter (Signed)
I am not sure what his PCP was thinking for the congestion and cough.  When he saw him he did mention possibly an antiviral if his symptoms did not improve, but he is out of the window for that so we can no longer do the antiviral.  He can take Sudafed since he does not have a history of hypertension.  He can also take Mucinex.  Either of those will help with the congestion.  I can send in Tessalon Perles-nondrowsy cough pills for his cough or something stronger at night if needed-let me know

## 2021-01-15 NOTE — Addendum Note (Signed)
Addended by: Pincus Sanes on: 01/15/2021 03:34 PM   Modules accepted: Orders

## 2021-01-15 NOTE — Telephone Encounter (Signed)
Pt has been informed. He stated that he is currently taking Mucinex.  He would like to do both cough meds if allowed because he expressed that he has night time coughing as well as during the day. However, he said if he is not able to do both he would prefer the stronger cough med to take at night. Please advise.

## 2021-02-12 DIAGNOSIS — L821 Other seborrheic keratosis: Secondary | ICD-10-CM | POA: Diagnosis not present

## 2021-02-12 DIAGNOSIS — D2239 Melanocytic nevi of other parts of face: Secondary | ICD-10-CM | POA: Diagnosis not present

## 2021-02-12 DIAGNOSIS — L815 Leukoderma, not elsewhere classified: Secondary | ICD-10-CM | POA: Diagnosis not present

## 2021-02-12 DIAGNOSIS — L820 Inflamed seborrheic keratosis: Secondary | ICD-10-CM | POA: Diagnosis not present

## 2021-02-12 DIAGNOSIS — B009 Herpesviral infection, unspecified: Secondary | ICD-10-CM | POA: Diagnosis not present

## 2021-03-19 ENCOUNTER — Other Ambulatory Visit: Payer: Self-pay | Admitting: Internal Medicine

## 2021-03-19 ENCOUNTER — Telehealth: Payer: Self-pay | Admitting: Internal Medicine

## 2021-03-19 DIAGNOSIS — N401 Enlarged prostate with lower urinary tract symptoms: Secondary | ICD-10-CM

## 2021-03-19 DIAGNOSIS — N3010 Interstitial cystitis (chronic) without hematuria: Secondary | ICD-10-CM

## 2021-03-19 NOTE — Telephone Encounter (Signed)
Patient calling in  Patient had urology referral placed 2020.Marland Kitchen patient is requesting new referral be submitted to same location [facility] for irritable bladder  Please let patient know when referral has been placed or if OV is needed 1st  Call [phone]

## 2021-03-21 ENCOUNTER — Encounter: Payer: Self-pay | Admitting: Internal Medicine

## 2021-04-05 ENCOUNTER — Other Ambulatory Visit: Payer: Self-pay | Admitting: Internal Medicine

## 2021-04-05 DIAGNOSIS — F411 Generalized anxiety disorder: Secondary | ICD-10-CM

## 2021-04-05 DIAGNOSIS — F32A Depression, unspecified: Secondary | ICD-10-CM

## 2021-04-22 ENCOUNTER — Encounter: Payer: BC Managed Care – PPO | Admitting: Internal Medicine

## 2021-04-30 ENCOUNTER — Other Ambulatory Visit: Payer: Self-pay

## 2021-04-30 ENCOUNTER — Ambulatory Visit (INDEPENDENT_AMBULATORY_CARE_PROVIDER_SITE_OTHER): Payer: BC Managed Care – PPO | Admitting: Internal Medicine

## 2021-04-30 ENCOUNTER — Other Ambulatory Visit: Payer: Self-pay | Admitting: Internal Medicine

## 2021-04-30 ENCOUNTER — Encounter: Payer: Self-pay | Admitting: Internal Medicine

## 2021-04-30 VITALS — BP 102/74 | HR 64 | Temp 97.9°F | Ht 67.0 in | Wt 133.0 lb

## 2021-04-30 DIAGNOSIS — J4520 Mild intermittent asthma, uncomplicated: Secondary | ICD-10-CM

## 2021-04-30 DIAGNOSIS — D696 Thrombocytopenia, unspecified: Secondary | ICD-10-CM | POA: Insufficient documentation

## 2021-04-30 DIAGNOSIS — K5904 Chronic idiopathic constipation: Secondary | ICD-10-CM | POA: Diagnosis not present

## 2021-04-30 DIAGNOSIS — K648 Other hemorrhoids: Secondary | ICD-10-CM | POA: Insufficient documentation

## 2021-04-30 DIAGNOSIS — Z23 Encounter for immunization: Secondary | ICD-10-CM | POA: Diagnosis not present

## 2021-04-30 LAB — CBC WITH DIFFERENTIAL/PLATELET
Basophils Absolute: 0.1 10*3/uL (ref 0.0–0.1)
Basophils Relative: 1 % (ref 0.0–3.0)
Eosinophils Absolute: 0.1 10*3/uL (ref 0.0–0.7)
Eosinophils Relative: 2 % (ref 0.0–5.0)
HCT: 45.2 % (ref 39.0–52.0)
Hemoglobin: 14.8 g/dL (ref 13.0–17.0)
Lymphocytes Relative: 19.6 % (ref 12.0–46.0)
Lymphs Abs: 1.3 10*3/uL (ref 0.7–4.0)
MCHC: 32.7 g/dL (ref 30.0–36.0)
MCV: 93.9 fl (ref 78.0–100.0)
Monocytes Absolute: 0.5 10*3/uL (ref 0.1–1.0)
Monocytes Relative: 7 % (ref 3.0–12.0)
Neutro Abs: 4.6 10*3/uL (ref 1.4–7.7)
Neutrophils Relative %: 70.4 % (ref 43.0–77.0)
Platelets: 174 10*3/uL (ref 150.0–400.0)
RBC: 4.81 Mil/uL (ref 4.22–5.81)
RDW: 13.2 % (ref 11.5–15.5)
WBC: 6.6 10*3/uL (ref 4.0–10.5)

## 2021-04-30 LAB — BASIC METABOLIC PANEL
BUN: 26 mg/dL — ABNORMAL HIGH (ref 6–23)
CO2: 33 mEq/L — ABNORMAL HIGH (ref 19–32)
Calcium: 9.4 mg/dL (ref 8.4–10.5)
Chloride: 100 mEq/L (ref 96–112)
Creatinine, Ser: 1 mg/dL (ref 0.40–1.50)
GFR: 82.25 mL/min (ref >60.00)
Glucose, Bld: 87 mg/dL (ref 70–99)
Potassium: 3.8 mEq/L (ref 3.5–5.1)
Sodium: 138 mEq/L (ref 135–145)

## 2021-04-30 LAB — FOLATE: Folate: >23.4 ng/mL (ref >5.9)

## 2021-04-30 LAB — TSH: TSH: 2.5 u[IU]/mL (ref 0.35–5.50)

## 2021-04-30 LAB — VITAMIN B12: Vitamin B-12: 1165 pg/mL — ABNORMAL HIGH (ref 211–911)

## 2021-04-30 LAB — MAGNESIUM: Magnesium: 2.2 mg/dL (ref 1.5–2.5)

## 2021-04-30 MED ORDER — LUBIPROSTONE 24 MCG PO CAPS: 24.0000 ug | ORAL_CAPSULE | Freq: Two times a day (BID) | ORAL | 1 refills | Status: DC

## 2021-04-30 MED ORDER — LEVALBUTEROL TARTRATE 45 MCG/ACT IN AERO: 1.0000 | INHALATION_SPRAY | Freq: Four times a day (QID) | RESPIRATORY_TRACT | 2 refills | Status: DC | PRN

## 2021-04-30 MED ORDER — ALBUTEROL SULFATE HFA 108 (90 BASE) MCG/ACT IN AERS: 2.0000 | INHALATION_SPRAY | Freq: Four times a day (QID) | RESPIRATORY_TRACT | 5 refills | Status: DC | PRN

## 2021-04-30 NOTE — Progress Notes (Signed)
Subjective:  Patient ID: Wayne Silva, male    DOB: 11-Sep-1960  Age: 60 y.o. MRN: [id number]  CC: Constipation  This visit occurred during the SARS-CoV-2 public health emergency.  Safety protocols were in place, including screening questions prior to the visit, additional usage of staff PPE, and extensive cleaning of exam room while observing appropriate contact time as indicated for disinfecting solutions.    HPI [name] presents for f/up -  He has a history of constipation.  He was managing it with fiber until about a month or so ago when he stopped taking fiber.  He is not sure why he stopped using fiber.  He has since developed constipation and straining with tenesmus and blood in the stool.  Outpatient Medications Prior to Visit  Medication Sig Dispense Refill   azelastine (ASTELIN) 0.1 % nasal spray SPRAY 1 SPRAY INTO EACH NOSTRIL TWICE A DAY 20 mL 2   CIALIS 20 MG tablet TAKE 1 TABLET BY ORAL ROUTE EVERY DAY  10   fluocinonide ointment (LIDEX) 0.05 % Apply 1 application topically 2 (two) times daily. 60 g 0   fluticasone furoate-vilanterol (BREO ELLIPTA) 200-25 MCG/INH AEPB TAKE 1 PUFF BY MOUTH EVERY DAY 180 each 1   hydrocortisone (ANUSOL-HC) 25 MG suppository Place 1 suppository (25 mg total) rectally every 12 (twelve) hours. 12 suppository 2   levocetirizine (XYZAL) 5 MG tablet Take 1 tablet (5 mg total) by mouth every evening. 90 tablet 1   mirtazapine (REMERON) 15 MG tablet TAKE 1 TABLET BY MOUTH EVERYDAY AT BEDTIME 90 tablet 0   montelukast (SINGULAIR) 10 MG tablet TAKE 1 TABLET BY MOUTH EVERYDAY AT BEDTIME 30 tablet 0   Omega-3 Fatty Acids (FISH OIL PO) Take by mouth daily.      omeprazole (PRILOSEC) 40 MG capsule Take 1 capsule (40 mg total) by mouth daily. 30 capsule 6   triamcinolone (NASACORT) 55 MCG/ACT AERO nasal inhaler Place 2 sprays into the nose daily.     valACYclovir (VALTREX) 1000 MG tablet Take 1 tablet by mouth daily.     albuterol (VENTOLIN HFA) 108 (90  Base) MCG/ACT inhaler Inhale 2 puffs into the lungs every 6 (six) hours as needed for wheezing or shortness of breath.     benzonatate (TESSALON) 200 MG capsule Take 1 capsule (200 mg total) by mouth 3 (three) times daily as needed for cough. 30 capsule 1   HYDROcodone bit-homatropine (HYCODAN) 5-1.5 MG/5ML syrup Take 5 mLs by mouth every 8 (eight) hours as needed for cough. 120 mL 0   No facility-administered medications prior to visit.    ROS Review of Systems  Constitutional:  Negative for appetite change, chills, diaphoresis and fatigue.  HENT: Negative.    Respiratory:  Negative for cough, choking, chest tightness, shortness of breath and wheezing.   Cardiovascular: Negative.  Negative for chest pain, palpitations and leg swelling.  Gastrointestinal:  Positive for anal bleeding, constipation and rectal pain. Negative for abdominal distention, abdominal pain, diarrhea, nausea and vomiting.  Genitourinary:  Negative for difficulty urinating and dysuria.  Musculoskeletal: Negative.   Skin: Negative.   Neurological: Negative.  Negative for dizziness, weakness and headaches.  Hematological:  Negative for adenopathy. Does not bruise/bleed easily.  Psychiatric/Behavioral: Negative.     Objective:  BP 102/74 (BP Location: Right Arm, Patient Position: Sitting, Cuff Size: Normal)    Pulse 64    Temp 97.9 F (36.6 C) (Oral)    Ht 5\' 7"  (1.702 m)    Wt 133  lb (60.3 kg)    SpO2 98%    BMI 20.83 kg/m   BP Readings from Last 3 Encounters:  04/30/21 102/74  01/09/21 102/60  10/12/20 98/64    Wt Readings from Last 3 Encounters:  04/30/21 133 lb (60.3 kg)  01/09/21 125 lb (56.7 kg)  10/12/20 129 lb (58.5 kg)    Physical Exam Vitals reviewed.  Constitutional:      Appearance: Normal appearance.  HENT:     Nose: Nose normal.     Mouth/Throat:     Mouth: Mucous membranes are moist.  Eyes:     General: No scleral icterus.    Conjunctiva/sclera: Conjunctivae normal.  Cardiovascular:      Rate and Rhythm: Normal rate and regular rhythm.     Heart sounds: No murmur heard. Pulmonary:     Effort: Pulmonary effort is normal.     Breath sounds: No stridor. No wheezing, rhonchi or rales.  Abdominal:     General: Abdomen is scaphoid. There is no distension.     Palpations: Abdomen is soft. There is no hepatomegaly, splenomegaly or mass.     Tenderness: There is no abdominal tenderness. There is no guarding.  Genitourinary:    Prostate: Normal. Not enlarged, not tender and no nodules present.     Rectum: Guaiac result negative. External hemorrhoid and internal hemorrhoid present. No mass, tenderness or anal fissure. Normal anal tone.     Comments: There are uncomplicated int/ext anal hemorrhoids Musculoskeletal:        General: Normal range of motion.     Cervical back: Neck supple.     Right lower leg: No edema.     Left lower leg: No edema.  Lymphadenopathy:     Cervical: No cervical adenopathy.  Skin:    General: Skin is dry.  Neurological:     General: No focal deficit present.     Mental Status: He is alert.  Psychiatric:        Mood and Affect: Mood normal.        Behavior: Behavior normal.    Lab Results  Component Value Date   WBC 6.6 04/30/2021   HGB 14.8 04/30/2021   HCT 45.2 04/30/2021   PLT 174.0 04/30/2021   GLUCOSE 87 04/30/2021   CHOL 213 (H) 01/09/2021   TRIG 106.0 01/09/2021   HDL 82.30 01/09/2021   LDLCALC 110 (H) 01/09/2021   ALT 31 10/20/2019   AST 23 10/20/2019   NA 138 04/30/2021   K 3.8 04/30/2021   CL 100 04/30/2021   CREATININE 1.00 04/30/2021   BUN 26 (H) 04/30/2021   CO2 33 (H) 04/30/2021   TSH 2.50 04/30/2021   PSA 1.92 01/09/2021   HGBA1C 5.7 01/09/2021    DG Chest 2 View  Result Date: 06/08/2018 CLINICAL DATA:  Cough and wheezing for 3 days.  Former smoker. EXAM: CHEST - 2 VIEW COMPARISON:  05/01/2014 FINDINGS: The heart size and mediastinal contours are within normal limits. Both lungs are clear. The visualized skeletal  structures are unremarkable. IMPRESSION: No active cardiopulmonary disease. Electronically Signed   By: Myles Rosenthal M.D.   On: 06/08/2018 08:25    Assessment & Plan:   Normand was seen today for constipation.  Diagnoses and all orders for this visit:  Thrombocytopenia (HCC)- His platelet count is normal now -     CBC with Differential/Platelet; Future -     Vitamin B12; Future -     Folate; Future -  Folate -     Vitamin B12 -     CBC with Differential/Platelet  Chronic idiopathic constipation- Labs are negative for secondary causes.  Will treat with lubiprostone. -     Basic metabolic panel; Future -     TSH; Future -     Magnesium; Future -     Magnesium -     TSH -     Basic metabolic panel -     lubiprostone (AMITIZA) 24 MCG capsule; Take 1 capsule (24 mcg total) by mouth 2 (two) times daily with a meal.  Hemorrhoids, internal, with bleeding- I do not see any evidence of a fissure.  I recommended that he see GI to consider undergoing a banding procedure. -     Ambulatory referral to Gastroenterology  Mild intermittent asthma without complication -     Discontinue: albuterol (VENTOLIN HFA) 108 (90 Base) MCG/ACT inhaler; Inhale 2 puffs into the lungs every 6 (six) hours as needed for wheezing or shortness of breath. -     levalbuterol (XOPENEX HFA) 45 MCG/ACT inhaler; Inhale 1 puff into the lungs every 6 (six) hours as needed for wheezing.  Need for vaccination -     Pneumococcal conjugate vaccine 20-valent (Prevnar 20)   I have discontinued Lorcan Serda's albuterol, benzonatate, HYDROcodone bit-homatropine, and albuterol. I am also having him start on levalbuterol and lubiprostone. Additionally, I am having him maintain his valACYclovir, Omega-3 Fatty Acids (FISH OIL PO), Cialis, triamcinolone, fluocinonide ointment, levocetirizine, omeprazole, azelastine, montelukast, hydrocortisone, fluticasone furoate-vilanterol, and mirtazapine.  Meds ordered this encounter  Medications    DISCONTD: albuterol (VENTOLIN HFA) 108 (90 Base) MCG/ACT inhaler    Sig: Inhale 2 puffs into the lungs every 6 (six) hours as needed for wheezing or shortness of breath.    Dispense:  18 g    Refill:  5   levalbuterol (XOPENEX HFA) 45 MCG/ACT inhaler    Sig: Inhale 1 puff into the lungs every 6 (six) hours as needed for wheezing.    Dispense:  45 g    Refill:  2   lubiprostone (AMITIZA) 24 MCG capsule    Sig: Take 1 capsule (24 mcg total) by mouth 2 (two) times daily with a meal.    Dispense:  180 capsule    Refill:  1     Follow-up: Return in about 6 months (around 10/29/2021).  Sanda Linger, MD

## 2021-04-30 NOTE — Patient Instructions (Signed)

## 2021-05-03 ENCOUNTER — Encounter: Payer: Self-pay | Admitting: Internal Medicine

## 2021-05-03 DIAGNOSIS — R102 Pelvic and perineal pain: Secondary | ICD-10-CM | POA: Diagnosis not present

## 2021-05-03 DIAGNOSIS — F5221 Male erectile disorder: Secondary | ICD-10-CM | POA: Diagnosis not present

## 2021-05-07 ENCOUNTER — Other Ambulatory Visit: Payer: Self-pay | Admitting: Internal Medicine

## 2021-05-20 DIAGNOSIS — N401 Enlarged prostate with lower urinary tract symptoms: Secondary | ICD-10-CM | POA: Diagnosis not present

## 2021-05-20 DIAGNOSIS — R5383 Other fatigue: Secondary | ICD-10-CM | POA: Diagnosis not present

## 2021-05-20 DIAGNOSIS — N138 Other obstructive and reflux uropathy: Secondary | ICD-10-CM | POA: Diagnosis not present

## 2021-05-24 ENCOUNTER — Ambulatory Visit: Payer: BC Managed Care – PPO | Admitting: Gastroenterology

## 2021-06-06 DIAGNOSIS — R3129 Other microscopic hematuria: Secondary | ICD-10-CM | POA: Diagnosis not present

## 2021-06-06 DIAGNOSIS — N3010 Interstitial cystitis (chronic) without hematuria: Secondary | ICD-10-CM | POA: Diagnosis not present

## 2021-06-06 DIAGNOSIS — R339 Retention of urine, unspecified: Secondary | ICD-10-CM | POA: Diagnosis not present

## 2021-06-07 ENCOUNTER — Encounter: Payer: Self-pay | Admitting: Nurse Practitioner

## 2021-06-07 ENCOUNTER — Ambulatory Visit (INDEPENDENT_AMBULATORY_CARE_PROVIDER_SITE_OTHER): Payer: BC Managed Care – PPO | Admitting: Nurse Practitioner

## 2021-06-07 VITALS — BP 90/60 | HR 62 | Ht 67.0 in | Wt 132.0 lb

## 2021-06-07 DIAGNOSIS — K648 Other hemorrhoids: Secondary | ICD-10-CM | POA: Diagnosis not present

## 2021-06-07 DIAGNOSIS — K5904 Chronic idiopathic constipation: Secondary | ICD-10-CM | POA: Diagnosis not present

## 2021-06-07 MED ORDER — HYDROCORTISONE ACETATE 25 MG RE SUPP: 25.0000 mg | Freq: Every evening | RECTAL | 2 refills | Status: AC

## 2021-06-07 NOTE — Patient Instructions (Addendum)
If you are age 60 or younger, your body mass index should be between 19-25. Your Body mass index is 20.67 kg/m. If this is out of the aformentioned range listed, please consider follow up with your Primary Care Provider.   ________________________________________________________  The [facility] providers would like to encourage you to use [facility] to communicate with providers for non-urgent requests or questions.  Due to long hold times on the telephone, sending your provider a message by [facility] may be a faster and more efficient way to get a response.  Please allow 48 business hours for a response.  Please remember that this is for non-urgent requests.  _______________________________________________________  Continue daily fiber and 1 stool softener at bedtime. Be sure to drink 60 ounces of water daily.  Use Glycerine suppositories as needed   Refills of the Anusol suppositories have been sent to your pharmacy. Use 1 rectally at bed time for 7 days.  Call the office if symptoms are not improving.  Thank you for entrusting me with your care and choosing [facility].  Willette Cluster, NP-C

## 2021-06-07 NOTE — Progress Notes (Signed)
ASSESSMENT AND PLAN    # 61 yo male with chronic constipation and history of hemorrhoid surgery. Recent struggle with transient constipation , now resolved. He is here today  concerned about having recurrent internal hemorrhoids based on PCP's exam.  --No bleeding / perianal discomfort or other hemorrhoid symptoms. He is trying to be proactive and prevent need for future hemorrhoidectomy. On anoscopy he does have internal hemorrhoids but in absence of symptoms I do not see a need to arrange for banding. Need to manage constipation / avoid straining.  --Continue daily fiber, add stool softener --Can use Glycerin suppositories as needed --Anusol  supp Q HS x 7-10 days --Episodes of paradoxical sphincter contraction on rectal exam ( 2 out of 3 times). While this is not necessarily indicative of obstructed defecation, if straining persists he may benefit from anorectal manometry and / or pelvic floor PT  #Colon cancer screening.  He had a colonoscopy in 2024 at an outside facility.  I do not see those results but apparently they have apparently as he was placed on 10-year recall list  HISTORY OF PRESENT ILLNESS    Chief Complaint : hemorrhoids  Jaquan Sadowsky is a 61 y.o. male with a past medical history of GERD, asthm, Additional medical history as listed in [place] .   Patient is known to Dr.  Silverio Decamp.  He saw her September 2021 for some upper GI symptoms subsequent upper endoscopy was unremarkable, told to follow-up as needed  Patient is being seen at the request of his PCP.  Patient has a history of constipation but had been doing fine on fiber for a long time.  He became constipated for unclear reasons.  Patient thought that fiber could be constipating him so he stopped it.  That did not help so he started a stool softener which made his stools soft but did not help with straining.  He saw his PCP in December.  Patient explained the story about constipation but there was some confusion  surrounding some associated rectal bleeding.  Patient says he has not had any rectal bleeding.  PCPs rectal exam mentions external hemorrhoids and internal hemorrhoids.  His PCP had prescribed Anusol cream as well as Amitiza.  Patient has not gotten either prescription due to cost. Currently he is back on fiber and bowel movements are improving.  There are times when he still has to strain .   Duston gives a remote history of a hemorrhoidectomy.  He does not want a get to the point with hemorrhoids that he ever has to undergo a procedure like that again.    LABORATORY DATA  Hepatic Function Latest Ref Rng & Units 10/20/2019 02/12/2018 08/05/2016  Total Protein 6.0 - 8.3 g/dL 6.9 7.0 6.8  Albumin 3.5 - 5.2 g/dL 4.3 4.3 4.5  AST 0 - 37 U/L _0 ALT 0 - 53 U/L _1 Alk Phosphatase 39 - 117 U/L 59 57 58  Total Bilirubin 0.2 - 1.2 mg/dL 0.4 0.4 1.0    CBC Latest Ref Rng & Units 04/30/2021 01/09/2021 10/20/2019  WBC 4.0 - 10.5 K/uL 6.6 5.6 7.9  Hemoglobin 13.0 - 17.0 g/dL 14.8 14.8 14.8  Hematocrit 39.0 - 52.0 % 45.2 43.5 44.0  Platelets 150.0 - 400.0 K/uL 174.0 143.0(L) 156.0     Past Medical History:  Diagnosis Date   Alcoholic ([place])    Allergy    Anxiety    Asthma    Depression  Spastic bladder     Past Surgical History:  Procedure Laterality Date   HEMORRHOID SURGERY     KIDNEY STONE SURGERY        Current Medications, Allergies, Family History and Social History were reviewed in [employer] record.     Current Outpatient Medications  Medication Sig Dispense Refill   azelastine (ASTELIN) 0.1 % nasal spray SPRAY 1 SPRAY INTO EACH NOSTRIL TWICE A DAY 20 mL 2   CIALIS 20 MG tablet TAKE 1 TABLET BY ORAL ROUTE EVERY DAY  10   fluocinonide ointment (LIDEX) 0.17 % Apply 1 application topically 2 (two) times daily. 60 g 0   fluticasone furoate-vilanterol (BREO ELLIPTA) 200-25 MCG/INH AEPB TAKE 1 PUFF BY MOUTH EVERY DAY 180 each 1   hydrocortisone  (ANUSOL-HC) 25 MG suppository Place 1 suppository (25 mg total) rectally every 12 (twelve) hours. 12 suppository 2   levalbuterol (XOPENEX HFA) 45 MCG/ACT inhaler Inhale 1 puff into the lungs every 6 (six) hours as needed for wheezing. 45 g 2   levocetirizine (XYZAL) 5 MG tablet Take 1 tablet (5 mg total) by mouth every evening. 90 tablet 1   lubiprostone (AMITIZA) 24 MCG capsule Take 1 capsule (24 mcg total) by mouth 2 (two) times daily with a meal. 180 capsule 1   mirtazapine (REMERON) 15 MG tablet TAKE 1 TABLET BY MOUTH EVERYDAY AT BEDTIME 90 tablet 0   montelukast (SINGULAIR) 10 MG tablet TAKE 1 TABLET BY MOUTH EVERYDAY AT BEDTIME 30 tablet 0   Omega-3 Fatty Acids (FISH OIL PO) Take by mouth daily.      omeprazole (PRILOSEC) 40 MG capsule Take 1 capsule (40 mg total) by mouth daily. 30 capsule 6   triamcinolone (NASACORT) 55 MCG/ACT AERO nasal inhaler Place 2 sprays into the nose daily.     valACYclovir (VALTREX) 1000 MG tablet Take 1 tablet by mouth daily.     No current facility-administered medications for this visit.    Review of Systems: No chest pain. No shortness of breath. No urinary complaints.   PHYSICAL EXAM :    Wt Readings from Last 3 Encounters:  06/07/21 132 lb (59.9 kg)  04/30/21 133 lb (60.3 kg)  01/09/21 125 lb (56.7 kg)    BP 90/60    Pulse 62    Ht 5' 7" (1.702 m)    Wt 132 lb (59.9 kg)    BMI 20.67 kg/m  Constitutional:  Generally well appearing male in no acute distress. Psychiatric: Pleasant. Normal mood and affect. Behavior is normal. EENT: Pupils normal.  Conjunctivae are normal. No scleral icterus. Neck supple.  Cardiovascular: Normal rate, regular rhythm. No edema Pulmonary/chest: Effort normal and breath sounds normal. No wheezing, rales or rhonchi. Abdominal: Soft, nondistended, nontender. Bowel sounds active throughout. There are no masses palpable. No hepatomegaly. Rectal: No external lesions. Good pelvic descent with valsalva. Good sphincter tone.   Contraction of internal sphincter when asked to bear down ( 2 out of 3 times) Neurological: Alert and oriented to person place and time. Skin: Skin is warm and dry. No rashes noted.  Tye Savoy, NP  06/07/2021, 3:15 PM  Cc:  Janith Lima, MD

## 2021-06-10 ENCOUNTER — Ambulatory Visit: Payer: BC Managed Care – PPO | Admitting: Physician Assistant

## 2021-06-11 ENCOUNTER — Encounter: Payer: Self-pay | Admitting: Nurse Practitioner

## 2021-06-11 ENCOUNTER — Ambulatory Visit: Payer: BC Managed Care – PPO | Admitting: Physician Assistant

## 2021-06-27 DIAGNOSIS — N3010 Interstitial cystitis (chronic) without hematuria: Secondary | ICD-10-CM | POA: Diagnosis not present

## 2021-07-03 ENCOUNTER — Other Ambulatory Visit: Payer: Self-pay | Admitting: Internal Medicine

## 2021-07-03 DIAGNOSIS — F411 Generalized anxiety disorder: Secondary | ICD-10-CM

## 2021-07-03 DIAGNOSIS — F450 Somatization disorder: Secondary | ICD-10-CM

## 2021-07-03 NOTE — Progress Notes (Signed)
Reviewed and agree with documentation and assessment and plan. K. Veena Bron Snellings , MD   

## 2021-07-20 ENCOUNTER — Other Ambulatory Visit: Payer: Self-pay | Admitting: Internal Medicine

## 2021-07-20 DIAGNOSIS — J4520 Mild intermittent asthma, uncomplicated: Secondary | ICD-10-CM

## 2021-08-19 DIAGNOSIS — J3489 Other specified disorders of nose and nasal sinuses: Secondary | ICD-10-CM | POA: Diagnosis not present

## 2021-08-19 DIAGNOSIS — J343 Hypertrophy of nasal turbinates: Secondary | ICD-10-CM | POA: Diagnosis not present

## 2021-08-19 DIAGNOSIS — M950 Acquired deformity of nose: Secondary | ICD-10-CM | POA: Diagnosis not present

## 2021-08-19 DIAGNOSIS — J342 Deviated nasal septum: Secondary | ICD-10-CM | POA: Diagnosis not present

## 2021-10-04 ENCOUNTER — Other Ambulatory Visit: Payer: Self-pay | Admitting: Internal Medicine

## 2021-10-04 DIAGNOSIS — F411 Generalized anxiety disorder: Secondary | ICD-10-CM

## 2021-10-04 DIAGNOSIS — F32A Depression, unspecified: Secondary | ICD-10-CM

## 2021-11-02 ENCOUNTER — Other Ambulatory Visit: Payer: Self-pay | Admitting: Internal Medicine

## 2021-11-02 DIAGNOSIS — F411 Generalized anxiety disorder: Secondary | ICD-10-CM

## 2021-11-02 DIAGNOSIS — F32A Depression, unspecified: Secondary | ICD-10-CM

## 2021-11-25 ENCOUNTER — Ambulatory Visit (INDEPENDENT_AMBULATORY_CARE_PROVIDER_SITE_OTHER): Payer: BC Managed Care – PPO | Admitting: Family Medicine

## 2021-11-25 ENCOUNTER — Encounter: Payer: Self-pay | Admitting: Family Medicine

## 2021-11-25 VITALS — BP 104/58 | HR 60 | Temp 98.6°F | Wt 129.0 lb

## 2021-11-25 DIAGNOSIS — H00025 Hordeolum internum left lower eyelid: Secondary | ICD-10-CM | POA: Insufficient documentation

## 2021-11-25 MED ORDER — SULFAMETHOXAZOLE-TRIMETHOPRIM 800-160 MG PO TABS: 1.0000 | ORAL_TABLET | Freq: Two times a day (BID) | ORAL | 0 refills | Status: DC

## 2021-11-25 NOTE — Patient Instructions (Signed)
Take the oral antibiotic as prescribed.  You can use cool or warm compresses to the area as well.  Let us know if you are getting worse or if you are not back to baseline when you complete the antibiotic.   Stye A stye, also known as a hordeolum, is a bump that forms on an eyelid. It may look like a pimple next to the eyelash. A stye can form inside the eyelid (internal stye) or outside the eyelid (external stye). A stye can cause redness, swelling, and pain on the eyelid. Styes are very common. Anyone can get them at any age. They usually occur in just one eye at a time, but you may have more than one in either eye. What are the causes? A stye is caused by an infection. The infection is almost always caused by bacteria called Staphylococcus aureus. This is a common type of bacteria that lives on the skin. An internal stye may result from an infected oil-producing gland inside the eyelid. An external stye may be caused by an infection at the base of the eyelash (hair follicle). What increases the risk? You are more likely to develop a stye if: You have had a stye before. You have any of these conditions: Red, itchy, inflamed eyelids (blepharitis). A skin condition such as seborrheic dermatitis or rosacea. High fat levels in your blood (lipids). Dry eyes. What are the signs or symptoms? The most common symptom of a stye is eyelid pain. Internal styes are more painful than external styes. Other symptoms may include: Painful swelling of your eyelid. A scratchy feeling in your eye. Tearing and redness of your eye. A pimple-like bump on the edge of the eyelid. Pus draining from the stye. How is this diagnosed? Your health care provider may be able to diagnose a stye just by examining your eye. The health care provider may also check to make sure: You do not have a fever or other signs of a more serious infection. The infection has not spread to other parts of your eye or areas around your  eye. How is this treated? Most styes will clear up in a few days without treatment or with warm compresses applied to the area. You may need to use antibiotic drops or ointment to treat an infection. Sometimes, steroid drops or ointment are used in addition to antibiotics. In some cases, your health care provider may give you a small steroid injection in the eyelid. If your stye does not heal with routine treatment, your health care provider may drain pus from the stye using a thin blade or needle. This may be done if the stye is large, causing a lot of pain, or affecting your vision. Follow these instructions at home: Take over-the-counter and prescription medicines only as told by your health care provider. This includes eye drops or ointments. If you were prescribed an antibiotic medicine, steroid medicine, or both, apply or use them as told by your health care provider. Do not stop using the medicine even if your condition improves. Apply a warm, wet cloth (warm compress) to your eye for 5-10 minutes, 4 to 6 times a day. Clean the affected eyelid as directed by your health care provider. Do not wear contact lenses or eye makeup until your stye has healed and your health care provider says that it is safe. Do not try to pop or drain the stye. Do not rub your eye. Contact a health care provider if: You have chills or a  fever. Your stye does not go away after several days. Your stye affects your vision. Your eyeball becomes swollen, red, or painful. Get help right away if: You have pain when moving your eye around. Summary A stye is a bump that forms on an eyelid. It may look like a pimple next to the eyelash. A stye can form inside the eyelid (internal stye) or outside the eyelid (external stye). A stye can cause redness, swelling, and pain on the eyelid. Your health care provider may be able to diagnose a stye just by examining your eye. Apply a warm, wet cloth (warm compress) to your eye  for 5-10 minutes, 4 to 6 times a day. This information is not intended to replace advice given to you by your health care provider. Make sure you discuss any questions you have with your health care provider. Document Revised: 07/04/2020 Document Reviewed: 07/04/2020 Elsevier Patient Education  2023 [employer].

## 2021-11-25 NOTE — Progress Notes (Signed)
Subjective:     Patient ID: Wayne Silva, male    DOB: 10/09/1960, 61 y.o.   MRN: [id number]  Chief Complaint  Patient presents with   Pain and swelling of left eye    For several weeks    HPI Patient is in today for a 2 -4 week hx of left lower lid pain. No drainage or matting. No eye pain or foreign body sensation. Vision normal.   No fever, chills.    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[facility])    Allergy    Anxiety    Asthma    Depression    Spastic bladder     Past Surgical History:  Procedure Laterality Date   HEMORRHOID SURGERY     KIDNEY STONE SURGERY      Family History  Problem Relation Age of Onset   Skin cancer Mother    Colon cancer Father        dx in his late 79s or early 18s   Alzheimer's disease Father    Pancreatic cancer Paternal Grandfather    Alzheimer's disease Maternal Grandmother    Allergic rhinitis Neg Hx    Angioedema Neg Hx    Asthma Neg Hx    Eczema Neg Hx    Urticaria Neg Hx    Immunodeficiency Neg Hx    Rectal cancer Neg Hx    Stomach cancer Neg Hx    Esophageal cancer Neg Hx     Social History   Socioeconomic History   Marital status: Single    Spouse name: Not on file   Number of children: Not on file   Years of education: Not on file   Highest education level: Not on file  Occupational History   Not on file  Tobacco Use   Smoking status: Former    Packs/day: 0.25    Years: 10.00    Total pack years: 2.50    Types: Cigarettes    Quit date: 08/22/2004    Years since quitting: 17.2   Smokeless tobacco: Never  Vaping Use   Vaping Use: Never used  Substance and Sexual Activity   Alcohol use: No    Alcohol/week: 0.0 standard drinks of alcohol    Comment: was an alcoholic (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 ago as of 01/18/20)   Drug use: No   Sexual activity: Yes    Partners:  Female    Birth control/protection: Condom  Other Topics Concern   Not on file  Social History Narrative   Not on file   Social Determinants of Health   Financial Resource Strain: Not on file  Food Insecurity: Not on file  Transportation Needs: Not on file  Physical Activity: Not on file  Stress: Not on file  Social Connections: Not on file  Intimate Partner Violence: Not on file    Outpatient Medications Prior to Visit  Medication Sig Dispense Refill   azelastine (ASTELIN) 0.1 % nasal spray SPRAY 1 SPRAY INTO EACH NOSTRIL TWICE A DAY 20 mL 2   BREO ELLIPTA 200-25 MCG/ACT AEPB INHALE 1 PUFF BY MOUTH EVERY DAY 180 each 1   CIALIS 20 MG tablet TAKE 1 TABLET BY ORAL ROUTE EVERY DAY  10   fluocinonide ointment (LIDEX) 0.05 % Apply 1 application topically 2 (two) times daily. 60 g 0  levalbuterol (XOPENEX HFA) 45 MCG/ACT inhaler Inhale 1 puff into the lungs every 6 (six) hours as needed for wheezing. 45 g 2   levocetirizine (XYZAL) 5 MG tablet Take 1 tablet (5 mg total) by mouth every evening. 90 tablet 1   lubiprostone (AMITIZA) 24 MCG capsule Take 1 capsule (24 mcg total) by mouth 2 (two) times daily with a meal. 180 capsule 1   mirtazapine (REMERON) 15 MG tablet TAKE 1 TABLET BY MOUTH EVERYDAY AT BEDTIME 90 tablet 1   montelukast (SINGULAIR) 10 MG tablet TAKE 1 TABLET BY MOUTH EVERYDAY AT BEDTIME 30 tablet 0   Omega-3 Fatty Acids (FISH OIL PO) Take by mouth daily.      omeprazole (PRILOSEC) 40 MG capsule Take 1 capsule (40 mg total) by mouth daily. 30 capsule 6   triamcinolone (NASACORT) 55 MCG/ACT AERO nasal inhaler Place 2 sprays into the nose daily.     valACYclovir (VALTREX) 1000 MG tablet Take 1 tablet by mouth daily.     No facility-administered medications prior to visit.    No Known Allergies  ROS     Objective:    Physical Exam Constitutional:      General: He is not in acute distress.    Appearance: He is not ill-appearing.  Eyes:     General: Gaze aligned  appropriately.        Right eye: Hordeolum present.     Extraocular Movements: Extraocular movements intact.     Conjunctiva/sclera: Conjunctivae normal.     Pupils: Pupils are equal, round, and reactive to light.     Comments: Small pustule on left lower inner lid without signficant edema, drainage or surround erythema.   Cardiovascular:     Rate and Rhythm: Normal rate.  Pulmonary:     Effort: Pulmonary effort is normal.  Skin:    General: Skin is warm and dry.  Neurological:     General: No focal deficit present.     Mental Status: He is alert and oriented to person, place, and time.     BP (!) 104/58 (BP Location: Right Arm, Patient Position: Sitting, Cuff Size: Large)   Pulse 60   Temp 98.6 F (37 C) (Oral)   Wt 129 lb (58.5 kg)   SpO2 96%   BMI 20.20 kg/m  Wt Readings from Last 3 Encounters:  11/25/21 129 lb (58.5 kg)  06/07/21 132 lb (59.9 kg)  04/30/21 133 lb (60.3 kg)       Assessment & Plan:   Problem List Items Addressed This Visit       Musculoskeletal and Integument   Hordeolum internum of left lower eyelid - Primary    Bactrim prescribed. May use cool or warm compresses for comfort. Follow up if not back to baseline after completing the antibiotic or sooner if worsening.       Relevant Medications   sulfamethoxazole-trimethoprim (BACTRIM DS) 800-160 MG tablet    I am having Wayne Silva start on sulfamethoxazole-trimethoprim. I am also having him maintain his valACYclovir, Omega-3 Fatty Acids (FISH OIL PO), Cialis, triamcinolone, fluocinonide ointment, levocetirizine, omeprazole, azelastine, montelukast, levalbuterol, lubiprostone, Breo Ellipta, and mirtazapine.  Meds ordered this encounter  Medications   sulfamethoxazole-trimethoprim (BACTRIM DS) 800-160 MG tablet    Sig: Take 1 tablet by mouth 2 (two) times daily.    Dispense:  20 tablet    Refill:  0    Order Specific Question:   Supervising Provider    Answer:   Hillard Danker A [[id number]]

## 2021-11-25 NOTE — Assessment & Plan Note (Signed)
Bactrim prescribed. May use cool or warm compresses for comfort. Follow up if not back to baseline after completing the antibiotic or sooner if worsening.

## 2021-12-26 DIAGNOSIS — N3010 Interstitial cystitis (chronic) without hematuria: Secondary | ICD-10-CM | POA: Diagnosis not present

## 2022-01-23 DIAGNOSIS — J342 Deviated nasal septum: Secondary | ICD-10-CM | POA: Diagnosis not present

## 2022-01-23 DIAGNOSIS — M950 Acquired deformity of nose: Secondary | ICD-10-CM | POA: Diagnosis not present

## 2022-01-23 DIAGNOSIS — J3489 Other specified disorders of nose and nasal sinuses: Secondary | ICD-10-CM | POA: Diagnosis not present

## 2022-01-23 DIAGNOSIS — J343 Hypertrophy of nasal turbinates: Secondary | ICD-10-CM | POA: Diagnosis not present

## 2022-02-03 ENCOUNTER — Other Ambulatory Visit: Payer: Self-pay | Admitting: Internal Medicine

## 2022-02-03 ENCOUNTER — Ambulatory Visit (INDEPENDENT_AMBULATORY_CARE_PROVIDER_SITE_OTHER): Payer: BC Managed Care – PPO

## 2022-02-03 DIAGNOSIS — Z23 Encounter for immunization: Secondary | ICD-10-CM

## 2022-02-03 DIAGNOSIS — J4520 Mild intermittent asthma, uncomplicated: Secondary | ICD-10-CM

## 2022-02-03 NOTE — Progress Notes (Signed)
After obtaining consent, and per orders of Dr. Ronnald Ramp, injection of Shingirx given in the left deltoid and Flu given in the right deltoid by Marrian Salvage. Patient tolerated both well and instructed  to report any adverse reaction to me immediately.

## 2022-02-13 ENCOUNTER — Other Ambulatory Visit: Payer: Self-pay | Admitting: Family Medicine

## 2022-02-13 DIAGNOSIS — L821 Other seborrheic keratosis: Secondary | ICD-10-CM | POA: Diagnosis not present

## 2022-02-13 DIAGNOSIS — D2239 Melanocytic nevi of other parts of face: Secondary | ICD-10-CM | POA: Diagnosis not present

## 2022-02-13 DIAGNOSIS — B009 Herpesviral infection, unspecified: Secondary | ICD-10-CM | POA: Diagnosis not present

## 2022-02-13 DIAGNOSIS — L820 Inflamed seborrheic keratosis: Secondary | ICD-10-CM | POA: Diagnosis not present

## 2022-02-13 DIAGNOSIS — L815 Leukoderma, not elsewhere classified: Secondary | ICD-10-CM | POA: Diagnosis not present

## 2022-02-15 ENCOUNTER — Encounter: Payer: Self-pay | Admitting: Internal Medicine

## 2022-03-03 DIAGNOSIS — Z9889 Other specified postprocedural states: Secondary | ICD-10-CM | POA: Insufficient documentation

## 2022-03-03 DIAGNOSIS — J309 Allergic rhinitis, unspecified: Secondary | ICD-10-CM | POA: Insufficient documentation

## 2022-03-11 ENCOUNTER — Ambulatory Visit (INDEPENDENT_AMBULATORY_CARE_PROVIDER_SITE_OTHER): Payer: BC Managed Care – PPO | Admitting: Internal Medicine

## 2022-03-11 ENCOUNTER — Encounter: Payer: Self-pay | Admitting: Internal Medicine

## 2022-03-11 VITALS — BP 96/68 | HR 62 | Temp 97.7°F | Resp 16 | Ht 66.54 in | Wt 125.3 lb

## 2022-03-11 DIAGNOSIS — J4540 Moderate persistent asthma, uncomplicated: Secondary | ICD-10-CM

## 2022-03-11 DIAGNOSIS — T781XXA Other adverse food reactions, not elsewhere classified, initial encounter: Secondary | ICD-10-CM

## 2022-03-11 DIAGNOSIS — J3089 Other allergic rhinitis: Secondary | ICD-10-CM | POA: Diagnosis not present

## 2022-03-11 DIAGNOSIS — H1045 Other chronic allergic conjunctivitis: Secondary | ICD-10-CM

## 2022-03-11 DIAGNOSIS — J302 Other seasonal allergic rhinitis: Secondary | ICD-10-CM

## 2022-03-11 NOTE — Progress Notes (Signed)
Follow Up Note  RE: Wayne Silva MRN: [id number] DOB: 01-31-1961 Date of Office Visit: 03/11/2022  Referring provider: [name], MD Primary care provider: [name], MD  Chief Complaint: Asthma (Having a little chest tightness lately) and Food Intolerance (After he eats certain foods ?Garlic,corn, peanuts, throat,chest, stomach feels inflamed)  History of Present Illness: I had the pleasure of seeing Wayne Silva for a follow up visit at the [facility] on 03/11/2022. He is a 61 y.o. male, who is being followed for persistent asthma, chronic rhinitis. His previous allergy office visit was on 07/18/2019 with Dr. Beaulah Dinning. Today is a regular follow up visit.  History obtained from patient, chart review.  ASTHMA - Medical therapy: Breo 200 mcg 1 puff daily, montelukast 10 mg daily - Rescue inhaler use: 2-3 times per week with some benefit  - Symptoms: increased chest tightness over the past 3 weeks and decreased exercise tolerance  - Exacerbation history: 0 ABX for respiratory illness since last visit, 0 OCS, 0ED, 0 UC visits in the past year  - ACT: 18 /25 - Adverse effects of medication: denies  - Previous FEV1: 3.20 L,  - Biologic Labs not done   Seasonal and perennial rhinitis: current therapy: Azelastine nasal spray 1 spray per nostril daily , nasacort 2 sprays per day  symptoms partially improved symptoms include: nasal congestion and rhinorrhea Previous allergy testing:  06/2018: Positive to grass pollens, weed pollen, tree pollen, mold History of reflux/heartburn: no Interested in Allergy Immunotherapy: no  He has underwent septorhinoplasty and turbinate reduction on August 19, 2021. Which helped somewhat   Food Intolerance  - He also reports throat irritation, chest tightness and upset stomach with peanut, garlic and corn.  Symptoms occur within 1 hour of eating and symptoms last 2-3 hours.  He has not tried any treatment.  Does not occur every  time he eats.  He is interested in food allergy testing.    Assessment and Plan: Katelyn is a 61 y.o. male with: Moderate persistent asthma without complication - Plan: Spirometry with Graph  Seasonal and perennial allergic rhinitis  Other chronic allergic conjunctivitis of both eyes  Adverse food reaction, initial encounter Plan: Patient Instructions  Persistent asthma:  not well  controlled - Breathing test today showed: looked okay  - However based on symptoms asthma is not well controlled, we need to focus on taking Breo every day  - If no response to increasing Breo we may consider stepping up to Trelegy or look for alternative options    PLAN:  - Spacer not needed with current regimen. - Daily controller medication(s): Singulair 10mg  daily and Breo 200/76mcg one puff once daily - Prior to physical activity: albuterol 2 puffs 10-15 minutes before physical activity. - Rescue medications: albuterol 4 puffs every 4-6 hours as needed  - Asthma control goals:  * Full participation in all desired activities (may need albuterol before activity) * Albuterol use two time or less a week on average (not counting use with activity) * Cough interfering with sleep two time or less a month * Oral steroids no more than once a year * No hospitalizations  Seasonal and Perennial  Rhinitis not well controlled: - Some inflammation in nose may be due to healing from surgery  - allergen avoidance to grass pollen, weed pollen, tree pollen, mold - consider allergy shots as long term control of your symptoms by teaching your immune system to be more tolerant of your  allergy triggers - Start Ryaltris 2 sprays per nostril twice daily (sample given today) if you like this instead of azelastine and Nasacort we can put a prescription in - Continue Singulair (Montelukast) 10mg  nightly. - Consider over the counter antihistamine daily or daily as needed.   -Your options include Zyrtec (Cetirizine) 10mg ,  Claritin (Loratadine) 10mg , Allegra (Fexofenadine) 180mg , or Xyzal (Levocetirinze) 5mg    Concern for food allergy  -Continue to avoid peanut, corn, garlic -We will do full food allergy panel testing at your follow-up appointment -Hold all antihistamines for 3 days prior to this appointment this includes Zyrtec (Cetirizine), Claritin (Loratadine), Allegra (Fexofenadine), Xyzal (Levocetirinze) or Benadryl ( diphenhydramine)   Follow up: 4 weeks   Thank you so much for letting me partake in your care today.  Don't hesitate to reach out if you have any additional concerns!  Roney Marion, MD  [facility] [place] [place]    No follow-ups on file.  No orders of the defined types were placed in this encounter.   Lab Orders  No laboratory test(s) ordered today   Diagnostics: Spirometry:  Tracings reviewed. His effort: Good reproducible efforts. FVC: 4.16 L FEV1: 3.47 L, 110% predicted FEV1/FVC ratio: 83% Interpretation: Spirometry consistent with normal pattern.  Please see scanned spirometry results for details.  Results interpreted by myself during this encounter and discussed with patient/family.   Medication List:  Current Outpatient Medications  Medication Sig Dispense Refill   azelastine (ASTELIN) 0.1 % nasal spray SPRAY 1 SPRAY INTO EACH NOSTRIL TWICE A DAY 20 mL 2   BREO ELLIPTA 200-25 MCG/ACT AEPB INHALE 1 PUFF BY MOUTH EVERY DAY 180 each 0   levalbuterol (XOPENEX HFA) 45 MCG/ACT inhaler Inhale 1 puff into the lungs every 6 (six) hours as needed for wheezing. 45 g 2   mirtazapine (REMERON) 15 MG tablet Take by mouth.     montelukast (SINGULAIR) 10 MG tablet Take 1 tablet by mouth daily.     Omega-3 Fatty Acids (FISH OIL PO) Take by mouth daily.      tadalafil (CIALIS) 5 MG tablet Take 1 tablet by mouth daily.     triamcinolone (NASACORT) 55 MCG/ACT AERO nasal inhaler Place 2 sprays into the nose daily.     valACYclovir (VALTREX) 1000 MG tablet Take 1  tablet by mouth daily.     No current facility-administered medications for this visit.   Allergies: No Known Allergies I reviewed his past medical history, social history, family history, and environmental history and no significant changes have been reported from his previous visit.  ROS: All others negative except as noted per HPI.   Objective: BP 96/68 (BP Location: Left Arm, Patient Position: Sitting, Cuff Size: Normal)   Pulse 62   Temp 97.7 F (36.5 C) (Temporal)   Resp 16   Ht 5' 6.54" (1.69 m)   Wt 125 lb 4.8 oz (56.8 kg)   SpO2 99%   BMI 19.90 kg/m  Body mass index is 19.9 kg/m. General Appearance:  Alert, cooperative, no distress, appears stated age  Head:  Normocephalic, without obvious abnormality, atraumatic  Eyes:  Conjunctiva clear, EOM's intact  Nose: Nares normal,  erythematous nasal mucosa, hypertrophic turbinates, no visible anterior polyps, and septum midline  Throat: Lips, tongue normal; teeth and gums normal, normal posterior oropharynx and no tonsillar exudate  Neck: Supple, symmetrical  Lungs:   clear to auscultation bilaterally, Respirations unlabored, no coughing  Heart:  regular rate and rhythm and no murmur, Appears well  perfused  Extremities: No edema  Skin: Skin color, texture, turgor normal, no rashes or lesions on visualized portions of skin   Neurologic: No gross deficits   Previous notes and tests were reviewed. The plan was reviewed with the patient/family, and all questions/concerned were addressed.  It was my pleasure to see Sharvil today and participate in his care. Please feel free to contact me with any questions or concerns.  Sincerely,  Ferol Luz, MD  Allergy & Immunology  [facility] Office: [phone]

## 2022-03-11 NOTE — Patient Instructions (Signed)
Persistent asthma:  not well  controlled - Breathing test today showed: looked okay  - However based on symptoms asthma is not well controlled, we need to focus on taking Breo every day  - If no response to increasing Breo we may consider stepping up to Trelegy or look for alternative options    PLAN:  - Spacer not needed with current regimen. - Daily controller medication(s): Singulair 10mg  daily and Breo 200/20mcg one puff once daily - Prior to physical activity: albuterol 2 puffs 10-15 minutes before physical activity. - Rescue medications: albuterol 4 puffs every 4-6 hours as needed  - Asthma control goals:  * Full participation in all desired activities (may need albuterol before activity) * Albuterol use two time or less a week on average (not counting use with activity) * Cough interfering with sleep two time or less a month * Oral steroids no more than once a year * No hospitalizations  Seasonal and Perennial  Rhinitis not well controlled: - Some inflammation in nose may be due to healing from surgery  - allergen avoidance to grass pollen, weed pollen, tree pollen, mold - consider allergy shots as long term control of your symptoms by teaching your immune system to be more tolerant of your allergy triggers - Start Ryaltris 2 sprays per nostril twice daily (sample given today) if you like this instead of azelastine and Nasacort we can put a prescription in - Continue Singulair (Montelukast) 10mg  nightly. - Consider over the counter antihistamine daily or daily as needed.   -Your options include Zyrtec (Cetirizine) 10mg , Claritin (Loratadine) 10mg , Allegra (Fexofenadine) 180mg , or Xyzal (Levocetirinze) 5mg    Concern for food allergy  -Continue to avoid peanut, corn, garlic -We will do full food allergy panel testing at your follow-up appointment -Hold all antihistamines for 3 days prior to this appointment this includes Zyrtec (Cetirizine), Claritin (Loratadine), Allegra  (Fexofenadine), Xyzal (Levocetirinze) or Benadryl ( diphenhydramine)   Follow up: 4 weeks   Thank you so much for letting me partake in your care today.  Don't hesitate to reach out if you have any additional concerns!  Roney Marion, MD  [facility] [place], [place]

## 2022-03-22 IMAGING — DX DG CHEST 2V
2 series · 2 of 2 positions shown · non-contrast
Comparison: June 07, 2018.

CLINICAL DATA: Cough, cold for a month.

EXAM:
CHEST - 2 VIEW

[chest pa]
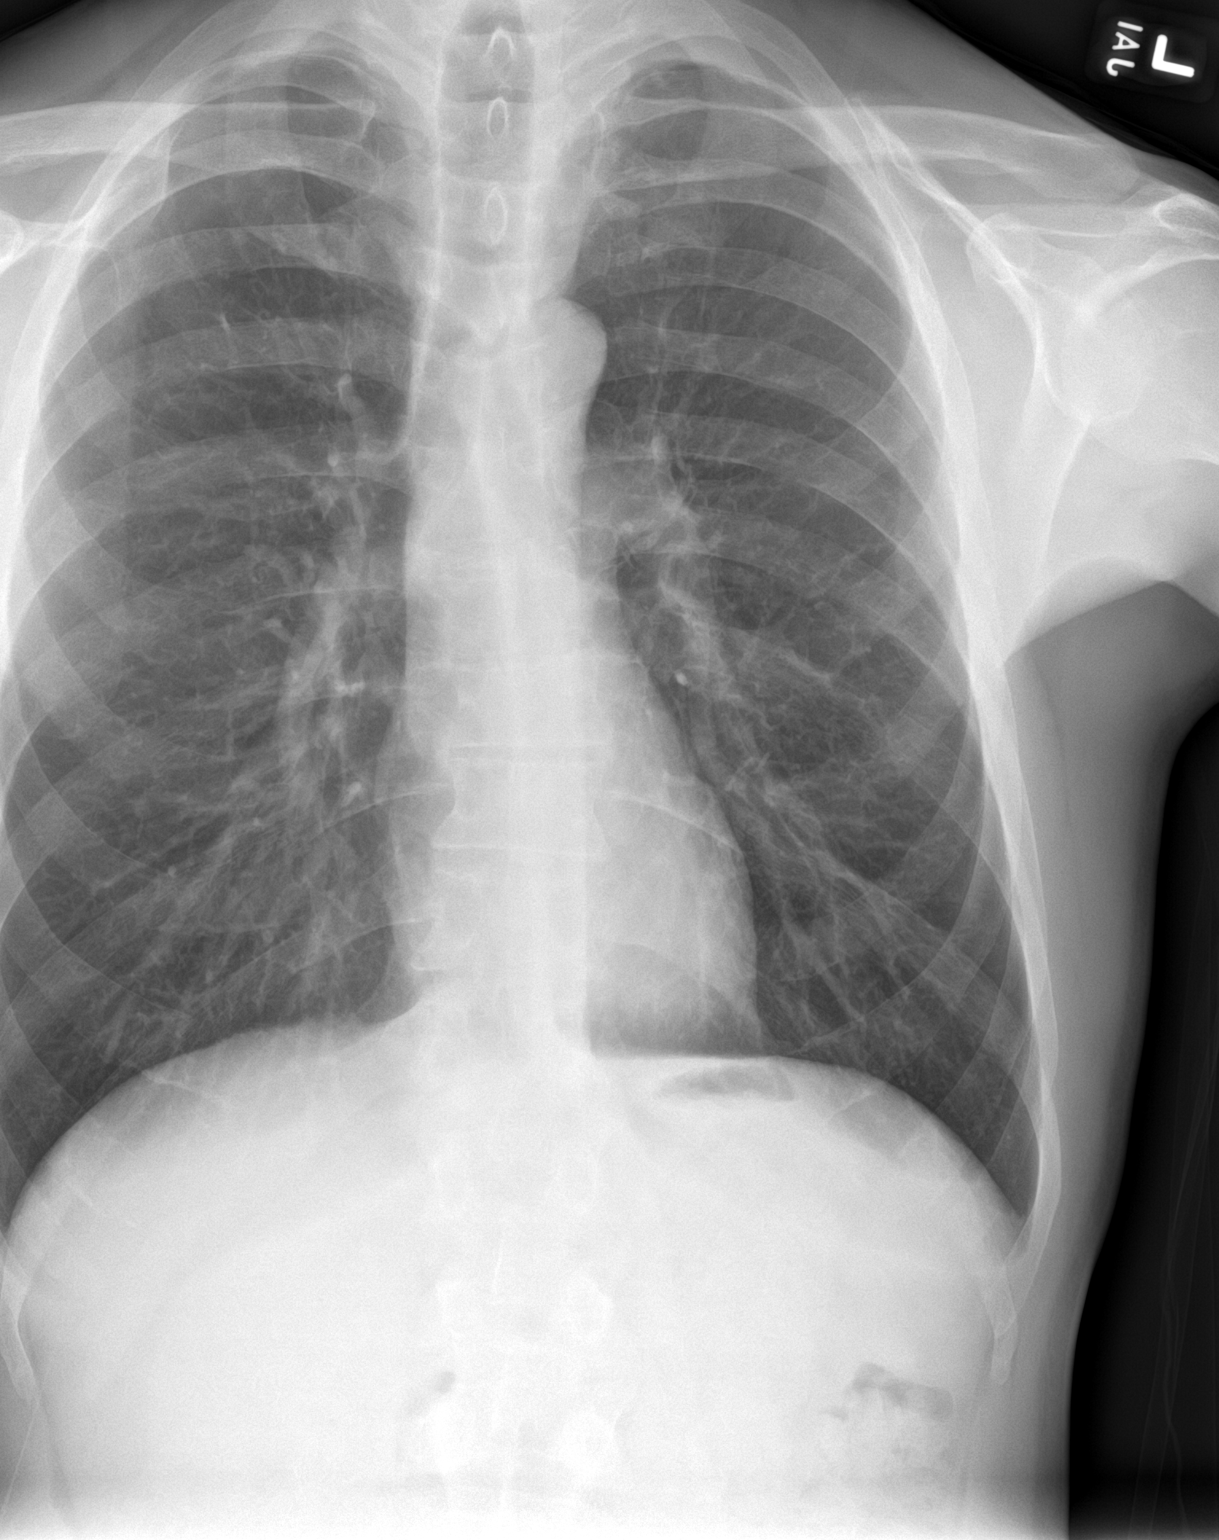

[chest lat]
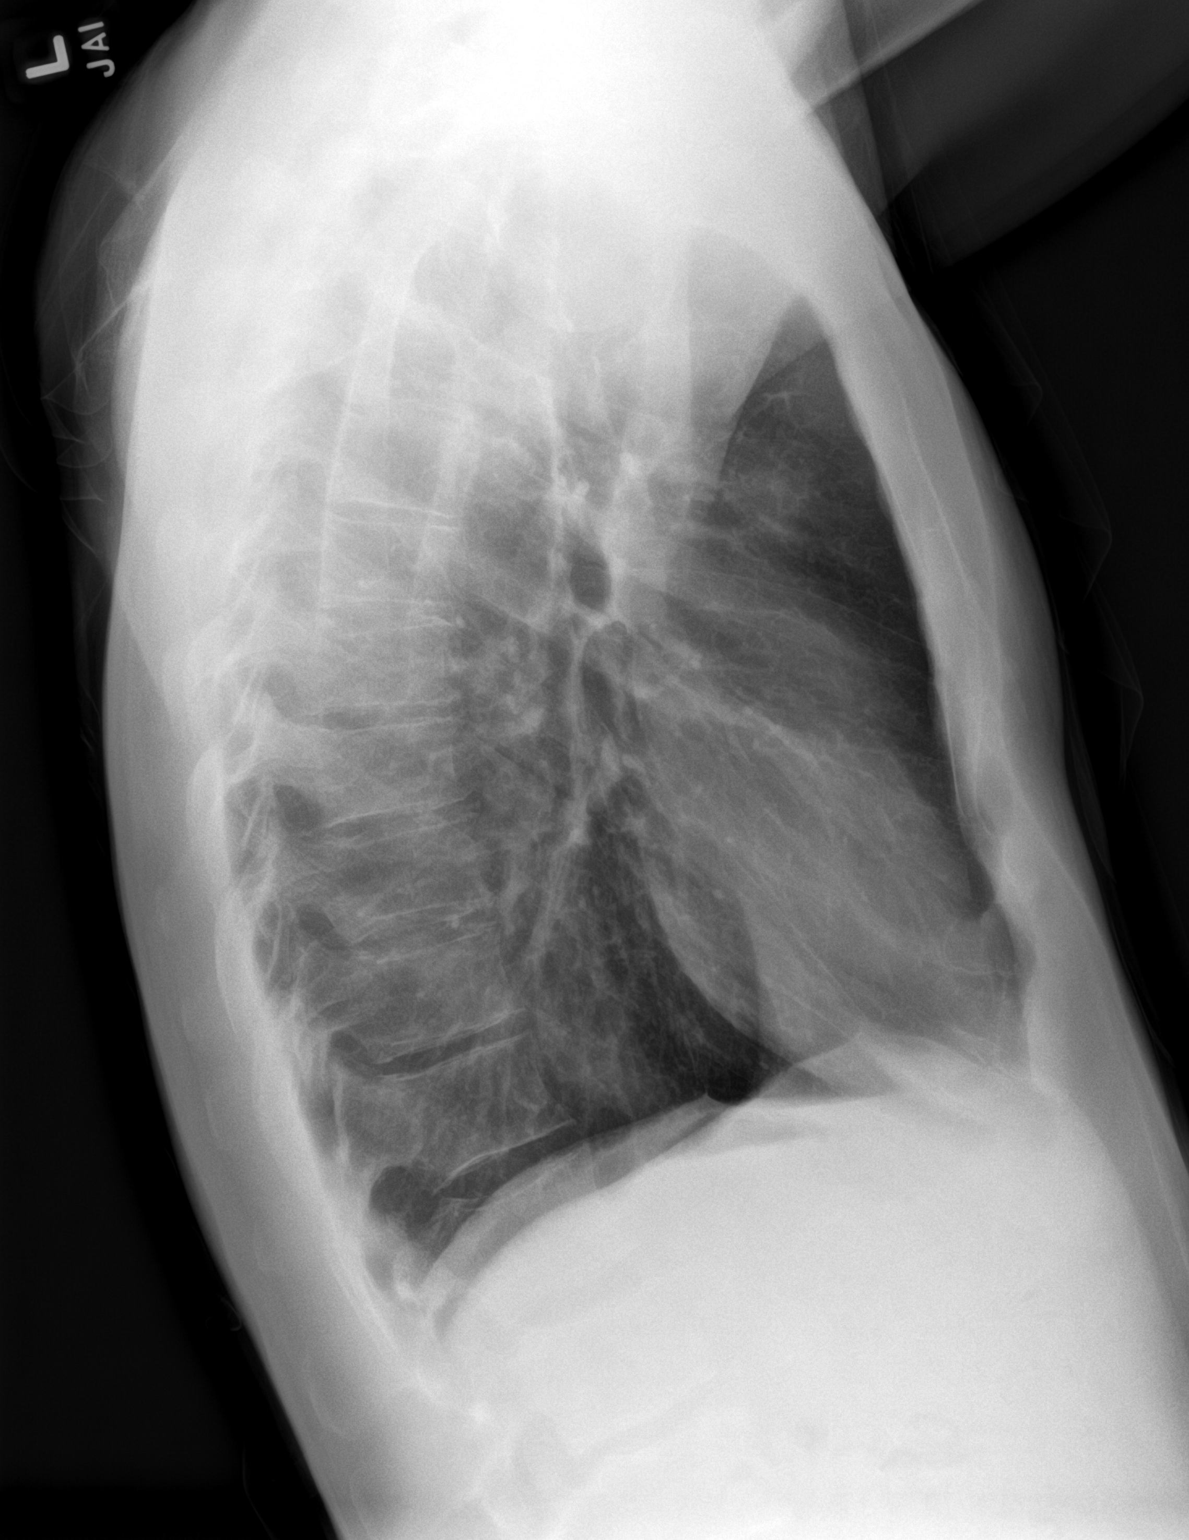

[2 of 2 positions shown; findings below may reference images not displayed]

FINDINGS: Trachea midline.

Cardiomediastinal contours and hilar structures are normal.

Lungs are clear. The lateral aspect of the RIGHT costodiaphragmatic
sulcus is excluded from view. No sign of pleural effusion. On
limited assessment no acute skeletal process.
IMPRESSION: No active cardiopulmonary disease.

## 2022-04-01 DIAGNOSIS — N3010 Interstitial cystitis (chronic) without hematuria: Secondary | ICD-10-CM | POA: Diagnosis not present

## 2022-04-09 ENCOUNTER — Ambulatory Visit (INDEPENDENT_AMBULATORY_CARE_PROVIDER_SITE_OTHER): Payer: BC Managed Care – PPO | Admitting: Internal Medicine

## 2022-04-09 VITALS — BP 112/64 | HR 60 | Temp 97.6°F | Resp 17 | Wt 124.0 lb

## 2022-04-09 DIAGNOSIS — J4540 Moderate persistent asthma, uncomplicated: Secondary | ICD-10-CM

## 2022-04-09 DIAGNOSIS — J302 Other seasonal allergic rhinitis: Secondary | ICD-10-CM

## 2022-04-09 DIAGNOSIS — T781XXD Other adverse food reactions, not elsewhere classified, subsequent encounter: Secondary | ICD-10-CM

## 2022-04-09 DIAGNOSIS — T781XXA Other adverse food reactions, not elsewhere classified, initial encounter: Secondary | ICD-10-CM | POA: Diagnosis not present

## 2022-04-09 DIAGNOSIS — J3089 Other allergic rhinitis: Secondary | ICD-10-CM

## 2022-04-09 NOTE — Progress Notes (Unsigned)
Follow Up Note  RE: Wayne Silva MRN: [id number] DOB: 07-28-1960 Date of Office Visit: 04/09/2022  Referring provider: [name], MD Primary care provider: [name], MD  Chief Complaint: Allergic Reaction (Food allergies)  History of Present Illness: I had the pleasure of seeing Wayne Silva for a follow up visit at the [facility] on 04/10/2022. He is a 61 y.o. male, who is being followed for food intolerance, persistent asthma and allergic rhinitis . His previous allergy office visit was on 03/11/22 with Dr. Marlynn Perking. Today is a skin testing and follow up visit.  History obtained from patient, chart review.  At last visit he was started on Ryaltris   Asthma  -Still having some issues with getting back to normal exercise tolerance -Willing of chest tightness -On Breo 200 mcg's daily denies any missed doses, also on Singulair 10 mg daily -Using albuterol 1 time per week  Rhinitis -Overall improvement since last visit -Improvement in nasal congestion with Ryaltris  Restated in full food panel for evaluation of possible food allergies   Pertinent History/Diagnostics:  - Asthma: persistent asthma, 0 OCS, ED, UC visits in past year, 0 Lifetime hospitalizations/intubations   - normal spirometry (03/11/22): ratio 83, 3.47 FEV1 (pre),  - Allergic Rhinitis:   - SPT environmental panel (06/21/2018): positive to grass pollen, weed pollen, tree pollen, mold   -septorhinoplasty and turbinate reduction on August 19, 2021.  - Food Allergy (peanut, garlic, corn )  - Hx of reaction: feeling of throat irritation, chest tightness and upset stomach with peanut, garlic and corn   - SPT select foods (04/09/22): negative to full food panel    Assessment and Plan: Wayne Silva is a 61 y.o. male with: Moderate persistent asthma without complication  Seasonal and perennial allergic rhinitis  Adverse food reaction, subsequent encounter Plan: Patient Instructions   Persistent asthma:  improved  - Will repeat breathing test at next visit  - We will continue on taking Breo 1 puff daily - Continue to exercise but understand that there is a slow reconditioning process for asthmatics  PLAN:  - Spacer not needed with current regimen. - Daily controller medication(s): Singulair 10mg  daily and Breo 200/58mcg one puff once daily - Prior to physical activity: albuterol 2 puffs 10-15 minutes before physical activity. - Rescue medications: albuterol 4 puffs every 4-6 hours as needed  - Asthma control goals:  * Full participation in all desired activities (may need albuterol before activity) * Albuterol use two time or less a week on average (not counting use with activity) * Cough interfering with sleep two time or less a month * Oral steroids no more than once a year * No hospitalizations  Seasonal and Perennial  Rhinitis improved  - Some inflammation in nose may be due to healing from surgery  - allergen avoidance to grass pollen, weed pollen, tree pollen, mold - consider allergy shots as long term control of your symptoms by teaching your immune system to be more tolerant of your allergy triggers - Continue Ryaltris 2 sprays per nostril twice daily (sample given today); card given for instructions on how to buy OTC  - Continue Singulair (Montelukast) 10mg  nightly. - Consider over the counter antihistamine daily or daily as needed.   -Your options include Zyrtec (Cetirizine) 10mg , Claritin (Loratadine) 10mg , Allegra (Fexofenadine) 180mg , or Xyzal (Levocetirinze) 5mg    Concern for food allergy  - testing today showed negative to all foods  - This may be a food  sensitivity  - Recommend a trial of the low FODMAP diet as a place to start for an elimination diet   [email or web address]   Follow up:  3 months   Thank you so much for letting me partake in your care today.  Don't hesitate to reach out if you have any additional  concerns!  Ferol Luz, MD  [facility] [place] [place]  No follow-ups on file.  No orders of the defined types were placed in this encounter.   Lab Orders  No laboratory test(s) ordered today   Diagnostics:  Skin Testing: Food allergy panel. Negative to all foods  Results interpreted by myself during this encounter and discussed with patient/family.  Food Adult Perc - 04/09/22 1500     Time Antigen Placed 1525    Allergen Manufacturer Waynette Buttery    Location Back    Number of allergen test 72     Control-buffer 50% Glycerol Negative    Control-Histamine 1 mg/ml 4+    1. Peanut Negative    2. Soybean Negative    3. Wheat Negative    4. Sesame Negative    5. Milk, cow Negative    6. Egg White, Chicken Negative    7. Casein Negative    8. Shellfish Mix Negative    9. Fish Mix Negative    10. Cashew Negative    11. Pecan Food Negative    12. Walnut Food Negative    13. Almond Negative    14. Hazelnut Negative    15. [place] nut Negative    16. Coconut Negative    17. Pistachio Negative    18. Catfish Negative    19. Bass Negative    20. Trout Negative    21. Tuna Negative    22. Salmon Negative    23. Flounder Negative    24. Codfish Negative    25. Shrimp Negative    26. Crab Negative    27. Lobster Negative    28. Oyster Negative    29. Scallops Negative    30. Barley Negative    31. Oat  Negative    32. Rye  Negative    33. Hops Negative    34. Rice Negative    35. Cottonseed Negative    36. Saccharomyces Cerevisiae  Negative    37. Pork Negative    38. [place] Meat Negative    39. Chicken Meat Negative    40. Beef Negative    41. Lamb Negative    42. Tomato Negative    43. White Potato Negative    44. Sweet Potato Negative    45. Pea, Green/English Negative    46. Navy Bean Negative    47. Mushrooms Negative    48. Avocado Negative    49. Onion Negative    50. Cabbage Negative    51. Carrots Negative    52. Celery Negative     53. Corn Negative    54. Cucumber Negative    55. Grape (White seedless) Negative    56. Orange  Negative    57. Banana Negative    58. Apple Negative    59. Peach Negative    60. Strawberry Negative    61. Cantaloupe Negative    62. Watermelon Negative    63. Pineapple Negative    64. Chocolate/Cacao bean Negative    65. Karaya Gum Negative    66. Acacia (Arabic Gum) Negative    67. Cinnamon Negative  68. Nutmeg Negative    69. Ginger Negative    70. Garlic Negative    71. Pepper, black Negative    72. Mustard Negative             Medication List:  Current Outpatient Medications  Medication Sig Dispense Refill   azelastine (ASTELIN) 0.1 % nasal spray SPRAY 1 SPRAY INTO EACH NOSTRIL TWICE A DAY 20 mL 2   BREO ELLIPTA 200-25 MCG/ACT AEPB INHALE 1 PUFF BY MOUTH EVERY DAY 180 each 0   levalbuterol (XOPENEX HFA) 45 MCG/ACT inhaler Inhale 1 puff into the lungs every 6 (six) hours as needed for wheezing. 45 g 2   mirtazapine (REMERON) 15 MG tablet Take by mouth.     montelukast (SINGULAIR) 10 MG tablet Take 1 tablet by mouth daily.     Omega-3 Fatty Acids (FISH OIL PO) Take by mouth daily.      tadalafil (CIALIS) 5 MG tablet Take 1 tablet by mouth daily.     triamcinolone (NASACORT) 55 MCG/ACT AERO nasal inhaler Place 2 sprays into the nose daily.     valACYclovir (VALTREX) 1000 MG tablet Take 1 tablet by mouth daily.     No current facility-administered medications for this visit.   Allergies: No Known Allergies I reviewed his past medical history, social history, family history, and environmental history and no significant changes have been reported from his previous visit.  ROS: All others negative except as noted per HPI.   Objective: BP 112/64   Pulse 60   Temp 97.6 F (36.4 C) (Temporal)   Resp 17   Wt 124 lb (56.2 kg)   SpO2 100%   BMI 19.69 kg/m  Body mass index is 19.69 kg/m. General Appearance:  Alert, cooperative, no distress, appears stated age   Head:  Normocephalic, without obvious abnormality, atraumatic  Eyes:  Conjunctiva clear, EOM's intact  Nose: Nares normal,   Throat: Lips, tongue normal; teeth and gums normal,   Neck: Supple, symmetrical  Lungs:   , Respirations unlabored, no coughing  Heart:  , Appears well perfused  Extremities: No edema  Skin: Skin color, texture, turgor normal, no rashes or lesions on visualized portions of skin   Neurologic: No gross deficits   Previous notes and tests were reviewed. The plan was reviewed with the patient/family, and all questions/concerned were addressed.  It was my pleasure to see Wayne Silva today and participate in his care. Please feel free to contact me with any questions or concerns.  Sincerely,  Ferol Luz, MD  Allergy & Immunology  [facility] Office: [phone]

## 2022-04-09 NOTE — Patient Instructions (Addendum)
Persistent asthma:  improved  - Will repeat breathing test at next visit  - We will continue on taking Breo 1 puff daily - Continue to exercise but understand that there is a slow reconditioning process for asthmatics  PLAN:  - Spacer not needed with current regimen. - Daily controller medication(s): Singulair 10mg  daily and Breo 200/51mcg one puff once daily - Prior to physical activity: albuterol 2 puffs 10-15 minutes before physical activity. - Rescue medications: albuterol 4 puffs every 4-6 hours as needed  - Asthma control goals:  * Full participation in all desired activities (may need albuterol before activity) * Albuterol use two time or less a week on average (not counting use with activity) * Cough interfering with sleep two time or less a month * Oral steroids no more than once a year * No hospitalizations  Seasonal and Perennial  Rhinitis improved  - Some inflammation in nose may be due to healing from surgery  - allergen avoidance to grass pollen, weed pollen, tree pollen, mold - consider allergy shots as long term control of your symptoms by teaching your immune system to be more tolerant of your allergy triggers - Continue Ryaltris 2 sprays per nostril twice daily (sample given today); card given for instructions on how to buy OTC  - Continue Singulair (Montelukast) 10mg  nightly. - Consider over the counter antihistamine daily or daily as needed.   -Your options include Zyrtec (Cetirizine) 10mg , Claritin (Loratadine) 10mg , Allegra (Fexofenadine) 180mg , or Xyzal (Levocetirinze) 5mg    Concern for food allergy  - testing today showed negative to all foods  - This may be a food sensitivity  - Recommend a trial of the low FODMAP diet as a place to start for an elimination diet   32m   Follow up:  3 months   Thank you so much for letting me partake in your care today.  Don't hesitate to reach out if you have any additional  concerns!  , MD  [facility] [place] [place]

## 2022-04-10 ENCOUNTER — Encounter: Payer: Self-pay | Admitting: Gastroenterology

## 2022-05-04 ENCOUNTER — Other Ambulatory Visit: Payer: Self-pay | Admitting: Internal Medicine

## 2022-05-04 DIAGNOSIS — J4520 Mild intermittent asthma, uncomplicated: Secondary | ICD-10-CM

## 2022-05-13 ENCOUNTER — Other Ambulatory Visit: Payer: Self-pay

## 2022-05-14 ENCOUNTER — Other Ambulatory Visit: Payer: Self-pay

## 2022-05-14 MED ORDER — RYALTRIS 665-25 MCG/ACT NA SUSP: NASAL | 5 refills | Status: DC

## 2022-05-14 NOTE — Telephone Encounter (Signed)
Left patient a sample of rylatris nasal spray at the front desk and sent in a prescription to [facility] [place].

## 2022-05-14 NOTE — Telephone Encounter (Signed)
Sent ryaltris to [facility]. Did leave sample at front desk for patient to pick up.

## 2022-05-19 ENCOUNTER — Ambulatory Visit (INDEPENDENT_AMBULATORY_CARE_PROVIDER_SITE_OTHER): Payer: BC Managed Care – PPO

## 2022-05-19 DIAGNOSIS — Z23 Encounter for immunization: Secondary | ICD-10-CM | POA: Diagnosis not present

## 2022-05-19 NOTE — Progress Notes (Signed)
After obtaining consent, and per orders of Dr. Ronnald Ramp, injection of Shingrix given by Marrian Salvage. Patient instructed to report any adverse reaction to me immediately.

## 2022-07-07 ENCOUNTER — Encounter: Payer: Self-pay | Admitting: Internal Medicine

## 2022-07-07 ENCOUNTER — Telehealth: Payer: Self-pay | Admitting: Internal Medicine

## 2022-07-07 ENCOUNTER — Ambulatory Visit (INDEPENDENT_AMBULATORY_CARE_PROVIDER_SITE_OTHER): Payer: BC Managed Care – PPO | Admitting: Internal Medicine

## 2022-07-07 VITALS — BP 110/66 | HR 55 | Resp 16 | Ht 67.0 in

## 2022-07-07 DIAGNOSIS — T781XXD Other adverse food reactions, not elsewhere classified, subsequent encounter: Secondary | ICD-10-CM | POA: Diagnosis not present

## 2022-07-07 DIAGNOSIS — J3089 Other allergic rhinitis: Secondary | ICD-10-CM | POA: Diagnosis not present

## 2022-07-07 DIAGNOSIS — J343 Hypertrophy of nasal turbinates: Secondary | ICD-10-CM

## 2022-07-07 DIAGNOSIS — J302 Other seasonal allergic rhinitis: Secondary | ICD-10-CM

## 2022-07-07 DIAGNOSIS — H1045 Other chronic allergic conjunctivitis: Secondary | ICD-10-CM

## 2022-07-07 DIAGNOSIS — J4540 Moderate persistent asthma, uncomplicated: Secondary | ICD-10-CM

## 2022-07-07 DIAGNOSIS — J4520 Mild intermittent asthma, uncomplicated: Secondary | ICD-10-CM

## 2022-07-07 MED ORDER — BREO ELLIPTA 200-25 MCG/ACT IN AEPB: INHALATION_SPRAY | RESPIRATORY_TRACT | 0 refills | Status: DC

## 2022-07-07 MED ORDER — ALBUTEROL SULFATE HFA 108 (90 BASE) MCG/ACT IN AERS: 2.0000 | INHALATION_SPRAY | RESPIRATORY_TRACT | 1 refills | Status: DC | PRN

## 2022-07-07 NOTE — Telephone Encounter (Signed)
Patient called and asked if his albuterol can be changed to other medication that was talked about during his appointment today he stated it started with an L but didn't remember the name of the medication. Call back number [phone].

## 2022-07-07 NOTE — Telephone Encounter (Signed)
Fine to swithc.  Thanks!

## 2022-07-07 NOTE — Progress Notes (Signed)
Follow Up Note  RE: Wayne Silva MRN: [id number] DOB: [birth year] Date of Office Visit: 07/07/2022  Referring provider: Janith Lima, MD Primary care provider: Janith Lima, MD  Chief Complaint: Asthma  History of Present Illness: I had the pleasure of seeing Wayne Silva for a follow up visit at the [facility] on 07/07/2022. He is a 62 y.o. male, who is being followed for persistent asthma, allergic rhiniits, food intolerance. His previous allergy office visit was on 04/09/22 with Dr. Edison Pace. Today is a regular follow up visit.  History obtained from patient, chart review  Today he reports .  Asthma is well controlled with Breo.  Exercise is slowly improving.  He had a period of chest tightness about a month ago which required albuterol use, but since then he hasn't needed it.   He continues to avoid garlic, corn due to persistent food intolerance symptoms.  Still avoiding peanuts.   Rhinnorhe and sneezing are well controlled with singulair and ryaltris  He still has difficulty with congestion which has been persistent since septorhinoplasty and turbinate reduction in April.  Plan to follow up with ENT   Pertinent History/Diagnostics:  - Asthma: persistent asthma, exercise main trigger, -0 OCS, ED, UC visits in past year, 0 hospitalizations/intubations              - normal spirometry (03/11/22): ratio 83, 3.47 FEV1 (pre),  - Allergic Rhinitis:              - SPT environmental panel (06/21/2018): positive to grass pollen, weed pollen, tree pollen, mold              -septorhinoplasty and turbinate reduction on August 19, 2021.  - Food Allergy (peanut, garlic, corn )             - Hx of reaction: feeling of throat irritation, chest tightness and upset  stomach with peanut, garlic and corn              - SPT select foods (04/09/22): negative to full food panel   - Low FODMAP diet recommended     Assessment and Plan: Davon is a 62 y.o. male  with: Seasonal and perennial allergic rhinitis  Moderate persistent asthma without complication  Adverse food reaction, subsequent encounter  Other chronic allergic conjunctivitis of both eyes  Mild intermittent asthma without complication  Nasal turbinate hypertrophy   Plan: Patient Instructions  Persistent asthma:  well controlled -Breathing tests today showed looked great!  - We will continue current therapy and if well controlled at follow up, consider stepping down breo   PLAN:  - Spacer not needed with current regimen. - Daily controller medication(s): Singulair '10mg'$  daily and Breo 200/60mg one puff once daily - Prior to physical activity: albuterol 2 puffs 10-15 minutes before physical activity. - Rescue medications: albuterol 4 puffs every 4-6 hours as needed  - Asthma control goals:  * Full participation in all desired activities (may need albuterol before activity) * Albuterol use two time or less a week on average (not counting use with activity) * Cough interfering with sleep two time or less a month * Oral steroids no more than once a year * No hospitalizations  Seasonal and Perennial  Rhinitis: with persistent symptoms - on exam you still have signficant turbinate hypertrophy   -recommend following back with ENT as part of your surgery was to reduce the size of the turbinates  - allergen avoidance to grass  pollen, weed pollen, tree pollen, mold - consider allergy shots as long term control of your symptoms by teaching your immune system to be more tolerant of your allergy triggers - Continue Ryaltris 2 sprays per nostril twice daily (this will have the turbinate swelling and allergic symptoms)  - Continue Singulair (Montelukast) '10mg'$  nightly. - Consider over the counter antihistamine daily or daily as needed.   -Your options include Zyrtec (Cetirizine) '10mg'$ , Claritin (Loratadine) '10mg'$ , Allegra (Fexofenadine) '180mg'$ , or Xyzal (Levocetirinze) '5mg'$    Food  Intolerance  - Continue low FODMAP diet  - avoid trigger foods as needed  - No indication for epipen   Follow up: 9 months   Thank you so much for letting me partake in your care today.  Don't hesitate to reach out if you have any additional concerns!  Roney Marion, MD  [facility] [place] [place]    Follow up:  3 months   Thank you so much for letting me partake in your care today.  Don't hesitate to reach out if you have any additional concerns!  Roney Marion, MD  [facility] [place] [place]   No orders of the defined types were placed in this encounter.   Lab Orders  No laboratory test(s) ordered today   Diagnostics: Spirometry:  Tracings reviewed. His effort: Good reproducible efforts. FVC: 3.64 L FEV1: 2.77 L, 86% predicted FEV1/FVC ratio: 76% Interpretation: Spirometry consistent with normal pattern.  Please see scanned spirometry results for details.   Results interpreted by myself during this encounter and discussed with patient/family.   Medication List:  Current Outpatient Medications  Medication Sig Dispense Refill   azelastine (ASTELIN) 0.1 % nasal spray SPRAY 1 SPRAY INTO EACH NOSTRIL TWICE A DAY 20 mL 2   BREO ELLIPTA 200-25 MCG/ACT AEPB INHALE 1 PUFF BY MOUTH EVERY DAY 180 each 0   levalbuterol (XOPENEX HFA) 45 MCG/ACT inhaler Inhale 1 puff into the lungs every 6 (six) hours as needed for wheezing. 45 g 2   mirtazapine (REMERON) 15 MG tablet Take by mouth.     montelukast (SINGULAIR) 10 MG tablet Take 1 tablet by mouth daily.     Olopatadine-Mometasone (RYALTRIS) [id number] MCG/ACT SUSP 2 sprays per nostril twice daily for stuffy nose 29 g 5   Olopatadine-Mometasone (RYALTRIS) 665-25 MCG/ACT SUSP 2 sprays per nostril twice daily 29 g 5   Omega-3 Fatty Acids (FISH OIL PO) Take by mouth daily.      tadalafil (CIALIS) 5 MG tablet Take 1 tablet by mouth daily.     triamcinolone (NASACORT) 55 MCG/ACT AERO nasal inhaler  Place 2 sprays into the nose daily.     valACYclovir (VALTREX) 1000 MG tablet Take 1 tablet by mouth daily.     No current facility-administered medications for this visit.   Allergies: No Known Allergies I reviewed his past medical history, social history, family history, and environmental history and no significant changes have been reported from his previous visit.  ROS: All others negative except as noted per HPI.   Objective: BP 110/66   Pulse (!) 55   Resp 16   Ht '5\' 7"'$  (1.702 m)   SpO2 97%   BMI 19.42 kg/m  Body mass index is 19.42 kg/m. General Appearance:  Alert, cooperative, no distress, appears stated age  Head:  Normocephalic, without obvious abnormality, atraumatic  Eyes:  Conjunctiva clear, EOM's intact  Nose: Nares normal,  obstruction middle turbinates, clear rhinorrhea, hypertrophic turbinates, no visible anterior polyps, and  septum midline  Throat: Lips, tongue normal; teeth and gums normal,   Neck: Supple, symmetrical  Lungs:   clear to auscultation bilaterally, Respirations unlabored, no coughing  Heart:  regular rate and rhythm and no murmur, Appears well perfused  Extremities: No edema  Skin: Skin color, texture, turgor normal, no rashes or lesions on visualized portions of skin  Neurologic: No gross deficits   Previous notes and tests were reviewed. The plan was reviewed with the patient/family, and all questions/concerned were addressed.  It was my pleasure to see Erikson today and participate in his care. Please feel free to contact me with any questions or concerns.  Sincerely,  Roney Marion, MD  Allergy & Immunology  [facility] Office: [phone]

## 2022-07-07 NOTE — Patient Instructions (Addendum)
Persistent asthma:  well controlled -Breathing tests today showed looked great!  - We will continue current therapy and if well controlled at follow up, consider stepping down breo   PLAN:  - Spacer not needed with current regimen. - Daily controller medication(s): Singulair '10mg'$  daily and Breo 200/32mg one puff once daily - Prior to physical activity: albuterol 2 puffs 10-15 minutes before physical activity. - Rescue medications: albuterol 4 puffs every 4-6 hours as needed  - Asthma control goals:  * Full participation in all desired activities (may need albuterol before activity) * Albuterol use two time or less a week on average (not counting use with activity) * Cough interfering with sleep two time or less a month * Oral steroids no more than once a year * No hospitalizations  Seasonal and Perennial  Rhinitis: with persistent symptoms - on exam you still have signficant turbinate hypertrophy   -recommend following back with ENT as part of your surgery was to reduce the size of the turbinates  - allergen avoidance to grass pollen, weed pollen, tree pollen, mold - consider allergy shots as long term control of your symptoms by teaching your immune system to be more tolerant of your allergy triggers - Continue Ryaltris 2 sprays per nostril twice daily (this will have the turbinate swelling and allergic symptoms)  - Continue Singulair (Montelukast) '10mg'$  nightly. - Consider over the counter antihistamine daily or daily as needed.   -Your options include Zyrtec (Cetirizine) '10mg'$ , Claritin (Loratadine) '10mg'$ , Allegra (Fexofenadine) '180mg'$ , or Xyzal (Levocetirinze) '5mg'$    Food Intolerance  - Continue low FODMAP diet  - avoid trigger foods as needed  - No indication for epipen   Follow up: 9 months   Thank you so much for letting me partake in your care today.  Don't hesitate to reach out if you have any additional concerns!  ERoney Marion MD  [facility] [place] [place]    Follow up:  3 months   Thank you so much for letting me partake in your care today.  Don't hesitate to reach out if you have any additional concerns!  ERoney Marion MD  [facility] [place]

## 2022-07-07 NOTE — Telephone Encounter (Signed)
Okay to switch to Levalbuterol?

## 2022-07-10 MED ORDER — LEVALBUTEROL TARTRATE 45 MCG/ACT IN AERO: 2.0000 | INHALATION_SPRAY | RESPIRATORY_TRACT | 1 refills | Status: DC | PRN

## 2022-07-10 NOTE — Telephone Encounter (Addendum)
I will call pharmacy later to check coverage

## 2022-07-10 NOTE — Telephone Encounter (Signed)
Rx sent 

## 2022-07-11 DIAGNOSIS — R3589 Other polyuria: Secondary | ICD-10-CM | POA: Diagnosis not present

## 2022-07-11 DIAGNOSIS — N401 Enlarged prostate with lower urinary tract symptoms: Secondary | ICD-10-CM | POA: Diagnosis not present

## 2022-07-11 DIAGNOSIS — N138 Other obstructive and reflux uropathy: Secondary | ICD-10-CM | POA: Diagnosis not present

## 2022-07-24 DIAGNOSIS — Z09 Encounter for follow-up examination after completed treatment for conditions other than malignant neoplasm: Secondary | ICD-10-CM | POA: Diagnosis not present

## 2022-07-24 DIAGNOSIS — R310 Gross hematuria: Secondary | ICD-10-CM | POA: Diagnosis not present

## 2022-07-24 DIAGNOSIS — Z8709 Personal history of other diseases of the respiratory system: Secondary | ICD-10-CM | POA: Diagnosis not present

## 2022-07-24 DIAGNOSIS — Z8739 Personal history of other diseases of the musculoskeletal system and connective tissue: Secondary | ICD-10-CM | POA: Diagnosis not present

## 2022-08-07 DIAGNOSIS — N200 Calculus of kidney: Secondary | ICD-10-CM | POA: Diagnosis not present

## 2022-08-07 DIAGNOSIS — N3010 Interstitial cystitis (chronic) without hematuria: Secondary | ICD-10-CM | POA: Diagnosis not present

## 2022-09-03 DIAGNOSIS — N3010 Interstitial cystitis (chronic) without hematuria: Secondary | ICD-10-CM | POA: Diagnosis not present

## 2022-09-07 ENCOUNTER — Other Ambulatory Visit: Payer: Self-pay | Admitting: Internal Medicine

## 2022-10-15 ENCOUNTER — Other Ambulatory Visit: Payer: Self-pay | Admitting: Internal Medicine

## 2022-10-15 DIAGNOSIS — J4520 Mild intermittent asthma, uncomplicated: Secondary | ICD-10-CM

## 2022-11-07 DIAGNOSIS — N3010 Interstitial cystitis (chronic) without hematuria: Secondary | ICD-10-CM | POA: Diagnosis not present

## 2022-11-16 ENCOUNTER — Other Ambulatory Visit: Payer: Self-pay | Admitting: Internal Medicine

## 2023-01-15 ENCOUNTER — Other Ambulatory Visit: Payer: Self-pay | Admitting: Internal Medicine

## 2023-01-15 DIAGNOSIS — J4520 Mild intermittent asthma, uncomplicated: Secondary | ICD-10-CM

## 2023-01-15 NOTE — Telephone Encounter (Signed)
Pt needs appt. Haven't been seen since February. Rx denied pt was called to make appt.

## 2023-01-19 ENCOUNTER — Other Ambulatory Visit: Payer: Self-pay | Admitting: Internal Medicine

## 2023-01-19 ENCOUNTER — Telehealth: Payer: Self-pay | Admitting: Internal Medicine

## 2023-01-19 DIAGNOSIS — F411 Generalized anxiety disorder: Secondary | ICD-10-CM

## 2023-01-19 MED ORDER — MIRTAZAPINE 15 MG PO TABS: 15.0000 mg | ORAL_TABLET | Freq: Every day | ORAL | 0 refills | Status: DC

## 2023-01-19 NOTE — Telephone Encounter (Signed)
Prescription Request  01/19/2023  LOV: Visit date not found  Pt coming in October and requesting about this medication let pt  know Dr. Yetta Barre may not refill until appt and I also  notice that someone else prescribe the medication.   What is the name of the medication or equipment?  mirtazapine (REMERON) 15 MG tablet  Have you contacted your pharmacy to request a refill? No   Which pharmacy would you like this sent to?    [id number] [place], [place] - [address] [place] [address] Phone: [phone] Fax: [phone]  Patient notified that their request is being sent to the clinical staff for review and that they should receive a response within 2 business days.   Please advise at Mobile [phone] (mobile)

## 2023-02-13 ENCOUNTER — Other Ambulatory Visit: Payer: Self-pay | Admitting: Internal Medicine

## 2023-02-13 DIAGNOSIS — F411 Generalized anxiety disorder: Secondary | ICD-10-CM

## 2023-03-03 ENCOUNTER — Encounter: Payer: Self-pay | Admitting: Internal Medicine

## 2023-03-03 ENCOUNTER — Ambulatory Visit (INDEPENDENT_AMBULATORY_CARE_PROVIDER_SITE_OTHER): Payer: BC Managed Care – PPO | Admitting: Internal Medicine

## 2023-03-03 VITALS — BP 110/80 | HR 63 | Temp 98.3°F | Ht 67.0 in | Wt 132.2 lb

## 2023-03-03 DIAGNOSIS — N3010 Interstitial cystitis (chronic) without hematuria: Secondary | ICD-10-CM

## 2023-03-03 DIAGNOSIS — J4540 Moderate persistent asthma, uncomplicated: Secondary | ICD-10-CM | POA: Diagnosis not present

## 2023-03-03 DIAGNOSIS — K219 Gastro-esophageal reflux disease without esophagitis: Secondary | ICD-10-CM

## 2023-03-03 DIAGNOSIS — K4090 Unilateral inguinal hernia, without obstruction or gangrene, not specified as recurrent: Secondary | ICD-10-CM

## 2023-03-03 DIAGNOSIS — J301 Allergic rhinitis due to pollen: Secondary | ICD-10-CM

## 2023-03-03 DIAGNOSIS — Z0001 Encounter for general adult medical examination with abnormal findings: Secondary | ICD-10-CM | POA: Diagnosis not present

## 2023-03-03 DIAGNOSIS — Z1211 Encounter for screening for malignant neoplasm of colon: Secondary | ICD-10-CM

## 2023-03-03 DIAGNOSIS — R001 Bradycardia, unspecified: Secondary | ICD-10-CM | POA: Diagnosis not present

## 2023-03-03 LAB — HEPATIC FUNCTION PANEL
ALT: 14 U/L (ref 0–53)
AST: 19 U/L (ref 0–37)
Albumin: 4.2 g/dL (ref 3.5–5.2)
Alkaline Phosphatase: 55 U/L (ref 39–117)
Bilirubin, Direct: 0.1 mg/dL (ref 0.0–0.3)
Total Bilirubin: 0.6 mg/dL (ref 0.2–1.2)
Total Protein: 6.5 g/dL (ref 6.0–8.3)

## 2023-03-03 LAB — CBC WITH DIFFERENTIAL/PLATELET
Basophils Absolute: 0.1 10*3/uL (ref 0.0–0.1)
Basophils Relative: 1 % (ref 0.0–3.0)
Eosinophils Absolute: 0.1 10*3/uL (ref 0.0–0.7)
Eosinophils Relative: 2.1 % (ref 0.0–5.0)
HCT: 42.7 % (ref 39.0–52.0)
Hemoglobin: 14 g/dL (ref 13.0–17.0)
Lymphocytes Relative: 19.7 % (ref 12.0–46.0)
Lymphs Abs: 1.3 10*3/uL (ref 0.7–4.0)
MCHC: 32.8 g/dL (ref 30.0–36.0)
MCV: 93.5 fL (ref 78.0–100.0)
Monocytes Absolute: 0.5 10*3/uL (ref 0.1–1.0)
Monocytes Relative: 7.3 % (ref 3.0–12.0)
Neutro Abs: 4.7 10*3/uL (ref 1.4–7.7)
Neutrophils Relative %: 69.9 % (ref 43.0–77.0)
Platelets: 198 10*3/uL (ref 150.0–400.0)
RBC: 4.56 Mil/uL (ref 4.22–5.81)
RDW: 12.8 % (ref 11.5–15.5)
WBC: 6.7 10*3/uL (ref 4.0–10.5)

## 2023-03-03 LAB — URINALYSIS, ROUTINE W REFLEX MICROSCOPIC
Bilirubin Urine: NEGATIVE
Hgb urine dipstick: NEGATIVE
Ketones, ur: NEGATIVE
Leukocytes,Ua: NEGATIVE
Nitrite: NEGATIVE
RBC / HPF: NONE SEEN (ref 0–?)
Specific Gravity, Urine: 1.025 (ref 1.000–1.030)
Total Protein, Urine: NEGATIVE
Urine Glucose: NEGATIVE
Urobilinogen, UA: 0.2 (ref 0.0–1.0)
pH: 6.5 (ref 5.0–8.0)

## 2023-03-03 LAB — BASIC METABOLIC PANEL
BUN: 12 mg/dL (ref 6–23)
CO2: 30 meq/L (ref 19–32)
Calcium: 9.1 mg/dL (ref 8.4–10.5)
Chloride: 105 meq/L (ref 96–112)
Creatinine, Ser: 0.82 mg/dL (ref 0.40–1.50)
GFR: 94.76 mL/min (ref >60.00)
Glucose, Bld: 82 mg/dL (ref 70–99)
Potassium: 3.9 meq/L (ref 3.5–5.1)
Sodium: 142 meq/L (ref 135–145)

## 2023-03-03 LAB — LIPID PANEL
Cholesterol: 210 mg/dL — ABNORMAL HIGH (ref 0–200)
HDL: 78.4 mg/dL (ref >39.00)
LDL Cholesterol: 122 mg/dL — ABNORMAL HIGH (ref 0–99)
NonHDL: 131.92
Total CHOL/HDL Ratio: 3
Triglycerides: 48 mg/dL (ref 0.0–149.0)
VLDL: 9.6 mg/dL (ref 0.0–40.0)

## 2023-03-03 LAB — TSH: TSH: 2.86 u[IU]/mL (ref 0.35–5.50)

## 2023-03-03 LAB — PSA: PSA: 1.56 ng/mL (ref 0.10–4.00)

## 2023-03-03 MED ORDER — CETIRIZINE-PSEUDOEPHEDRINE ER 5-120 MG PO TB12: 1.0000 | ORAL_TABLET | Freq: Two times a day (BID) | ORAL | 0 refills | Status: DC

## 2023-03-03 MED ORDER — AIRSUPRA 90-80 MCG/ACT IN AERO: 2.0000 | INHALATION_SPRAY | Freq: Four times a day (QID) | RESPIRATORY_TRACT | 1 refills | Status: DC | PRN

## 2023-03-03 NOTE — Progress Notes (Signed)
Subjective:  Patient ID: Wayne Silva, male    DOB: July 08, 1960  Age: 62 y.o. MRN: [id number]  CC: Annual Exam   HPI Trystan Valerio presents for a CPX and f/up -----  Discussed the use of AI scribe software for clinical note transcription with the patient, who gave verbal consent to proceed.  History of Present Illness   The patient, with a history of asthma, has been managing his symptoms with Singular and Breo. He also uses a nasal spray and a saline solution for nasal congestion. He has noticed an increase in the frequency of using his emergency inhaler due to episodes of chest pain and shortness of breath, which he attributes to his asthma.  In addition to his respiratory symptoms, the patient has been experiencing urinary symptoms. He has noticed a discoloration in his urine, which he suspects might be blood. This was investigated by a urologist approximately six to seven months ago, with one instance of blood detected in the urine. However, subsequent tests have been negative.  The patient also reported a transient pain in his groin area about a month ago, which he has not felt since. He wondered if this could be a hernia, but did not observe any bulge indicative of one.  The patient has been proactive in his health, having received his flu and COVID-19 vaccinations. He has been trying to stay active but acknowledges that he could benefit from more exercise.       Outpatient Medications Prior to Visit  Medication Sig Dispense Refill   BREO ELLIPTA 200-25 MCG/ACT AEPB INHALE 1 PUFF BY MOUTH EVERY DAY 180 each 0   mirtazapine (REMERON) 15 MG tablet TAKE 1 TABLET BY MOUTH EVERYDAY AT BEDTIME 30 tablet 0   montelukast (SINGULAIR) 10 MG tablet Take 1 tablet by mouth daily.     Olopatadine-Mometasone (RYALTRIS) [id number] MCG/ACT SUSP 2 sprays per nostril twice daily 29 g 5   albuterol (VENTOLIN HFA) 108 (90 Base) MCG/ACT inhaler INHALE 2 PUFFS INTO THE LUNGS UP TO EVERY 4 HOURS AS NEEDED FOR  WHEEZING/SHORTNESS OF BREATH 18 each 1   azelastine (ASTELIN) 0.1 % nasal spray SPRAY 1 SPRAY INTO EACH NOSTRIL TWICE A DAY 20 mL 2   Olopatadine-Mometasone (RYALTRIS) 665-25 MCG/ACT SUSP 2 sprays per nostril twice daily for stuffy nose 29 g 5   tadalafil (CIALIS) 5 MG tablet Take 1 tablet by mouth daily.     valACYclovir (VALTREX) 1000 MG tablet Take 1 tablet by mouth daily.     levalbuterol (XOPENEX HFA) 45 MCG/ACT inhaler Inhale 1 puff into the lungs every 6 (six) hours as needed for wheezing. 45 g 2   levalbuterol (XOPENEX HFA) 45 MCG/ACT inhaler Inhale 2 puffs into the lungs every 4 (four) hours as needed for wheezing. 15 g 1   Omega-3 Fatty Acids (FISH OIL PO) Take by mouth daily.  (Patient not taking: Reported on 03/03/2023)     triamcinolone (NASACORT) 55 MCG/ACT AERO nasal inhaler Place 2 sprays into the nose daily.     No facility-administered medications prior to visit.    ROS Review of Systems  Constitutional:  Negative for appetite change, diaphoresis and fatigue.  HENT:  Positive for congestion. Negative for nosebleeds, postnasal drip, rhinorrhea, sinus pressure and trouble swallowing.   Eyes: Negative.   Respiratory:  Negative for cough, chest tightness, shortness of breath and wheezing.   Cardiovascular:  Negative for chest pain, palpitations and leg swelling.  Gastrointestinal:  Negative for abdominal pain, constipation, diarrhea, nausea  and vomiting.  Endocrine: Negative.   Genitourinary: Negative.  Negative for difficulty urinating.  Musculoskeletal: Negative.  Negative for arthralgias.  Skin: Negative.  Negative for color change.  Neurological: Negative.  Negative for dizziness and weakness.  Hematological:  Negative for adenopathy. Does not bruise/bleed easily.  Psychiatric/Behavioral:  The patient is nervous/anxious.     Objective:  BP 110/80 (BP Location: Left Arm, Patient Position: Sitting, Cuff Size: Normal)   Pulse 63   Temp 98.3 F (36.8 C) (Oral)   Ht 5'  7" (1.702 m)   Wt 132 lb 3.2 oz (60 kg)   SpO2 94%   BMI 20.71 kg/m   BP Readings from Last 3 Encounters:  03/03/23 110/80  07/07/22 110/66  04/09/22 112/64    Wt Readings from Last 3 Encounters:  03/03/23 132 lb 3.2 oz (60 kg)  04/09/22 124 lb (56.2 kg)  03/11/22 125 lb 4.8 oz (56.8 kg)    Physical Exam Vitals reviewed.  Constitutional:      Appearance: Normal appearance.  HENT:     Mouth/Throat:     Mouth: Mucous membranes are moist.  Eyes:     General: No scleral icterus.    Conjunctiva/sclera: Conjunctivae normal.  Cardiovascular:     Rate and Rhythm: Bradycardia present.     Heart sounds: No murmur heard.    No gallop.     Comments: EKG--- SB, 56 bpm No LVH, Q waves, or ST/T waves  Pulmonary:     Effort: Pulmonary effort is normal.     Breath sounds: No stridor. No wheezing, rhonchi or rales.  Abdominal:     General: Abdomen is flat.     Palpations: There is no mass.     Tenderness: There is no abdominal tenderness. There is no guarding or rebound.     Hernia: A hernia is present. Hernia is present in the left inguinal area. There is no hernia in the right inguinal area.  Genitourinary:    Pubic Area: No rash.      Penis: Normal and circumcised.      Testes: Normal.     Epididymis:     Right: Normal.     Left: Normal.     Prostate: Enlarged. Not tender and no nodules present.     Rectum: Normal. Guaiac result negative. No mass, tenderness or anal fissure. Normal anal tone.     Comments: Left DIH that appears only with valsalva maneuver Musculoskeletal:        General: Normal range of motion.     Cervical back: Neck supple.     Right lower leg: No edema.     Left lower leg: No edema.  Lymphadenopathy:     Cervical: No cervical adenopathy.     Lower Body: No right inguinal adenopathy. No left inguinal adenopathy.  Skin:    General: Skin is warm and dry.  Neurological:     General: No focal deficit present.     Mental Status: He is alert. Mental  status is at baseline.  Psychiatric:        Mood and Affect: Mood normal.        Behavior: Behavior normal.     Lab Results  Component Value Date   WBC 6.7 03/03/2023   HGB 14.0 03/03/2023   HCT 42.7 03/03/2023   PLT 198.0 03/03/2023   GLUCOSE 82 03/03/2023   CHOL 210 (H) 03/03/2023   TRIG 48.0 03/03/2023   HDL 78.40 03/03/2023   LDLCALC 122 (H) 03/03/2023  ALT 14 03/03/2023   AST 19 03/03/2023   NA 142 03/03/2023   K 3.9 03/03/2023   CL 105 03/03/2023   CREATININE 0.82 03/03/2023   BUN 12 03/03/2023   CO2 30 03/03/2023   TSH 2.86 03/03/2023   PSA 1.56 03/03/2023   HGBA1C 5.7 01/09/2021    DG Chest 2 View  Result Date: 06/08/2018 CLINICAL DATA:  Cough and wheezing for 3 days.  Former smoker. EXAM: CHEST - 2 VIEW COMPARISON:  05/01/2014 FINDINGS: The heart size and mediastinal contours are within normal limits. Both lungs are clear. The visualized skeletal structures are unremarkable. IMPRESSION: No active cardiopulmonary disease. Electronically Signed   By: Myles Rosenthal M.D.   On: 06/08/2018 08:25    Assessment & Plan:  Moderate persistent asthma without complication -     Airsupra; Inhale 2 puffs into the lungs 4 (four) times daily as needed.  Dispense: 32.1 g; Refill: 1 -     Hepatic function panel; Future -     CBC with Differential/Platelet; Future -     Basic metabolic panel; Future  IC (interstitial cystitis)-his UA is normal. -     Urinalysis, Routine w reflex microscopic; Future -     Basic metabolic panel; Future  Gastroesophageal reflux disease without esophagitis -     Hepatic function panel; Future -     CBC with Differential/Platelet; Future -     Basic metabolic panel; Future  Colon cancer screening -     Ambulatory referral to Gastroenterology  Bradycardia on ECG-he is asymptomatic with this.  Labs are negative for secondary causes. -     TSH; Future -     Hepatic function panel; Future -     Basic metabolic panel; Future -     EKG  12-Lead  Encounter for general adult medical examination with abnormal findings - Exam completed, labs reviewed, vaccines reviewed, cancer screenings addressed, pt ed material was given.  -     Lipid panel; Future -     PSA; Future  Seasonal allergic rhinitis due to pollen -     Cetirizine-Pseudoephedrine ER; Take 1 tablet by mouth 2 (two) times daily.  Dispense: 180 tablet; Refill: 0  Direct left inguinal hernia -     Ambulatory referral to General Surgery     Follow-up: Return in about 6 months (around 09/01/2023).  Sanda Linger, MD

## 2023-03-03 NOTE — Patient Instructions (Signed)
Health Maintenance, Male Adopting a healthy lifestyle and getting preventive care are important in promoting health and wellness. Ask your health care provider about: The right schedule for you to have regular tests and exams. Things you can do on your own to prevent diseases and keep yourself healthy. What should I know about diet, weight, and exercise? Eat a healthy diet  Eat a diet that includes plenty of vegetables, fruits, low-fat dairy products, and lean protein. Do not eat a lot of foods that are high in solid fats, added sugars, or sodium. Maintain a healthy weight Body mass index (BMI) is a measurement that can be used to identify possible weight problems. It estimates body fat based on height and weight. Your health care provider can help determine your BMI and help you achieve or maintain a healthy weight. Get regular exercise Get regular exercise. This is one of the most important things you can do for your health. Most adults should: Exercise for at least 150 minutes each week. The exercise should increase your heart rate and make you sweat (moderate-intensity exercise). Do strengthening exercises at least twice a week. This is in addition to the moderate-intensity exercise. Spend less time sitting. Even light physical activity can be beneficial. Watch cholesterol and blood lipids Have your blood tested for lipids and cholesterol at 61 years of age, then have this test every 5 years. You may need to have your cholesterol levels checked more often if: Your lipid or cholesterol levels are high. You are older than 61 years of age. You are at high risk for heart disease. What should I know about cancer screening? Many types of cancers can be detected early and may often be prevented. Depending on your health history and family history, you may need to have cancer screening at various ages. This may include screening for: Colorectal cancer. Prostate cancer. Skin cancer. Lung  cancer. What should I know about heart disease, diabetes, and high blood pressure? Blood pressure and heart disease High blood pressure causes heart disease and increases the risk of stroke. This is more likely to develop in people who have high blood pressure readings or are overweight. Talk with your health care provider about your target blood pressure readings. Have your blood pressure checked: Every 3-5 years if you are 18-39 years of age. Every year if you are 40 years old or older. If you are between the ages of 65 and 75 and are a current or former smoker, ask your health care provider if you should have a one-time screening for abdominal aortic aneurysm (AAA). Diabetes Have regular diabetes screenings. This checks your fasting blood sugar level. Have the screening done: Once every three years after age 45 if you are at a normal weight and have a low risk for diabetes. More often and at a younger age if you are overweight or have a high risk for diabetes. What should I know about preventing infection? Hepatitis B If you have a higher risk for hepatitis B, you should be screened for this virus. Talk with your health care provider to find out if you are at risk for hepatitis B infection. Hepatitis C Blood testing is recommended for: Everyone born from 1945 through 1965. Anyone with known risk factors for hepatitis C. Sexually transmitted infections (STIs) You should be screened each year for STIs, including gonorrhea and chlamydia, if: You are sexually active and are younger than 61 years of age. You are older than 61 years of age and your   health care provider tells you that you are at risk for this type of infection. Your sexual activity has changed since you were last screened, and you are at increased risk for chlamydia or gonorrhea. Ask your health care provider if you are at risk. Ask your health care provider about whether you are at high risk for HIV. Your health care provider  may recommend a prescription medicine to help prevent HIV infection. If you choose to take medicine to prevent HIV, you should first get tested for HIV. You should then be tested every 3 months for as long as you are taking the medicine. Follow these instructions at home: Alcohol use Do not drink alcohol if your health care provider tells you not to drink. If you drink alcohol: Limit how much you have to 0-2 drinks a day. Know how much alcohol is in your drink. In the [place] one drink equals one 12 oz bottle of beer (355 mL), one 5 oz glass of wine (148 mL), or one 1 oz glass of hard liquor (44 mL). Lifestyle Do not use any products that contain nicotine or tobacco. These products include cigarettes, chewing tobacco, and vaping devices, such as e-cigarettes. If you need help quitting, ask your health care provider. Do not use street drugs. Do not share needles. Ask your health care provider for help if you need support or information about quitting drugs. General instructions Schedule regular health, dental, and eye exams. Stay current with your vaccines. Tell your health care provider if: You often feel depressed. You have ever been abused or do not feel safe at home. Summary Adopting a healthy lifestyle and getting preventive care are important in promoting health and wellness. Follow your health care provider's instructions about healthy diet, exercising, and getting tested or screened for diseases. Follow your health care provider's instructions on monitoring your cholesterol and blood pressure. This information is not intended to replace advice given to you by your health care provider. Make sure you discuss any questions you have with your health care provider. Document Revised: 09/17/2020 Document Reviewed: 09/17/2020 Elsevier Patient Education  2024 [employer]vier Inc.  

## 2023-03-19 DIAGNOSIS — L700 Acne vulgaris: Secondary | ICD-10-CM | POA: Diagnosis not present

## 2023-03-19 DIAGNOSIS — L820 Inflamed seborrheic keratosis: Secondary | ICD-10-CM | POA: Diagnosis not present

## 2023-03-19 DIAGNOSIS — L821 Other seborrheic keratosis: Secondary | ICD-10-CM | POA: Diagnosis not present

## 2023-03-19 DIAGNOSIS — L815 Leukoderma, not elsewhere classified: Secondary | ICD-10-CM | POA: Diagnosis not present

## 2023-03-19 DIAGNOSIS — D2239 Melanocytic nevi of other parts of face: Secondary | ICD-10-CM | POA: Diagnosis not present

## 2023-03-20 ENCOUNTER — Ambulatory Visit: Payer: Self-pay | Admitting: Surgery

## 2023-03-20 DIAGNOSIS — K4020 Bilateral inguinal hernia, without obstruction or gangrene, not specified as recurrent: Secondary | ICD-10-CM | POA: Diagnosis not present

## 2023-03-20 DIAGNOSIS — N401 Enlarged prostate with lower urinary tract symptoms: Secondary | ICD-10-CM | POA: Diagnosis not present

## 2023-03-20 NOTE — H&P (Signed)
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  He had his annual physical last month and reported a transient pain in the groin area a month prior.  He was noted to have a hernia in the left inguinal field.  Today he states he has been having bilateral groin pain and it is starting to interfere with his quality of life.  He is a Dietitian, works with computers and does not have to do heavy lifting or strenuous work, for exercise he typically does pull up/sit ups/push-ups but no significant heavy lifting.  No previous abdominal surgery but he has had hemorrhoid surgery and kidney stone removal in the past.  He did have lab work done last month which is unremarkable  Review of Systems: A complete review of systems was obtained from the patient.  I have reviewed this information and discussed as appropriate with the patient.  See HPI as well for other ROS.   Medical History: Past Medical History:  Diagnosis Date   Anxiety    Asthma, unspecified asthma severity, unspecified whether complicated, unspecified whether persistent ([facility]-HCC)    GERD (gastroesophageal reflux disease)     There is no problem list on file for this patient.   Past Surgical History:  Procedure Laterality Date   kidney stones       No Known Allergies  Current Outpatient Medications on File Prior to Visit  Medication Sig Dispense Refill   azelastine (ASTELIN) 137 mcg nasal spray 1 TO 2 SPRAYS IN EACH NOSTRIL TWICE DAILY     BREO ELLIPTA 200-25 mcg/dose DsDv Inhale 1 Puff into the lungs once daily     mirtazapine (REMERON) 15 MG tablet Take 1 tablet by mouth at bedtime     montelukast  (SINGULAIR) 10 mg tablet Take 10 mg by mouth once daily     RYALTRIS 665-25 mcg/spray Spry 2 sprays per nostril twice daily     sildenafil (REVATIO) 20 mg tablet TAKE 2 TO 5 TABLETS BY MOUTH EVERY 3 DAYS AS NEEDED FOR SEXUAL ACTIVITY     tadalafiL (CIALIS) 5 MG tablet Take 5 mg by mouth once daily     valACYclovir (VALTREX) 1000 MG tablet Take 1,000 mg by mouth once daily     No current facility-administered medications on file prior to visit.    Family History  Problem Relation Age of Onset   Skin cancer Mother    Colon cancer Father    Stroke Father      Social History   Tobacco Use  Smoking Status Former   Types: Cigarettes  Smokeless Tobacco Never     Social History   Socioeconomic History   Marital status: Single  Tobacco Use   Smoking status: Former    Types: Cigarettes   Smokeless tobacco: Never  Substance and Sexual Activity   Alcohol use: Not Currently   Drug use: Never    Objective:    Vitals:   03/20/23 1532  BP: 112/73  Pulse: 81  Temp: 36.7 C (98 F)  SpO2: 96%  Weight: 56.7 kg (125 lb)  Height: 170.2 cm (5\' 7" )    Body mass index is 19.58 kg/m.  Gen:  A&Ox3, no distress  Unlabored respiration Abdomen soft nontender; he has bilateral small inguinal hernias with Valsalva.  No overlying skin changes.  Assessment and Plan:  Diagnoses and all orders for this visit:  Non-recurrent bilateral inguinal hernia without obstruction or gangrene    We discussed the relevant anatomy and we discussed options for repair.  I recommend the minimally invasive/robotic approach and went over the technique of the procedure.  Discussed risks of bleeding, infection, pain, scarring, injury to structures in the area including nerves, blood vessels, bowel, bladder, risk of chronic pain, hernia recurrence, risk of seroma or hematoma, urinary retention, and risks of general anesthesia including cardiovascular, pulmonary, and thromboembolic complications.  We discussed  typical postop recovery, timeline, and activity limitations.  We also discussed the option of ongoing observation, with high rate of ultimately returning for surgery and risk of increasing size/symptoms from the hernia as well as incarceration/strangulation and went over symptoms that should prompt him to seek emergency treatment.  Questions were welcomed and answered to the patient's satisfaction. Patient wishes to proceed with scheduling.   Rosette Bellavance Carlye Grippe, MD

## 2023-04-05 ENCOUNTER — Other Ambulatory Visit: Payer: Self-pay | Admitting: Internal Medicine

## 2023-04-05 DIAGNOSIS — F411 Generalized anxiety disorder: Secondary | ICD-10-CM

## 2023-04-07 ENCOUNTER — Ambulatory Visit: Payer: BC Managed Care – PPO | Admitting: Internal Medicine

## 2023-04-14 ENCOUNTER — Ambulatory Visit (INDEPENDENT_AMBULATORY_CARE_PROVIDER_SITE_OTHER): Payer: BC Managed Care – PPO | Admitting: Internal Medicine

## 2023-04-14 ENCOUNTER — Encounter: Payer: Self-pay | Admitting: Internal Medicine

## 2023-04-14 VITALS — BP 90/70 | HR 74 | Temp 97.2°F | Resp 16

## 2023-04-14 DIAGNOSIS — R202 Paresthesia of skin: Secondary | ICD-10-CM

## 2023-04-14 DIAGNOSIS — J3089 Other allergic rhinitis: Secondary | ICD-10-CM

## 2023-04-14 DIAGNOSIS — J4540 Moderate persistent asthma, uncomplicated: Secondary | ICD-10-CM

## 2023-04-14 DIAGNOSIS — J302 Other seasonal allergic rhinitis: Secondary | ICD-10-CM

## 2023-04-14 DIAGNOSIS — H1045 Other chronic allergic conjunctivitis: Secondary | ICD-10-CM

## 2023-04-14 DIAGNOSIS — R200 Anesthesia of skin: Secondary | ICD-10-CM

## 2023-04-14 DIAGNOSIS — T781XXD Other adverse food reactions, not elsewhere classified, subsequent encounter: Secondary | ICD-10-CM | POA: Diagnosis not present

## 2023-04-14 MED ORDER — RYALTRIS 665-25 MCG/ACT NA SUSP: NASAL | 5 refills | Status: AC

## 2023-04-14 NOTE — Progress Notes (Signed)
Follow Up Note  RE: Wayne Silva MRN: [id number] DOB: 1961-02-20 Date of Office Visit: 04/14/2023  Referring provider: [name], MD Primary care provider: [name], MD  Chief Complaint: Asthma (Is doing well)  History of Present Illness: I had the pleasure of seeing Wayne Silva for a follow up visit at the [facility] on 04/14/2023. He is a 62 y.o. male, who is being followed for persistent asthma, allergic rhiniits, food intolerance. His previous allergy office visit was on  07/07/22  with Dr. Marlynn Perking. Today is a regular follow up visit.  History obtained from patient, chart review  Today he reports . The patient, with a known history of asthma, reports a generally stable condition. They are currently on Breo , which they admit to missing a couple of doses per week. Despite this, they do not report any significant increase in symptoms with missed doses. The patient uses albuterol approximately once a week. They express willingness to step down from [facility] to a lower dose medication.  The patient also reports a persistent numbness in the nasal area, particularly noticeable in the mornings during their commute to work. This numbness, seems to improve as the day progresses. The patient speculates that this could be due to distraction from daily activities. They also note that the numbness seems to affect their breathing, making it feel more difficult, particularly in the mornings. However, this too improves as the day progresses.  The patient has a history of nasal surgery, which they believe may have resulted in some peripheral nerve damage, causing the persistent numbness. They express some regret about the surgery, as they feel it has not significantly improved their breathing as they had hoped. They have been trying to manage the numbness and its effects on their own, including practicing breathing exercises and using an over-the-counter allergy medication  for decongestion. They report that these measures seem to provide some relief.  Despite above, his allergic rhinitis remains well controlled without breakthrough sneezing, rhinorrhea on ryaltris and montelukast.   He continues to avoid garlic, corn due to persistent food intolerance symptoms.  Still avoiding peanuts.    Pertinent History/Diagnostics:  - Asthma: persistent asthma, exercise main trigger, -0 OCS, ED, UC visits in past year, 0 hospitalizations/intubations              - normal spirometry (03/11/22): ratio 83, 3.47 FEV1 (pre),  - Allergic Rhinitis:              - SPT environmental panel (06/21/2018): positive to grass pollen, weed pollen, tree pollen, mold              -septorhinoplasty and turbinate reduction on August 19, 2021.  - Food Allergy (peanut, garlic, corn )             - Hx of reaction: feeling of throat irritation, chest tightness and upset  stomach with peanut, garlic and corn              - SPT select foods (04/09/22): negative to full food panel   - Low FODMAP diet recommended     Assessment and Plan: Wayne Silva is a 62 y.o. male with: Numbness and tingling  Moderate persistent asthma without complication - Plan: Spirometry with Graph  Seasonal and perennial allergic rhinitis  Adverse food reaction, subsequent encounter  Other chronic allergic conjunctivitis of both eyes   Plan: Patient Instructions  Persistent asthma:  well controlled -Breathing tests today showed looked great!  -  We will step down therapy   PLAN:  - Spacer not needed with current regimen. - Daily controller medication(s): Singulair 10mg  daily and Breo 100/105mcg one puff once daily - Prior to physical activity: albuterol 2 puffs 10-15 minutes before physical activity. - Rescue medications: albuterol 4 puffs every 4-6 hours as needed  - Asthma control goals:  * Full participation in all desired activities (may need albuterol before activity) * Albuterol use two time or less a week  on average (not counting use with activity) * Cough interfering with sleep two time or less a month * Oral steroids no more than once a year * No hospitalizations  Seasonal and Perennial  Rhinitis: with persistent symptoms - Given numbness, suspect nerve damage which may be persistent   -recommend avoiding touching nose as much as possible, to minimize symptoms  - allergen avoidance to grass pollen, weed pollen, tree pollen, mold - consider allergy shots as long term control of your symptoms by teaching your immune system to be more tolerant of your allergy triggers - Continue Ryaltris 2 sprays per nostril twice daily (this will have the turbinate swelling and allergic symptoms)   -samples given today  - Continue Singulair (Montelukast) 10mg  nightly. - Consider over the counter antihistamine daily or daily as needed.   -Your options include Zyrtec (Cetirizine) 10mg , Claritin (Loratadine) 10mg , Allegra (Fexofenadine) 180mg , or Xyzal (Levocetirinze) 5mg    Food Intolerance  - Continue low FODMAP diet  - avoid trigger foods as needed  - No indication for epipen   Follow up:  6 months   Thank you so much for letting me partake in your care today.  Don't hesitate to reach out if you have any additional concerns!  Ferol Luz, MD  [facility] [place] [place]    Follow up:  3 months   Thank you so much for letting me partake in your care today.  Don't hesitate to reach out if you have any additional concerns!  Ferol Luz, MD  [facility] [place], [place]   Meds ordered this encounter  Medications   Olopatadine-Mometasone (RYALTRIS) 665-25 MCG/ACT SUSP    Sig: 2 sprays per nostril twice daily    Dispense:  29 g    Refill:  5    Lab Orders  No laboratory test(s) ordered today   Diagnostics: Spirometry:  Tracings reviewed. His effort: Good reproducible efforts. FVC: 3.80 L FEV1: 3.20 L, 100% predicted FEV1/FVC ratio:  82% Interpretation: Spirometry consistent with normal pattern.  Please see scanned spirometry results for details.   Results interpreted by myself during this encounter and discussed with patient/family.   Medication List:  Current Outpatient Medications  Medication Sig Dispense Refill   Albuterol-Budesonide (AIRSUPRA) 90-80 MCG/ACT AERO Inhale 2 puffs into the lungs 4 (four) times daily as needed. 32.1 g 1   azelastine (ASTELIN) 0.1 % nasal spray 1 TO 2 SPRAYS IN EACH NOSTRIL TWICE DAILY     BREO ELLIPTA 200-25 MCG/ACT AEPB INHALE 1 PUFF BY MOUTH EVERY DAY 180 each 0   cetirizine-pseudoephedrine (ZYRTEC-D ALLERGY & CONGESTION) 5-120 MG tablet Take 1 tablet by mouth 2 (two) times daily. 180 tablet 0   metroNIDAZOLE (METROCREAM) 0.75 % cream Apply 1 Application topically 2 (two) times daily.     mirtazapine (REMERON) 15 MG tablet TAKE 1 TABLET BY MOUTH EVERYDAY AT BEDTIME 90 tablet 0   montelukast (SINGULAIR) 10 MG tablet Take 1 tablet by mouth daily.     Tadalafil (CIALIS  PO) Take by mouth.     Olopatadine-Mometasone (RYALTRIS) [id number] MCG/ACT SUSP 2 sprays per nostril twice daily 29 g 5   No current facility-administered medications for this visit.   Allergies: No Known Allergies I reviewed his past medical history, social history, family history, and environmental history and no significant changes have been reported from his previous visit.  ROS: All others negative except as noted per HPI.   Objective: BP 90/70 (BP Location: Right Arm, Patient Position: Sitting, Cuff Size: Normal)   Pulse 74   Temp (!) 97.2 F (36.2 C) (Temporal)   Resp 16   SpO2 100%  There is no height or weight on file to calculate BMI. General Appearance:  Alert, cooperative, no distress, appears stated age  Head:  Normocephalic, without obvious abnormality, atraumatic  Eyes:  Conjunctiva clear, EOM's intact  Nose: Nares normal, normal mucosa, no visible anterior polyps, and septum midline  Throat:  Lips, tongue normal; teeth and gums normal,   Neck: Supple, symmetrical  Lungs:   clear to auscultation bilaterally, Respirations unlabored, no coughing  Heart:  regular rate and rhythm and no murmur, Appears well perfused  Extremities: No edema  Skin: Skin color, texture, turgor normal, no rashes or lesions on visualized portions of skin  Neurologic: No gross deficits   Previous notes and tests were reviewed. The plan was reviewed with the patient/family, and all questions/concerned were addressed.  It was my pleasure to see Wayne Silva today and participate in his care. Please feel free to contact me with any questions or concerns.  Sincerely,  Ferol Luz, MD  Allergy & Immunology  [facility] Office: [phone]

## 2023-04-14 NOTE — Patient Instructions (Addendum)
Persistent asthma:  well controlled -Breathing tests today showed looked great!  - We will step down therapy   PLAN:  - Spacer not needed with current regimen. - Daily controller medication(s): Singulair 10mg  daily and Breo 100/7mcg one puff once daily - Prior to physical activity: albuterol 2 puffs 10-15 minutes before physical activity. - Rescue medications: albuterol 4 puffs every 4-6 hours as needed  - Asthma control goals:  * Full participation in all desired activities (may need albuterol before activity) * Albuterol use two time or less a week on average (not counting use with activity) * Cough interfering with sleep two time or less a month * Oral steroids no more than once a year * No hospitalizations  Seasonal and Perennial  Rhinitis: with persistent symptoms - Given numbness, suspect nerve damage which may be persistent   -recommend avoiding touching nose as much as possible, to minimize symptoms  - allergen avoidance to grass pollen, weed pollen, tree pollen, mold - consider allergy shots as long term control of your symptoms by teaching your immune system to be more tolerant of your allergy triggers - Continue Ryaltris 2 sprays per nostril twice daily (this will have the turbinate swelling and allergic symptoms)   -samples given today  - Continue Singulair (Montelukast) 10mg  nightly. - Consider over the counter antihistamine daily or daily as needed.   -Your options include Zyrtec (Cetirizine) 10mg , Claritin (Loratadine) 10mg , Allegra (Fexofenadine) 180mg , or Xyzal (Levocetirinze) 5mg    Food Intolerance  - Continue low FODMAP diet  - avoid trigger foods as needed  - No indication for epipen   Follow up:  6 months   Thank you so much for letting me partake in your care today.  Don't hesitate to reach out if you have any additional concerns!  Ferol Luz, MD  [facility] [place], [place]    Follow up:  3 months   Thank you so much for letting  me partake in your care today.  Don't hesitate to reach out if you have any additional concerns!  Ferol Luz, MD  [facility] [place], [place]

## 2023-04-16 ENCOUNTER — Telehealth: Payer: Self-pay | Admitting: Internal Medicine

## 2023-04-16 NOTE — Telephone Encounter (Signed)
Patient called and stated the medication  Ryaltris Olopatadine-Mometasone (RYALTRIS) [id number] MCG/ACT SUSP is not being covered by insurance and wanted to know if he can be prescribed something else and requested a call back.

## 2023-04-17 MED ORDER — AZELASTINE-FLUTICASONE 137-50 MCG/ACT NA SUSP: 2.0000 | Freq: Two times a day (BID) | NASAL | 5 refills | Status: AC

## 2023-04-17 NOTE — Telephone Encounter (Signed)
We can try dymista 1-2 sprays per nostril twice daily

## 2023-04-17 NOTE — Telephone Encounter (Signed)
Sent in dymista for pt to [facility]

## 2023-04-17 NOTE — Telephone Encounter (Signed)
Can we change to another nasal spray

## 2023-04-17 NOTE — Addendum Note (Signed)
Addended by: Berna Bue on: 04/17/2023 04:25 PM   Modules accepted: Orders

## 2023-05-09 ENCOUNTER — Encounter: Payer: Self-pay | Admitting: Internal Medicine

## 2023-05-11 ENCOUNTER — Other Ambulatory Visit: Payer: Self-pay

## 2023-05-11 MED ORDER — MONTELUKAST SODIUM 10 MG PO TABS: 10.0000 mg | ORAL_TABLET | Freq: Every day | ORAL | 1 refills | Status: DC

## 2023-05-28 NOTE — Patient Instructions (Addendum)
SURGICAL WAITING ROOM VISITATION  Patients having surgery or a procedure may have no more than 2 support people in the waiting area - these visitors may rotate.    Children under the age of 28 must have an adult with them who is not the patient.  Due to an increase in RSV and influenza rates and associated hospitalizations, children ages 38 and under may not visit patients in [facility] hospitals.  Visitors with respiratory illnesses are discouraged from visiting and should remain at home.  If the patient needs to stay at the hospital during part of their recovery, the visitor guidelines for inpatient rooms apply. Pre-op nurse will coordinate an appropriate time for 1 support person to accompany patient in pre-op.  This support person may not rotate.    Please refer to the [facility] website for the visitor guidelines for Inpatients (after your surgery is over and you are in a regular room).       Your procedure is scheduled on: 06/04/23   Report to [facility] Main Entrance    Report to admitting at  5:15 AM   Call this number if you have problems the morning of surgery [phone]   Do not eat food or drink liquids :After Midnight.                   Oral Hygiene is also important to reduce your risk of infection.                                    Remember - BRUSH YOUR TEETH THE MORNING OF SURGERY WITH YOUR REGULAR TOOTHPASTE   Stop all vitamins and herbal supplements 7 days before surgery.   Take these medicines the morning of surgery with A SIP OF WATER: Zyrtec-D, Montelukast(Singulair)             You may not have any metal on your body including hair pins, jewelry, and body piercing             Do not wear make-up, lotions, powders, perfumes/cologne, or deodorant              Men may shave face and neck.   Do not bring valuables to the hospital. [facility] IS NOT             RESPONSIBLE   FOR VALUABLES.   Contacts, glasses, dentures or bridgework may not  be worn into surgery.  DO NOT BRING YOUR HOME MEDICATIONS TO THE HOSPITAL. PHARMACY WILL DISPENSE MEDICATIONS LISTED ON YOUR MEDICATION LIST TO YOU DURING YOUR ADMISSION IN THE HOSPITAL!    Patients discharged on the day of surgery will not be allowed to drive home.  Someone NEEDS to stay with you for the first 24 hours after anesthesia.   Special Instructions: Bring a copy of your healthcare power of attorney and living will documents the day of surgery if you haven't scanned them before.              Please read over the following fact sheets you were given: IF YOU HAVE QUESTIONS ABOUT YOUR PRE-OP INSTRUCTIONS PLEASE CALL [phone] Wayne Silva   If you received a COVID test during your pre-op visit  it is requested that you wear a mask when out in public, stay away from anyone that may not be feeling well and notify your surgeon if you develop symptoms. If you test positive  for Covid or have been in contact with anyone that has tested positive in the last 10 days please notify you surgeon.    [facility] - Preparing for Surgery Before surgery, you can play an important role.  Because skin is not sterile, your skin needs to be as free of germs as possible.  You can reduce the number of germs on your skin by washing with CHG (chlorahexidine gluconate) soap before surgery.  CHG is an antiseptic cleaner which kills germs and bonds with the skin to continue killing germs even after washing. Please DO NOT use if you have an allergy to CHG or antibacterial soaps.  If your skin becomes reddened/irritated stop using the CHG and inform your nurse when you arrive at Short Stay. Do not shave (including legs and underarms) for at least 48 hours prior to the first CHG shower.  You may shave your face/neck.  Please follow these instructions carefully:  1.  Shower with CHG Soap the night before surgery and the  morning of surgery.  2.  If you choose to wash your hair, wash your hair first as usual with your  normal  shampoo.  3.  After you shampoo, rinse your hair and body thoroughly to remove the shampoo.                             4.  Use CHG as you would any other liquid soap.  You can apply chg directly to the skin and wash.  Gently with a scrungie or clean washcloth.  5.  Apply the CHG Soap to your body ONLY FROM THE NECK DOWN.   Do   not use on face/ open                           Wound or open sores. Avoid contact with eyes, ears mouth and   genitals (private parts).                       Wash face,  Genitals (private parts) with your normal soap.             6.  Wash thoroughly, paying special attention to the area where your    surgery  will be performed.  7.  Thoroughly rinse your body with warm water from the neck down.  8.  DO NOT shower/wash with your normal soap after using and rinsing off the CHG Soap.                9.  Pat yourself dry with a clean towel.            10.  Wear clean pajamas.            11.  Place clean sheets on your bed the night of your first shower and do not  sleep with pets. Day of Surgery : Do not apply any lotions/deodorants the morning of surgery.  Please wear clean clothes to the hospital/surgery center.  FAILURE TO FOLLOW THESE INSTRUCTIONS MAY RESULT IN THE CANCELLATION OF YOUR SURGERY  PATIENT SIGNATURE_________________________________  NURSE SIGNATURE__________________________________  ________________________________________________________________________

## 2023-05-28 NOTE — Progress Notes (Addendum)
COVID Vaccine received:  []  No [x]  Yes Date of any COVID positive Test in last 90 days: no PCP - Sanda Linger MD Cardiologist - n/a  Chest x-ray -  EKG -  03/03/23 Epic Stress Test -  ECHO -  Cardiac Cath -   Bowel Prep - [x]  No  []   Yes ______  Pacemaker / ICD device [x]  No []  Yes   Spinal Cord Stimulator:[x]  No []  Yes       History of Sleep Apnea? [x]  No []  Yes   CPAP used?- [x]  No []  Yes    Does the patient monitor blood sugar?          [x]  No []  Yes  []  N/A  Patient has: [x]  NO Hx DM   []  Pre-DM                 []  DM1  []   DM2 Does patient have a [employer] or Dexacom? []  No []  Yes   Fasting Blood Sugar Ranges-  Checks Blood Sugar _____ times a day  GLP1 agonist / usual dose - no GLP1 instructions:  SGLT-2 inhibitors / usual dose - no SGLT-2 instructions:   Blood Thinner / Instructions:no Aspirin Instructions:no  Comments:   Activity level: Patient is able to climb a flight of stairs without difficulty; [x]  No CP  [x]  No SOB,___   Patient can perform ADLs without assistance.   Anesthesia review:   Patient denies shortness of breath, fever, cough and chest pain at PAT appointment.  Patient verbalized understanding and agreement to the Pre-Surgical Instructions that were given to them at this PAT appointment. Patient was also educated of the need to review these PAT instructions again prior to his/her surgery.I reviewed the appropriate phone numbers to call if they have any and questions or concerns.

## 2023-05-29 ENCOUNTER — Other Ambulatory Visit: Payer: Self-pay

## 2023-05-29 ENCOUNTER — Encounter (HOSPITAL_COMMUNITY)
Admission: RE | Admit: 2023-05-29 | Discharge: 2023-05-29 | Disposition: A | Discharge status: Home / Self Care | Payer: BC Managed Care – PPO | Source: Ambulatory Visit | Attending: Surgery | Admitting: Surgery

## 2023-05-29 ENCOUNTER — Encounter (HOSPITAL_COMMUNITY): Payer: Self-pay

## 2023-05-29 VITALS — BP 94/74 | HR 70 | Temp 98.0°F | Resp 16 | Ht 67.0 in | Wt 126.0 lb

## 2023-05-29 DIAGNOSIS — Z01818 Encounter for other preprocedural examination: Secondary | ICD-10-CM

## 2023-05-29 DIAGNOSIS — Z01812 Encounter for preprocedural laboratory examination: Secondary | ICD-10-CM | POA: Insufficient documentation

## 2023-05-29 HISTORY — DX: Personal history of urinary calculi: Z87.442

## 2023-05-29 LAB — CBC
HCT: 44.5 % (ref 39.0–52.0)
Hemoglobin: 14.8 g/dL (ref 13.0–17.0)
MCH: 31.2 pg (ref 26.0–34.0)
MCHC: 33.3 g/dL (ref 30.0–36.0)
MCV: 93.7 fL (ref 80.0–100.0)
Platelets: 245 10*3/uL (ref 150–400)
RBC: 4.75 MIL/uL (ref 4.22–5.81)
RDW: 12.3 % (ref 11.5–15.5)
WBC: 6.4 10*3/uL (ref 4.0–10.5)
nRBC: 0 % (ref 0.0–0.2)

## 2023-06-01 HISTORY — PX: HERNIA REPAIR: SHX51

## 2023-06-03 NOTE — Anesthesia Preprocedure Evaluation (Signed)
Anesthesia Evaluation  Patient identified by MRN, date of birth, ID band Patient awake    Reviewed: Allergy & Precautions, Patient's Chart, lab work & pertinent test results  Airway        Dental   Pulmonary asthma , former smoker          Cardiovascular negative cardio ROS      Neuro/Psych  PSYCHIATRIC DISORDERS Anxiety Depression       GI/Hepatic Neg liver ROS,GERD  ,,  Endo/Other    Renal/GU      Musculoskeletal   Abdominal   Peds  Hematology Lab Results      Component                Value               Date                      WBC                      6.4                 05/29/2023                HGB                      14.8                05/29/2023                HCT                      44.5                05/29/2023                MCV                      93.7                05/29/2023                PLT                      245                 05/29/2023              Anesthesia Other Findings   Reproductive/Obstetrics                              Anesthesia Physical Anesthesia Plan  ASA: 2  Anesthesia Plan: General   Post-op Pain Management: Precedex, Toradol IV (intra-op)* and Tylenol PO (pre-op)*   Induction: Intravenous  PONV Risk Score and Plan: 3 and Midazolam, Treatment may vary due to age or medical condition and Ondansetron  Airway Management Planned: Oral ETT  Additional Equipment: None  Intra-op Plan:   Post-operative Plan: Extubation in OR  Informed Consent:      Dental advisory given  Plan Discussed with:   Anesthesia Plan Comments:          Anesthesia Quick Evaluation

## 2023-06-04 ENCOUNTER — Ambulatory Visit (HOSPITAL_COMMUNITY): Payer: Self-pay | Admitting: Anesthesiology

## 2023-06-04 ENCOUNTER — Other Ambulatory Visit: Payer: Self-pay

## 2023-06-04 ENCOUNTER — Ambulatory Visit (HOSPITAL_COMMUNITY)
Admission: RE | Admit: 2023-06-04 | Discharge: 2023-06-04 | Disposition: A | Discharge status: Home / Self Care | Payer: BC Managed Care – PPO | Attending: Surgery | Admitting: Surgery

## 2023-06-04 ENCOUNTER — Encounter (HOSPITAL_COMMUNITY): Payer: Self-pay | Admitting: Surgery

## 2023-06-04 ENCOUNTER — Encounter (HOSPITAL_COMMUNITY)
Admission: RE | Disposition: A | Discharge status: Home / Self Care | Payer: Self-pay | Source: Home / Self Care | Attending: Surgery

## 2023-06-04 DIAGNOSIS — F32A Depression, unspecified: Secondary | ICD-10-CM | POA: Diagnosis not present

## 2023-06-04 DIAGNOSIS — Z79899 Other long term (current) drug therapy: Secondary | ICD-10-CM | POA: Diagnosis not present

## 2023-06-04 DIAGNOSIS — F419 Anxiety disorder, unspecified: Secondary | ICD-10-CM | POA: Diagnosis not present

## 2023-06-04 DIAGNOSIS — Z79624 Long term (current) use of inhibitors of nucleotide synthesis: Secondary | ICD-10-CM | POA: Diagnosis not present

## 2023-06-04 DIAGNOSIS — K4020 Bilateral inguinal hernia, without obstruction or gangrene, not specified as recurrent: Secondary | ICD-10-CM | POA: Insufficient documentation

## 2023-06-04 DIAGNOSIS — J45909 Unspecified asthma, uncomplicated: Secondary | ICD-10-CM | POA: Insufficient documentation

## 2023-06-04 DIAGNOSIS — K219 Gastro-esophageal reflux disease without esophagitis: Secondary | ICD-10-CM | POA: Diagnosis not present

## 2023-06-04 DIAGNOSIS — N3010 Interstitial cystitis (chronic) without hematuria: Secondary | ICD-10-CM | POA: Diagnosis not present

## 2023-06-04 DIAGNOSIS — Z01818 Encounter for other preprocedural examination: Secondary | ICD-10-CM

## 2023-06-04 DIAGNOSIS — Z87891 Personal history of nicotine dependence: Secondary | ICD-10-CM | POA: Insufficient documentation

## 2023-06-04 DIAGNOSIS — Z7951 Long term (current) use of inhaled steroids: Secondary | ICD-10-CM | POA: Diagnosis not present

## 2023-06-04 LAB — CBC
HCT: 41.8 % (ref 39.0–52.0)
Hemoglobin: 14.2 g/dL (ref 13.0–17.0)
MCH: 31.3 pg (ref 26.0–34.0)
MCHC: 34 g/dL (ref 30.0–36.0)
MCV: 92.3 fL (ref 80.0–100.0)
Platelets: 185 10*3/uL (ref 150–400)
RBC: 4.53 MIL/uL (ref 4.22–5.81)
RDW: 12.5 % (ref 11.5–15.5)
WBC: 6.3 10*3/uL (ref 4.0–10.5)
nRBC: 0 % (ref 0.0–0.2)

## 2023-06-04 SURGERY — REPAIR, HERNIA, INGUINAL, BILATERAL, ROBOT-ASSISTED
Anesthesia: General | Laterality: Bilateral

## 2023-06-04 MED ORDER — ACETAMINOPHEN 500 MG PO TABS
1000.0000 mg | ORAL_TABLET | ORAL | Status: AC
  Administered 2023-06-04: 1000 mg via ORAL
  Filled 2023-06-04: qty 2

## 2023-06-04 MED ORDER — HYDROMORPHONE HCL 1 MG/ML IJ SOLN
INTRAMUSCULAR | Status: AC
  Filled 2023-06-04: qty 1

## 2023-06-04 MED ORDER — 0.9 % SODIUM CHLORIDE (POUR BTL) OPTIME
TOPICAL | Status: DC | PRN
  Administered 2023-06-04: 1000 mL

## 2023-06-04 MED ORDER — CHLORHEXIDINE GLUCONATE 0.12 % MT SOLN
15.0000 mL | Freq: Once | OROMUCOSAL | Status: AC
  Administered 2023-06-04: 15 mL via OROMUCOSAL

## 2023-06-04 MED ORDER — BUPIVACAINE LIPOSOME 1.3 % IJ SUSP: 20.0000 mL | Freq: Once | INTRAMUSCULAR | Status: DC

## 2023-06-04 MED ORDER — DEXAMETHASONE SODIUM PHOSPHATE 10 MG/ML IJ SOLN
INTRAMUSCULAR | Status: DC | PRN
  Administered 2023-06-04: 10 mg via INTRAVENOUS

## 2023-06-04 MED ORDER — GABAPENTIN 300 MG PO CAPS
300.0000 mg | ORAL_CAPSULE | ORAL | Status: AC
  Administered 2023-06-04: 300 mg via ORAL
  Filled 2023-06-04: qty 1

## 2023-06-04 MED ORDER — DEXAMETHASONE SODIUM PHOSPHATE 10 MG/ML IJ SOLN
INTRAMUSCULAR | Status: AC
Start: 2023-06-04 — End: ?
  Filled 2023-06-04: qty 1

## 2023-06-04 MED ORDER — ONDANSETRON HCL 4 MG/2ML IJ SOLN
INTRAMUSCULAR | Status: DC | PRN
  Administered 2023-06-04: 4 mg via INTRAVENOUS

## 2023-06-04 MED ORDER — FENTANYL CITRATE (PF) 100 MCG/2ML IJ SOLN
INTRAMUSCULAR | Status: AC
  Filled 2023-06-04: qty 2

## 2023-06-04 MED ORDER — OXYCODONE HCL 5 MG PO TABS
ORAL_TABLET | ORAL | Status: AC
  Filled 2023-06-04: qty 1

## 2023-06-04 MED ORDER — BUPIVACAINE-EPINEPHRINE (PF) 0.25% -1:200000 IJ SOLN
INTRAMUSCULAR | Status: DC | PRN
  Administered 2023-06-04: 50 mL

## 2023-06-04 MED ORDER — SUGAMMADEX SODIUM 200 MG/2ML IV SOLN
INTRAVENOUS | Status: DC | PRN
  Administered 2023-06-04: 200 mg via INTRAVENOUS

## 2023-06-04 MED ORDER — MIDAZOLAM HCL 2 MG/2ML IJ SOLN
INTRAMUSCULAR | Status: AC
  Filled 2023-06-04: qty 2

## 2023-06-04 MED ORDER — ROCURONIUM BROMIDE 10 MG/ML (PF) SYRINGE
PREFILLED_SYRINGE | INTRAVENOUS | Status: AC
Start: 2023-06-04 — End: ?
  Filled 2023-06-04: qty 10

## 2023-06-04 MED ORDER — DOCUSATE SODIUM 100 MG PO CAPS: 100.0000 mg | ORAL_CAPSULE | Freq: Two times a day (BID) | ORAL | 0 refills | Status: AC

## 2023-06-04 MED ORDER — KETOROLAC TROMETHAMINE 30 MG/ML IJ SOLN: 30.0000 mg | Freq: Once | INTRAMUSCULAR | Status: DC | PRN

## 2023-06-04 MED ORDER — PROPOFOL 10 MG/ML IV BOLUS
INTRAVENOUS | Status: DC | PRN
  Administered 2023-06-04: 100 mg via INTRAVENOUS

## 2023-06-04 MED ORDER — ONDANSETRON HCL 4 MG/2ML IJ SOLN
INTRAMUSCULAR | Status: AC
  Filled 2023-06-04: qty 2

## 2023-06-04 MED ORDER — FENTANYL CITRATE (PF) 100 MCG/2ML IJ SOLN
INTRAMUSCULAR | Status: DC | PRN
  Administered 2023-06-04: 25 ug via INTRAVENOUS
  Administered 2023-06-04: 75 ug via INTRAVENOUS

## 2023-06-04 MED ORDER — CHLORHEXIDINE GLUCONATE 4 % EX SOLN: 60.0000 mL | Freq: Once | CUTANEOUS | Status: DC

## 2023-06-04 MED ORDER — OXYCODONE HCL 5 MG PO TABS
5.0000 mg | ORAL_TABLET | Freq: Once | ORAL | Status: AC | PRN
  Administered 2023-06-04: 5 mg via ORAL

## 2023-06-04 MED ORDER — DEXMEDETOMIDINE HCL IN NACL 80 MCG/20ML IV SOLN
INTRAVENOUS | Status: DC | PRN
  Administered 2023-06-04: 8 ug via INTRAVENOUS

## 2023-06-04 MED ORDER — ROCURONIUM BROMIDE 10 MG/ML (PF) SYRINGE
PREFILLED_SYRINGE | INTRAVENOUS | Status: DC | PRN
  Administered 2023-06-04: 20 mg via INTRAVENOUS
  Administered 2023-06-04: 40 mg via INTRAVENOUS
  Administered 2023-06-04: 10 mg via INTRAVENOUS

## 2023-06-04 MED ORDER — EPHEDRINE SULFATE (PRESSORS) 50 MG/ML IJ SOLN
INTRAMUSCULAR | Status: DC | PRN
  Administered 2023-06-04: 5 mg via INTRAVENOUS
  Administered 2023-06-04: 10 mg via INTRAVENOUS
  Administered 2023-06-04 (×2): 5 mg via INTRAVENOUS

## 2023-06-04 MED ORDER — LACTATED RINGERS IV SOLN: INTRAVENOUS | Status: DC

## 2023-06-04 MED ORDER — OXYCODONE HCL 5 MG PO TABS: 5.0000 mg | ORAL_TABLET | Freq: Three times a day (TID) | ORAL | 0 refills | Status: AC | PRN

## 2023-06-04 MED ORDER — ONDANSETRON HCL 4 MG/2ML IJ SOLN
4.0000 mg | Freq: Once | INTRAMUSCULAR | Status: AC | PRN
  Administered 2023-06-04: 4 mg via INTRAVENOUS

## 2023-06-04 MED ORDER — EPHEDRINE 5 MG/ML INJ
INTRAVENOUS | Status: AC
  Filled 2023-06-04: qty 5

## 2023-06-04 MED ORDER — ORAL CARE MOUTH RINSE: 15.0000 mL | Freq: Once | OROMUCOSAL | Status: AC

## 2023-06-04 MED ORDER — DEXMEDETOMIDINE HCL IN NACL 80 MCG/20ML IV SOLN
INTRAVENOUS | Status: AC
  Filled 2023-06-04: qty 20

## 2023-06-04 MED ORDER — PROPOFOL 10 MG/ML IV BOLUS
INTRAVENOUS | Status: AC
Start: 2023-06-04 — End: ?
  Filled 2023-06-04: qty 20

## 2023-06-04 MED ORDER — MIDAZOLAM HCL 2 MG/2ML IJ SOLN
INTRAMUSCULAR | Status: DC | PRN
  Administered 2023-06-04: 2 mg via INTRAVENOUS

## 2023-06-04 MED ORDER — CEFAZOLIN SODIUM-DEXTROSE 2-4 GM/100ML-% IV SOLN
2.0000 g | INTRAVENOUS | Status: AC
  Administered 2023-06-04: 2 g via INTRAVENOUS
  Filled 2023-06-04: qty 100

## 2023-06-04 MED ORDER — BUPIVACAINE LIPOSOME 1.3 % IJ SUSP
INTRAMUSCULAR | Status: AC
Start: 2023-06-04 — End: ?
  Filled 2023-06-04: qty 20

## 2023-06-04 MED ORDER — ONDANSETRON HCL 4 MG/2ML IJ SOLN
INTRAMUSCULAR | Status: AC
Start: 2023-06-04 — End: ?
  Filled 2023-06-04: qty 2

## 2023-06-04 MED ORDER — OXYCODONE HCL 5 MG/5ML PO SOLN: 5.0000 mg | Freq: Once | ORAL | Status: AC | PRN

## 2023-06-04 MED ORDER — LIDOCAINE HCL (CARDIAC) PF 100 MG/5ML IV SOSY
PREFILLED_SYRINGE | INTRAVENOUS | Status: DC | PRN
  Administered 2023-06-04: 100 mg via INTRATRACHEAL

## 2023-06-04 MED ORDER — HYDROMORPHONE HCL 1 MG/ML IJ SOLN
0.2500 mg | INTRAMUSCULAR | Status: DC | PRN
Start: 2023-06-04 — End: 2023-06-04
  Administered 2023-06-04 (×2): 0.5 mg via INTRAVENOUS

## 2023-06-04 MED ORDER — BUPIVACAINE-EPINEPHRINE 0.25% -1:200000 IJ SOLN
INTRAMUSCULAR | Status: AC
  Filled 2023-06-04: qty 1

## 2023-06-04 SURGICAL SUPPLY — 49 items
ANTIFOG SOL W/FOAM PAD STRL (MISCELLANEOUS) ×1 IMPLANT
APPLICATOR COTTON TIP 6 STRL (MISCELLANEOUS) ×2 IMPLANT
APPLICATOR COTTON TIP 6IN STRL (MISCELLANEOUS) ×2 IMPLANT
BAG COUNTER SPONGE SURGICOUNT (BAG) IMPLANT
BLADE SURG SZ11 CARB STEEL (BLADE) ×1 IMPLANT
CHLORAPREP W/TINT 26 (MISCELLANEOUS) ×1 IMPLANT
COVER SURGICAL LIGHT HANDLE (MISCELLANEOUS) ×1 IMPLANT
COVER TIP SHEARS 8 DVNC (MISCELLANEOUS) ×1 IMPLANT
DRAPE ARM DVNC X/XI (DISPOSABLE) ×4 IMPLANT
DRAPE COLUMN DVNC XI (DISPOSABLE) ×1 IMPLANT
DRIVER NDL LRG 8 DVNC XI (INSTRUMENTS) ×1 IMPLANT
DRIVER NDL MEGA SUTCUT DVNCXI (INSTRUMENTS) ×2 IMPLANT
DRIVER NDLE LRG 8 DVNC XI (INSTRUMENTS) IMPLANT
DRIVER NDLE MEGA SUTCUT DVNCXI (INSTRUMENTS) ×1 IMPLANT
ELECT REM PT RETURN 15FT ADLT (MISCELLANEOUS) ×1 IMPLANT
FORCEPS BPLR FENES DVNC XI (FORCEP) IMPLANT
GLOVE BIO SURGEON STRL SZ 6 (GLOVE) ×2 IMPLANT
GLOVE INDICATOR 6.5 STRL GRN (GLOVE) ×2 IMPLANT
GLOVE SS BIOGEL STRL SZ 6 (GLOVE) ×1 IMPLANT
GOWN STRL REUS W/ TWL LRG LVL3 (GOWN DISPOSABLE) ×2 IMPLANT
GOWN STRL REUS W/ TWL XL LVL3 (GOWN DISPOSABLE) IMPLANT
GRASPER TIP-UP FEN DVNC XI (INSTRUMENTS) ×1 IMPLANT
IRRIG SUCT STRYKERFLOW 2 WTIP (MISCELLANEOUS) IMPLANT
IRRIGATION SUCT STRKRFLW 2 WTP (MISCELLANEOUS) IMPLANT
KIT BASIN OR (CUSTOM PROCEDURE TRAY) ×1 IMPLANT
KIT TURNOVER KIT A (KITS) IMPLANT
MESH 3DMAX MID 4X6 LT LRG (Mesh General) IMPLANT
MESH 3DMAX MID 4X6 RT LRG (Mesh General) IMPLANT
MESH 3DMAX MID 5X7 RT XLRG (Mesh General) IMPLANT
NDL HYPO 22X1.5 SAFETY MO (MISCELLANEOUS) ×1 IMPLANT
NDL INSUFFLATION 14GA 120MM (NEEDLE) ×1 IMPLANT
NEEDLE HYPO 22X1.5 SAFETY MO (MISCELLANEOUS) ×1 IMPLANT
NEEDLE INSUFFLATION 14GA 120MM (NEEDLE) ×1 IMPLANT
PACK CARDIOVASCULAR III (CUSTOM PROCEDURE TRAY) ×1 IMPLANT
PAD POSITIONING PINK XL (MISCELLANEOUS) ×1 IMPLANT
SCISSORS MNPLR CVD DVNC XI (INSTRUMENTS) ×1 IMPLANT
SEAL UNIV 5-12 XI (MISCELLANEOUS) ×3 IMPLANT
SOL ELECTROSURG ANTI STICK (MISCELLANEOUS) ×1 IMPLANT
SOLUTION ANTFG W/FOAM PAD STRL (MISCELLANEOUS) ×1 IMPLANT
SOLUTION ELECTROSURG ANTI STCK (MISCELLANEOUS) ×1 IMPLANT
SPIKE FLUID TRANSFER (MISCELLANEOUS) ×1 IMPLANT
SUT MNCRL AB 4-0 PS2 18 (SUTURE) ×1 IMPLANT
SUT VIC AB 3-0 SH 27XBRD (SUTURE) ×1 IMPLANT
SUT VLOC 180 2-0 6IN GS21 (SUTURE) ×1 IMPLANT
SUT VLOC 3-0 9IN GRN (SUTURE) IMPLANT
SYR 10ML LL (SYRINGE) ×1 IMPLANT
SYR 20ML LL LF (SYRINGE) ×1 IMPLANT
TOWEL OR 17X26 10 PK STRL BLUE (TOWEL DISPOSABLE) ×1 IMPLANT
TUBING INSUFFLATION 10FT LAP (TUBING) ×1 IMPLANT

## 2023-06-04 NOTE — Op Note (Signed)
Operative Note  Wayne Silva  [id number]  [id number]  06/04/2023   Surgeon: Phylliss Blakes MD   Procedure performed: Robotic (transabdominal preperitoneal) bilateral inguinal hernia repairs   Preop diagnosis: Bilateral inguinal hernias Post-op diagnosis/intraop findings: Small indirect left inguinal hernia, moderate direct right inguinal hernia   Specimens: no Retained items: no  EBL: minimal cc Complications: none   Description of procedure: After confirming informed consent the patient was taken to the operating room and placed supine on operating room table where general endotracheal anesthesia was initiated, preoperative antibiotics were administered, SCDs applied, foley inserted (removed at the end of the case) and a formal timeout was performed. The abdomen was clipped, prepped and draped in usual sterile fashion.  Peritoneal access was gained with a left subcostal Veress needle and insufflation to 15 mmHg ensued without incident.  A periumbilical 8mm robotic trocar and camera were inserted. The abdomen was inspected and confirmed to be free of any injury from our entry or gross abnormalities.  The patient was then placed in Trendelenburg and the pelvis inspected.  He was confirmed to have bilateral inguinal hernias; consisting of a small indirect left and moderate direct right sided hernia.  Bilateral laparoscopic assisted tap blocks were performed with Exparel mixed with quarter percent Marcaine with epinephrine for postoperative pain control.  Under direct visualization 2 additional 8 mm trocars were placed, the robot was docked and instruments inserted under direct visualization.  Beginning on the patient's left side, a peritoneal flap was developed using sharp and cautery dissection spanning from the anterior superior iliac spine to the medial umbilical ligament.  A combination of sharp, blunt, and cautery dissection were employed to first developed the lateral compartment.  We then  turned to the medial compartment.  The space of Retzius was entered and gently bluntly dissected.  The Cooper's ligament was exposed as well as about 2 cm inferior to this.  We then carefully dissected the indirect hernia sac free until this was completely reduced.  The gonadal vessels and vas deferens were peritonealized.  The iliac vessels and femoral space were visible and confirmed to be no evidence of the femoral hernia.  There was excellent exposure of the myopectineal orifice.  Hemostasis was confirmed in the dissection field.  A Bard 3D max mid weight large left-sided mesh was then placed with excellent overlap of the indirect, direct and femoral spaces.  This was secured with simple interrupted 3-0 Vicryls to the Cooper's ligament as well as superiorly to the abdominal wall on either side of the inferior epigastric vessels.  The peritoneal flap was then brought back up to cover the mesh and the peritoneum was closed with a running imbricating 3-0 V-Loc, ensuring that the mesh remained flush against the abdominal wall and did not fold or buckle while doing this.  At completion there was no exposed mesh present. Hemostasis was confirmed.  We then turned to the patient's right side where in an identical fashion, a peritoneal flap was developed using sharp and cautery dissection spanning from the anterior superior iliac spine to the medial umbilical ligament.  A combination of sharp, blunt, and cautery dissection were employed to first developed the lateral compartment.  We then turned to the medial compartment.  The space of Retzius was entered and gently bluntly dissected.  The Cooper's ligament was exposed as well as about 2 cm inferior to this.  The direct hernia sac was easily bluntly reduced.  The gonadal vessels and vas deferens were peritonealized.  The  iliac vessels and femoral space were visible and confirmed to be no evidence of the femoral hernia.  There was excellent exposure of the myopectineal  orifice.  Hemostasis was confirmed in the dissection field.  A Bard 3D max mid weight large right-sided mesh was then placed with excellent overlap of the indirect, direct and femoral spaces.  This was secured with simple interrupted 3-0 Vicryls to the Cooper's ligament as well as superiorly to the abdominal wall on either side of the inferior epigastric vessels.  The peritoneal flap was then brought back up to cover the mesh and the peritoneum was closed with a running imbricating 3-0 V-Loc, ensuring that the mesh remained flush against the abdominal wall and did not fold or buckle while doing this.  At completion there was no exposed mesh present. The abdomen was once again surveyed and confirmed to be free of injury, hemostasis was excellent.  All suture needles were removed under direct visualization, instruments removed and robot was undocked.  The abdomen was then desufflated and trocars removed.  Additional local was infiltrated along each incision.  The skin incisions were closed with subcuticular 4-0 Monocryl and Dermabond. The patient was then awakened, extubated and taken to PACU in stable condition.  All counts were correct at the completion of the case.

## 2023-06-04 NOTE — Discharge Instructions (Signed)
HERNIA REPAIR: POST OP INSTRUCTIONS   EAT Gradually transition to a high fiber diet with a fiber supplement over the next few days after discharge.  Start with a light/bland diet (see below)  WALK Walk an hour a day (cumulative- not all at once).  Control your pain to do that.    CONTROL PAIN Control pain so that you can walk, sleep, tolerate sneezing/coughing, and go up/down stairs.  HAVE A BOWEL MOVEMENT DAILY Keep your bowels regular to avoid problems.  OK to try a laxative to override constipation.  OK to use an antidiarrheal to slow down diarrhea.  Call if not better after 2 tries  CALL IF YOU HAVE PROBLEMS/CONCERNS Call if you are still struggling despite following these instructions. Call if you have concerns not answered by these instructions  ######################################################################    DIET: Follow a light bland diet & liquids the first 24 hours after arrival home, such as soup, liquids, starches, etc.  Be sure to drink plenty of fluids.  Quickly advance to a usual solid diet within a few days.  Avoid fast food or heavy meals initially as you are more likely to get nauseated or have irregular bowels.  Take your usually prescribed home medications unless otherwise directed.  PAIN CONTROL: Pain is best controlled by a usual combination of three different methods TOGETHER: Ice/Heat Over the counter pain medication Prescription pain medication Most patients will experience some swelling and bruising around the hernia(s) and scrotum.  Ice packs or heating pads (30-60 minutes up to 6 times a day) will help. Use ice for the first few days to help decrease swelling and bruising, then switch to heat to help relax tight/sore spots and speed recovery.  Some people prefer to use ice alone, heat alone, alternating between ice & heat.  Experiment to what works for you.  Swelling and bruising can take several weeks to resolve.   It is helpful to take an  over-the-counter pain medication regularly for the first days: Naproxen (Aleve, etc)  Two 220mg  tabs twice a day OR Ibuprofen (Advil, etc) Three 200mg  tabs four times a day (every meal & bedtime) AND Acetaminophen (Tylenol, etc) 325-650mg  four times a day (every meal & bedtime) A  prescription for pain medication should be given to you upon discharge.  Take your pain medication as prescribed, IF NEEDED.  If you are having problems/concerns with the prescription medicine (does not control pain, nausea, vomiting, rash, itching, etc), please call us [phone] to see if we need to switch you to a different pain medicine that will work better for you and/or control your side effect better. If you need a refill on your pain medication, please contact your pharmacy.  They will contact our office to request authorization. Prescriptions will not be filled after 5 pm or on week-ends.  Avoid getting constipated.  Between the surgery and the pain medications, it is common to experience some constipation.  Increasing fluid intake and taking a fiber supplement (such as Metamucil, Citrucel, FiberCon, MiraLax, etc) 1-2 times a day regularly will usually help prevent this problem from occurring.  A mild laxative (prune juice, Milk of Magnesia, MiraLax, etc) should be taken according to package directions if there are no bowel movements after 48 hours.    Wash / shower every day, starting 2 days after surgery.  You may shower over the skin glue which is waterproof.  No rubbing, scrubbing, lotions or ointments to incision(s). Do not soak or submerge incisions.  Glue will flake off after about 2 weeks.  You may leave the incisions open to air.  You may replace a dressing/Band-Aid to cover an incision for comfort if you wish.  Continue to shower over incision(s) after the dressing is off.  ACTIVITIES as tolerated:   You may resume regular (light) daily activities beginning the next day--such as daily self-care,  walking, climbing stairs--gradually increasing activities as tolerated.  Control your pain so that you can walk an hour a day.  If you can walk 30 minutes without difficulty, it is safe to try more intense activity such as jogging, treadmill, bicycling, low-impact aerobics, swimming, etc. Refrain from the most intensive and strenuous activity such as sit-ups, heavy lifting, contact sports, etc  Refrain from any heavy lifting or straining until 6 weeks after surgery.   DO NOT PUSH THROUGH PAIN.  Let pain be your guide: If it hurts to do something, don't do it.  Pain is your body warning you to avoid that activity for another week until the pain goes down. You may drive when you are no longer taking prescription pain medication, you can comfortably wear a seatbelt, and you can safely maneuver your car and apply brakes. You may have sexual intercourse when it is comfortable.   FOLLOW UP in our office Please call [facility] at [phone] to set up an appointment to see your surgeon in the office for a follow-up appointment approximately 2-3 weeks after your surgery. Make sure that you call for this appointment the day you arrive home to insure a convenient appointment time.  9.  If you have disability of FMLA / Family leave forms, please bring the forms to the office for processing.  (do not give to your surgeon).  WHEN TO CALL us [phone]: Poor pain control Reactions / problems with new medications (rash/itching, nausea, etc)  Fever over 101.5 F (38.5 C) Inability to urinate Nausea and/or vomiting Worsening swelling or bruising Continued bleeding from incision. Increased pain, redness, or drainage from the incision   The clinic staff is available to answer your questions during regular business hours (8:30am-5pm).  Please don't hesitate to call and ask to speak to one of our nurses for clinical concerns.   If you have a medical emergency, go to the nearest emergency room or call 911.  A  surgeon from [facility] is always on call at the hospitals in [facility], [place] [address], [place], [place]  [address] ?  [address] [address], [place], [place]   [address] MAIN: [phone] ? TOLL FREE: [phone] ? FAX: [phone] [email or web address]

## 2023-06-04 NOTE — Transfer of Care (Signed)
Immediate Anesthesia Transfer of Care Note  Patient: Wayne Silva  Procedure(s) Performed: XI ROBOTIC ASSISTED BILATERAL INGUINAL HERNIA REPAIR WITH MESH (Bilateral)  Patient Location: PACU  Anesthesia Type:General  Level of Consciousness: awake, alert , and oriented  Airway & Oxygen Therapy: Patient Spontanous Breathing and Patient connected to nasal cannula oxygen  Post-op Assessment: Report given to RN and Post -op Vital signs reviewed and stable  Post vital signs: Reviewed and stable  Last Vitals:  Vitals Value Taken Time  BP 127/78 06/04/23 0941  Temp    Pulse 83 06/04/23 0942  Resp 19 06/04/23 0943  SpO2 100 % 06/04/23 0942  Vitals shown include unfiled device data.  Last Pain:  Vitals:   06/04/23 0537  TempSrc: Oral  PainSc: 0-No pain         Complications: No notable events documented.

## 2023-06-04 NOTE — Anesthesia Procedure Notes (Signed)
Procedure Name: Intubation Date/Time: 06/04/2023 7:36 AM  Performed by: Hulan Fess, CRNAPre-anesthesia Checklist: Patient identified, Emergency Drugs available, Suction available and Patient being monitored Patient Re-evaluated:Patient Re-evaluated prior to induction Oxygen Delivery Method: Circle System Utilized Preoxygenation: Pre-oxygenation with 100% oxygen Induction Type: IV induction Ventilation: Mask ventilation without difficulty Laryngoscope Size: Mac and 4 Grade View: Grade I Tube type: Oral Tube size: 7.5 mm Number of attempts: 1 Airway Equipment and Method: Stylet and Oral airway Placement Confirmation: ETT inserted through vocal cords under direct vision, positive ETCO2 and breath sounds checked- equal and bilateral Secured at: 23 cm Tube secured with: Tape Dental Injury: Teeth and Oropharynx as per pre-operative assessment

## 2023-06-04 NOTE — H&P (Signed)
 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  He had his annual physical last month and reported a transient pain in the groin area a month prior.  He was noted to have a hernia in the left inguinal field.  Today he states he has been having bilateral groin pain and it is starting to interfere with his quality of life.  He is a Dietitian, works with computers and does not have to do heavy lifting or strenuous work, for exercise he typically does pull up/sit ups/push-ups but no significant heavy lifting.  No previous abdominal surgery but he has had hemorrhoid surgery and kidney stone removal in the past.  He did have lab work done last month which is unremarkable  Review of Systems: A complete review of systems was obtained from the patient.  I have reviewed this information and discussed as appropriate with the patient.  See HPI as well for other ROS.   Medical History: Past Medical History:  Diagnosis Date   Anxiety    Asthma, unspecified asthma severity, unspecified whether complicated, unspecified whether persistent ([facility]-HCC)    GERD (gastroesophageal reflux disease)     There is no problem list on file for this patient.   Past Surgical History:  Procedure Laterality Date   kidney stones       No Known Allergies  Current Outpatient Medications on File Prior to Visit  Medication Sig Dispense Refill   azelastine (ASTELIN) 137 mcg nasal spray 1 TO 2 SPRAYS IN EACH NOSTRIL TWICE DAILY     BREO ELLIPTA 200-25 mcg/dose DsDv Inhale 1 Puff into the lungs once daily     mirtazapine (REMERON) 15 MG tablet Take 1 tablet by mouth at bedtime     montelukast  (SINGULAIR) 10 mg tablet Take 10 mg by mouth once daily     RYALTRIS 665-25 mcg/spray Spry 2 sprays per nostril twice daily     sildenafil (REVATIO) 20 mg tablet TAKE 2 TO 5 TABLETS BY MOUTH EVERY 3 DAYS AS NEEDED FOR SEXUAL ACTIVITY     tadalafiL (CIALIS) 5 MG tablet Take 5 mg by mouth once daily     valACYclovir (VALTREX) 1000 MG tablet Take 1,000 mg by mouth once daily     No current facility-administered medications on file prior to visit.    Family History  Problem Relation Age of Onset   Skin cancer Mother    Colon cancer Father    Stroke Father      Social History   Tobacco Use  Smoking Status Former   Types: Cigarettes  Smokeless Tobacco Never     Social History   Socioeconomic History   Marital status: Single  Tobacco Use   Smoking status: Former    Types: Cigarettes   Smokeless tobacco: Never  Substance and Sexual Activity   Alcohol use: Not Currently   Drug use: Never    Objective:    Vitals:   03/20/23 1532  BP: 112/73  Pulse: 81  Temp: 36.7 C (98 F)  SpO2: 96%  Weight: 56.7 kg (125 lb)  Height: 170.2 cm (5\' 7" )    Body mass index is 19.58 kg/m.  Gen:  A&Ox3, no distress  Unlabored respiration Abdomen soft nontender; he has bilateral small inguinal hernias with Valsalva.  No overlying skin changes.  Assessment and Plan:  Diagnoses and all orders for this visit:  Non-recurrent bilateral inguinal hernia without obstruction or gangrene    We discussed the relevant anatomy and we discussed options for repair.  I recommend the minimally invasive/robotic approach and went over the technique of the procedure.  Discussed risks of bleeding, infection, pain, scarring, injury to structures in the area including nerves, blood vessels, bowel, bladder, risk of chronic pain, hernia recurrence, risk of seroma or hematoma, urinary retention, and risks of general anesthesia including cardiovascular, pulmonary, and thromboembolic complications.  We discussed  typical postop recovery, timeline, and activity limitations.  We also discussed the option of ongoing observation, with high rate of ultimately returning for surgery and risk of increasing size/symptoms from the hernia as well as incarceration/strangulation and went over symptoms that should prompt him to seek emergency treatment.  Questions were welcomed and answered to the patient's satisfaction. Patient wishes to proceed with scheduling.   Rosette Bellavance Carlye Grippe, MD

## 2023-06-04 NOTE — Anesthesia Postprocedure Evaluation (Signed)
Anesthesia Post Note  Patient: Wayne Silva  Procedure(s) Performed: XI ROBOTIC ASSISTED BILATERAL INGUINAL HERNIA REPAIR WITH MESH (Bilateral)     Patient location during evaluation: PACU Anesthesia Type: General Level of consciousness: awake and alert Pain management: pain level controlled Vital Signs Assessment: post-procedure vital signs reviewed and stable Respiratory status: spontaneous breathing, nonlabored ventilation, respiratory function stable and patient connected to nasal cannula oxygen Cardiovascular status: blood pressure returned to baseline and stable Postop Assessment: no apparent nausea or vomiting Anesthetic complications: no   No notable events documented.  Last Vitals:  Vitals:   06/04/23 1130 06/04/23 1145  BP: 107/68   Pulse:    Resp: 16   Temp:  (!) 36 C  SpO2:      Last Pain:  Vitals:   06/04/23 1145  TempSrc:   PainSc: Asleep                 [name]

## 2023-06-25 ENCOUNTER — Encounter: Payer: Self-pay | Admitting: Gastroenterology

## 2023-07-01 DIAGNOSIS — Z9889 Other specified postprocedural states: Secondary | ICD-10-CM | POA: Diagnosis not present

## 2023-07-01 DIAGNOSIS — J309 Allergic rhinitis, unspecified: Secondary | ICD-10-CM | POA: Diagnosis not present

## 2023-07-10 ENCOUNTER — Other Ambulatory Visit: Payer: Self-pay | Admitting: Internal Medicine

## 2023-07-10 DIAGNOSIS — F411 Generalized anxiety disorder: Secondary | ICD-10-CM

## 2023-07-13 ENCOUNTER — Ambulatory Visit (AMBULATORY_SURGERY_CENTER): Payer: BC Managed Care – PPO

## 2023-07-13 VITALS — Ht 67.0 in | Wt 130.0 lb

## 2023-07-13 DIAGNOSIS — Z800 Family history of malignant neoplasm of digestive organs: Secondary | ICD-10-CM

## 2023-07-13 MED ORDER — SUFLAVE 178.7 G PO SOLR: 1.0000 | Freq: Once | ORAL | 0 refills | Status: AC

## 2023-07-13 NOTE — Progress Notes (Signed)
 No egg or soy allergy known to patient  No issues known to pt with past sedation with any surgeries or procedures Patient denies ever being told they had issues or difficulty with intubation  No FH of Malignant Hyperthermia Pt is not on diet pills Pt is not on home 02  Pt is not on blood thinners  Pt denies issues with chronic constipation  No A fib or A flutter Have any cardiac testing pending--no Ambulates independently

## 2023-07-24 ENCOUNTER — Encounter: Payer: Self-pay | Admitting: Gastroenterology

## 2023-07-31 ENCOUNTER — Encounter: Payer: Self-pay | Admitting: Gastroenterology

## 2023-07-31 ENCOUNTER — Ambulatory Visit: Payer: BC Managed Care – PPO | Admitting: Gastroenterology

## 2023-07-31 VITALS — BP 94/59 | HR 65 | Temp 97.2°F | Resp 16 | Ht 67.0 in | Wt 130.0 lb

## 2023-07-31 DIAGNOSIS — Z800 Family history of malignant neoplasm of digestive organs: Secondary | ICD-10-CM

## 2023-07-31 DIAGNOSIS — D122 Benign neoplasm of ascending colon: Secondary | ICD-10-CM

## 2023-07-31 DIAGNOSIS — D124 Benign neoplasm of descending colon: Secondary | ICD-10-CM

## 2023-07-31 DIAGNOSIS — D123 Benign neoplasm of transverse colon: Secondary | ICD-10-CM | POA: Diagnosis not present

## 2023-07-31 DIAGNOSIS — Z1211 Encounter for screening for malignant neoplasm of colon: Secondary | ICD-10-CM

## 2023-07-31 DIAGNOSIS — K648 Other hemorrhoids: Secondary | ICD-10-CM | POA: Diagnosis not present

## 2023-07-31 DIAGNOSIS — K644 Residual hemorrhoidal skin tags: Secondary | ICD-10-CM | POA: Diagnosis not present

## 2023-07-31 MED ORDER — SODIUM CHLORIDE 0.9 % IV SOLN: 500.0000 mL | Freq: Once | INTRAVENOUS | Status: DC

## 2023-07-31 NOTE — Progress Notes (Signed)
 [facility] History and Physical   Primary Care Physician:  [name], MD   Reason for Procedure:  Colorectal cancer screening  Plan:    Screening colonoscopy with possible interventions as needed     HPI: Wayne Silva is a very pleasant 63 y.o. male here for screening colonoscopy. Denies any nausea, vomiting, abdominal pain, melena or bright red blood per rectum  The risks and benefits as well as alternatives of endoscopic procedure(s) have been discussed and reviewed. All questions answered. The patient agrees to proceed.    Past Medical History:  Diagnosis Date   Alcoholic (HCC)    Allergy    Anxiety    Asthma    Depression    History of kidney stones    Spastic bladder     Past Surgical History:  Procedure Laterality Date   HEMORRHOID SURGERY     HERNIA REPAIR  06/01/2023   KIDNEY STONE SURGERY     NASAL SEPTUM SURGERY      Prior to Admission medications   Medication Sig Start Date End Date Taking? Authorizing Provider  Azelastine-Fluticasone (DYMISTA) 137-50 MCG/ACT SUSP Place 2 sprays into both nostrils 2 (two) times daily. 04/17/23  Yes Ferol Luz, MD  mirtazapine (REMERON) 15 MG tablet TAKE 1 TABLET BY MOUTH EVERYDAY AT BEDTIME 07/13/23  Yes [name], MD  montelukast (SINGULAIR) 10 MG tablet Take 1 tablet (10 mg total) by mouth daily. 05/11/23  Yes Ferol Luz, MD  Olopatadine-Mometasone Cristal [phone] MCG/ACT SUSP 2 sprays per nostril twice daily 04/14/23  Yes Ferol Luz, MD  tadalafil (CIALIS) 5 MG tablet Take 5 mg by mouth daily.   Yes [provider]  valACYclovir (VALTREX) 1000 MG tablet Take 1,000 mg by mouth daily as needed. 06/15/23  Yes [provider]  Albuterol-Budesonide (AIRSUPRA) 90-80 MCG/ACT AERO Inhale 2 puffs into the lungs 4 (four) times daily as needed. 03/03/23   [name], MD  BREO ELLIPTA 200-25 MCG/ACT AEPB INHALE 1 PUFF BY MOUTH EVERY DAY 10/15/22   Ferol Luz, MD     Current Outpatient Medications  Medication Sig Dispense Refill   Azelastine-Fluticasone (DYMISTA) 137-50 MCG/ACT SUSP Place 2 sprays into both nostrils 2 (two) times daily. 23 g 5   mirtazapine (REMERON) 15 MG tablet TAKE 1 TABLET BY MOUTH EVERYDAY AT BEDTIME 30 tablet 2   montelukast (SINGULAIR) 10 MG tablet Take 1 tablet (10 mg total) by mouth daily. 90 tablet 1   Olopatadine-Mometasone (RYALTRIS) 665-25 MCG/ACT SUSP 2 sprays per nostril twice daily 29 g 5   tadalafil (CIALIS) 5 MG tablet Take 5 mg by mouth daily.     valACYclovir (VALTREX) 1000 MG tablet Take 1,000 mg by mouth daily as needed.     Albuterol-Budesonide (AIRSUPRA) 90-80 MCG/ACT AERO Inhale 2 puffs into the lungs 4 (four) times daily as needed. 32.1 g 1   BREO ELLIPTA 200-25 MCG/ACT AEPB INHALE 1 PUFF BY MOUTH EVERY DAY 180 each 0   Current Facility-Administered Medications  Medication Dose Route Frequency Provider Last Rate Last Admin   0.9 %  sodium chloride infusion  500 mL Intravenous Once Napoleon Form, MD        Allergies as of 07/31/2023   (No Known Allergies)    Family History  Problem Relation Age of Onset   Skin cancer Mother    Colon cancer Father        dx in his late 89s or early 49s   Alzheimer's disease Father  Alzheimer's disease Maternal Grandmother    Pancreatic cancer Paternal Grandfather    Cancer Paternal Grandfather    Allergic rhinitis Neg Hx    Angioedema Neg Hx    Asthma Neg Hx    Eczema Neg Hx    Urticaria Neg Hx    Immunodeficiency Neg Hx    Rectal cancer Neg Hx    Stomach cancer Neg Hx    Esophageal cancer Neg Hx     Social History   Socioeconomic History   Marital status: Single    Spouse name: Not on file   Number of children: Not on file   Years of education: Not on file   Highest education level: Not on file  Occupational History   Not on file  Tobacco Use   Smoking status: Former    Current packs/day: 0.00    Average packs/day: 0.3 packs/day for 10.0  years (2.5 ttl pk-yrs)    Types: Cigarettes    Start date: 08/23/1994    Quit date: 08/22/2004    Years since quitting: 18.9   Smokeless tobacco: Never  Vaping Use   Vaping status: Never Used  Substance and Sexual Activity   Alcohol use: No    Alcohol/week: 0.0 standard drinks of alcohol    Comment: was an alcoholic (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 ago as of 01/18/20)   Drug use: No   Sexual activity: Yes    Partners: Female    Birth control/protection: Condom  Other Topics Concern   Not on file  Social History Narrative   Not on file   Social Drivers of Health   Financial Resource Strain: Not on file  Food Insecurity: Not on file  Transportation Needs: Not on file  Physical Activity: Not on file  Stress: Not on file  Social Connections: Not on file  Intimate Partner Violence: Not on file    Review of Systems:  All other review of systems negative except as mentioned in the HPI.  Physical Exam: Vital signs in last 24 hours: BP 126/70   Pulse 66   Temp (!) 97.2 F (36.2 C) (Temporal)   Ht 5\' 7"  (1.702 m)   Wt 130 lb (59 kg)   SpO2 99%   BMI 20.36 kg/m  General:   Alert, NAD Lungs:  Clear .   Heart:  Regular rate and rhythm Abdomen:  Soft, nontender and nondistended. Neuro/Psych:  Alert and cooperative. Normal mood and affect. A and O x 3  Reviewed labs, radiology imaging, old records and pertinent past GI work up  Patient is appropriate for planned procedure(s) and anesthesia in an ambulatory setting   K. Scherry Ran , MD [phone]

## 2023-07-31 NOTE — Progress Notes (Signed)
 Called to room to assist during endoscopic procedure.  Patient ID and intended procedure confirmed with present staff. Received instructions for my participation in the procedure from the performing physician.

## 2023-07-31 NOTE — Op Note (Signed)
 [facility] Patient Name: Wayne Silva Procedure Date: 07/31/2023 1:00 PM MRN: [id number] Endoscopist: Napoleon Form , MD, [id number] Age: 63 Referring MD:  Date of Birth: 06/30/1960 Gender: Male Account #: [id number] Procedure:                Colonoscopy Indications:              Screening in patient at increased risk: Family                            history of 1st-degree relative with colorectal                            cancer Medicines:                Monitored Anesthesia Care Procedure:                Pre-Anesthesia Assessment:                           - Prior to the procedure, a History and Physical                            was performed, and patient medications and                            allergies were reviewed. The patient's tolerance of                            previous anesthesia was also reviewed. The risks                            and benefits of the procedure and the sedation                            options and risks were discussed with the patient.                            All questions were answered, and informed consent                            was obtained. Prior Anticoagulants: The patient has                            taken no anticoagulant or antiplatelet agents. ASA                            Grade Assessment: II - A patient with mild systemic                            disease. After reviewing the risks and benefits,                            the patient was deemed in satisfactory condition to  undergo the procedure.                           After obtaining informed consent, the colonoscope                            was passed under direct vision. Throughout the                            procedure, the patient's blood pressure, pulse, and                            oxygen saturations were monitored continuously. The                            Olympus Scope [id number] was introduced through the                             anus and advanced to the the cecum, identified by                            appendiceal orifice and ileocecal valve. The                            colonoscopy was performed without difficulty. The                            patient tolerated the procedure well. The quality                            of the bowel preparation was good. The ileocecal                            valve, appendiceal orifice, and rectum were                            photographed. Scope In: 1:27:47 PM Scope Out: 1:46:14 PM Scope Withdrawal Time: 0 hours 12 minutes 29 seconds  Total Procedure Duration: 0 hours 18 minutes 27 seconds  Findings:                 The perianal and digital rectal examinations were                            normal.                           Two sessile polyps were found in the descending                            colon and transverse colon. The polyps were 4 to 5                            mm in size. These polyps were removed with a cold  snare. Resection and retrieval were complete.                           Non-bleeding external and internal hemorrhoids were                            found during retroflexion. The hemorrhoids were                            small. Complications:            No immediate complications. Estimated Blood Loss:     Estimated blood loss was minimal. Impression:               - Two 4 to 5 mm polyps in the descending colon and                            in the transverse colon, removed with a cold snare.                            Resected and retrieved.                           - Non-bleeding external and internal hemorrhoids. Recommendation:           - Patient has a contact number available for                            emergencies. The signs and symptoms of potential                            delayed complications were discussed with the                            patient. Return to normal  activities tomorrow.                            Written discharge instructions were provided to the                            patient.                           - Resume previous diet.                           - Continue present medications.                           - Await pathology results.                           - Repeat colonoscopy in 5 years for surveillance                            based on pathology results. Napoleon Form, MD 07/31/2023 1:51:26 PM This report has been signed  electronically.

## 2023-07-31 NOTE — Progress Notes (Signed)
 Pt's states no medical or surgical changes since previsit or office visit.

## 2023-07-31 NOTE — Patient Instructions (Signed)

## 2023-07-31 NOTE — Progress Notes (Signed)
 Pt sedate, gd SR's, VSS, report to RN

## 2023-08-03 ENCOUNTER — Telehealth: Payer: Self-pay

## 2023-08-03 NOTE — Telephone Encounter (Signed)
  Follow up Call-     07/31/2023   12:58 PM  Call back number  Post procedure Call Back phone  # [phone]  Permission to leave phone message Yes     Patient questions:  Do you have a fever, pain , or abdominal swelling? No. Pain Score  0 *  Have you tolerated food without any problems? Yes.    Have you been able to return to your normal activities? Yes.    Do you have any questions about your discharge instructions: Diet   No. Medications  No. Follow up visit  No.  Do you have questions or concerns about your Care? No.  Actions: * If pain score is 4 or above: No action needed, pain <4.

## 2023-08-05 LAB — SURGICAL PATHOLOGY

## 2023-08-31 ENCOUNTER — Ambulatory Visit (INDEPENDENT_AMBULATORY_CARE_PROVIDER_SITE_OTHER): Payer: BC Managed Care – PPO | Admitting: Internal Medicine

## 2023-08-31 ENCOUNTER — Encounter: Payer: Self-pay | Admitting: Internal Medicine

## 2023-08-31 VITALS — BP 118/70 | HR 67 | Temp 98.3°F | Resp 16 | Ht 67.0 in | Wt 129.0 lb

## 2023-08-31 DIAGNOSIS — K4091 Unilateral inguinal hernia, without obstruction or gangrene, recurrent: Secondary | ICD-10-CM | POA: Diagnosis not present

## 2023-08-31 DIAGNOSIS — R42 Dizziness and giddiness: Secondary | ICD-10-CM | POA: Diagnosis not present

## 2023-08-31 DIAGNOSIS — J4520 Mild intermittent asthma, uncomplicated: Secondary | ICD-10-CM

## 2023-08-31 DIAGNOSIS — Z9189 Other specified personal risk factors, not elsewhere classified: Secondary | ICD-10-CM | POA: Diagnosis not present

## 2023-08-31 DIAGNOSIS — R200 Anesthesia of skin: Secondary | ICD-10-CM | POA: Diagnosis not present

## 2023-08-31 DIAGNOSIS — K219 Gastro-esophageal reflux disease without esophagitis: Secondary | ICD-10-CM | POA: Diagnosis not present

## 2023-08-31 LAB — CBC WITH DIFFERENTIAL/PLATELET
Basophils Absolute: 0 10*3/uL (ref 0.0–0.1)
Basophils Relative: 0.5 % (ref 0.0–3.0)
Eosinophils Absolute: 0.1 10*3/uL (ref 0.0–0.7)
Eosinophils Relative: 2 % (ref 0.0–5.0)
HCT: 43.3 % (ref 39.0–52.0)
Hemoglobin: 14.5 g/dL (ref 13.0–17.0)
Lymphocytes Relative: 18.7 % (ref 12.0–46.0)
Lymphs Abs: 1.3 10*3/uL (ref 0.7–4.0)
MCHC: 33.6 g/dL (ref 30.0–36.0)
MCV: 92.2 fl (ref 78.0–100.0)
Monocytes Absolute: 0.5 10*3/uL (ref 0.1–1.0)
Monocytes Relative: 7.7 % (ref 3.0–12.0)
Neutro Abs: 5 10*3/uL (ref 1.4–7.7)
Neutrophils Relative %: 71.1 % (ref 43.0–77.0)
Platelets: 192 10*3/uL (ref 150.0–400.0)
RBC: 4.7 Mil/uL (ref 4.22–5.81)
RDW: 13.7 % (ref 11.5–15.5)
WBC: 7 10*3/uL (ref 4.0–10.5)

## 2023-08-31 LAB — BASIC METABOLIC PANEL WITH GFR
BUN: 19 mg/dL (ref 6–23)
CO2: 31 meq/L (ref 19–32)
Calcium: 9.3 mg/dL (ref 8.4–10.5)
Chloride: 102 meq/L (ref 96–112)
Creatinine, Ser: 1 mg/dL (ref 0.40–1.50)
GFR: 80.91 mL/min (ref >60.00)
Glucose, Bld: 95 mg/dL (ref 70–99)
Potassium: 4.5 meq/L (ref 3.5–5.1)
Sodium: 138 meq/L (ref 135–145)

## 2023-08-31 LAB — C-REACTIVE PROTEIN: CRP: <1 mg/dL (ref 0.5–20.0)

## 2023-08-31 MED ORDER — MECLIZINE HCL 12.5 MG PO TABS: 12.5000 mg | ORAL_TABLET | Freq: Three times a day (TID) | ORAL | 1 refills | Status: DC | PRN

## 2023-08-31 NOTE — Progress Notes (Signed)
 Subjective:  Patient ID: Wayne Silva, male    DOB: 04-08-1961  Age: 62 y.o. MRN: [id number]  CC: Follow-up   HPI Wayne Silva presents for f/up ----  Discussed the use of AI scribe software for clinical note transcription with the patient, who gave verbal consent to proceed.  History of Present Illness   Wayne Silva is a 63 year old male who presents with dizziness and vertigo.  He has been experiencing dizzy spells for approximately two and a half weeks, describing the sensation as 'spinning' primarily occurring in the morning upon waking and at night. The dizziness is transient, resolving after kneeling down for a short period. Initially, the symptoms seemed to improve but have persisted. No nausea or vomiting is associated with these episodes.  He mentions a sensation at the top of his head but clarifies it is not a headache. There is no involvement of vision, slurred speech, or significant trouble walking, although he sometimes feels off balance during the dizzy spells. No ear pain or popping is present. He has not experienced similar symptoms in the past.  He associates the onset of symptoms with increased stress from a work project and has noticed dizziness after stressful meetings. He also mentions using neurofeedback therapy, which he increased to up to eight times a week, but has since stopped for the past two and a half weeks, suspecting it might be related to his symptoms.  He has a history of hernia surgery over a year ago and experiences pain in the hernia area during exercise, specifically rowing. He has not noticed any bulging at the site.  Additionally, he reports numbness around the nose and sometimes extending to the lip since having nasal surgery, though he does not recall the exact timing of the surgery.       Outpatient Medications Prior to Visit  Medication Sig Dispense Refill   Azelastine -Fluticasone  (DYMISTA ) 137-50 MCG/ACT SUSP Place 2 sprays into both nostrils 2  (two) times daily. 23 g 5   mirtazapine  (REMERON ) 15 MG tablet TAKE 1 TABLET BY MOUTH EVERYDAY AT BEDTIME 30 tablet 2   montelukast  (SINGULAIR ) 10 MG tablet Take 1 tablet (10 mg total) by mouth daily. 90 tablet 1   Olopatadine-Mometasone  (RYALTRIS ) 665-25 MCG/ACT SUSP 2 sprays per nostril twice daily 29 g 5   tadalafil (CIALIS) 5 MG tablet Take 5 mg by mouth daily.     valACYclovir (VALTREX) 1000 MG tablet Take 1,000 mg by mouth daily as needed.     Albuterol -Budesonide  (AIRSUPRA ) 90-80 MCG/ACT AERO Inhale 2 puffs into the lungs 4 (four) times daily as needed. 32.1 g 1   BREO ELLIPTA  200-25 MCG/ACT AEPB INHALE 1 PUFF BY MOUTH EVERY DAY 180 each 0   No facility-administered medications prior to visit.    ROS Review of Systems  Constitutional:  Negative for appetite change, chills, diaphoresis, fatigue and fever.  HENT: Negative.    Respiratory:  Positive for wheezing. Negative for cough, chest tightness and shortness of breath.   Cardiovascular:  Negative for chest pain, palpitations and leg swelling.  Gastrointestinal: Negative.  Negative for abdominal pain, constipation, diarrhea, nausea and rectal pain.  Endocrine: Negative.   Genitourinary: Negative.  Negative for difficulty urinating.  Musculoskeletal: Negative.   Skin: Negative.   Neurological:  Positive for dizziness and numbness. Negative for syncope, speech difficulty, weakness, light-headedness and headaches.  Hematological:  Negative for adenopathy. Does not bruise/bleed easily.  Psychiatric/Behavioral:  The patient is nervous/anxious.     Objective:  BP 118/70 (  BP Location: Left Arm, Patient Position: Sitting, Cuff Size: Small)   Pulse 67   Temp 98.3 F (36.8 C) (Oral)   Resp 16   Ht 5\' 7"  (1.702 m)   Wt 129 lb (58.5 kg)   SpO2 98%   BMI 20.20 kg/m   BP Readings from Last 3 Encounters:  08/31/23 118/70  07/31/23 (!) 94/59  06/04/23 107/68    Wt Readings from Last 3 Encounters:  08/31/23 129 lb (58.5 kg)   07/31/23 130 lb (59 kg)  07/13/23 130 lb (59 kg)    Physical Exam Vitals reviewed.  Constitutional:      Appearance: Normal appearance.  HENT:     Right Ear: Hearing, tympanic membrane, ear canal and external ear normal. No middle ear effusion.     Left Ear: Tympanic membrane, ear canal and external ear normal.  No middle ear effusion.     Mouth/Throat:     Mouth: Mucous membranes are moist.  Eyes:     General: No scleral icterus.    Extraocular Movements: Extraocular movements intact.     Conjunctiva/sclera: Conjunctivae normal.     Pupils: Pupils are equal, round, and reactive to light.  Cardiovascular:     Rate and Rhythm: Normal rate and regular rhythm.     Heart sounds: No murmur heard.    No friction rub. No gallop.  Pulmonary:     Breath sounds: Examination of the right-middle field reveals wheezing. Examination of the left-middle field reveals wheezing. Examination of the right-lower field reveals wheezing. Wheezing present. No decreased breath sounds, rhonchi or rales.  Abdominal:     General: Abdomen is flat.     Palpations: There is no mass.     Tenderness: There is no abdominal tenderness. There is no guarding.     Hernia: A hernia is present. Hernia is present in the left inguinal area. There is no hernia in the right inguinal area.  Genitourinary:    Epididymis:     Right: Normal.     Left: Normal.  Musculoskeletal:        General: Normal range of motion.     Right lower leg: No edema.     Left lower leg: No edema.  Lymphadenopathy:     Lower Body: No right inguinal adenopathy. No left inguinal adenopathy.  Skin:    General: Skin is warm and dry.  Neurological:     General: No focal deficit present.     Mental Status: He is alert and oriented to person, place, and time. Mental status is at baseline.     Cranial Nerves: Cranial nerves 2-12 are intact.     Sensory: Sensation is intact.     Motor: Motor function is intact.     Coordination: Coordination is  intact.     Gait: Gait is intact. Gait and tandem walk normal.     Deep Tendon Reflexes: Reflexes normal. Babinski sign present on the right side. Babinski sign present on the left side.     Reflex Scores:      Tricep reflexes are 1+ on the right side and 1+ on the left side.      Bicep reflexes are 1+ on the right side and 1+ on the left side.      Brachioradialis reflexes are 1+ on the right side and 1+ on the left side.      Patellar reflexes are 2+ on the right side and 2+ on the left side.  Achilles reflexes are 1+ on the right side. Psychiatric:        Attention and Perception: Attention normal. He is attentive. He does not perceive auditory or visual hallucinations.        Mood and Affect: Mood is anxious.        Speech: Speech normal.        Behavior: Behavior normal.        Thought Content: Thought content normal.        Cognition and Memory: Cognition normal.     Lab Results  Component Value Date   WBC 7.0 08/31/2023   HGB 14.5 08/31/2023   HCT 43.3 08/31/2023   PLT 192.0 08/31/2023   GLUCOSE 95 08/31/2023   CHOL 210 (H) 03/03/2023   TRIG 48.0 03/03/2023   HDL 78.40 03/03/2023   LDLCALC 122 (H) 03/03/2023   ALT 14 03/03/2023   AST 19 03/03/2023   NA 138 08/31/2023   K 4.5 08/31/2023   CL 102 08/31/2023   CREATININE 1.00 08/31/2023   BUN 19 08/31/2023   CO2 31 08/31/2023   TSH 2.86 03/03/2023   PSA 1.56 03/03/2023   HGBA1C 5.7 01/09/2021    [name] Contrast Result Date: 09/03/2023 CLINICAL DATA:  Provided history: Vertigo. Facial numbness. Numbness or tingling, paresthesia. EXAM: MRI HEAD WITHOUT CONTRAST TECHNIQUE: Multiplanar, multiecho pulse sequences of the brain and surrounding structures were obtained without intravenous contrast. COMPARISON:  Brain MRI 10/03/2014. FINDINGS: A routine protocol non-contrast brain MRI was ordered and performed. Brain: No age-advanced or lobar predominant cerebral atrophy. No cortical encephalomalacia is identified. No  significant cerebral white matter disease. There is no acute infarct. No evidence of an intracranial mass. No chronic intracranial blood products. No extra-axial fluid collection. No midline shift. Vascular: Maintained flow voids within the proximal large arterial vessels. Skull and upper cervical spine: No focal worrisome marrow lesion. Sinuses/Orbits: No mass or acute finding within the imaged orbits. No significant paranasal sinus disease. Other: Trace fluid within right mastoid air cells. IMPRESSION: 1. Unremarkable non-contrast MRI appearance of the brain. No evidence of an intracranial mass or an acute intracranial abnormality. 2. Trace fluid within right mastoid air cells. Electronically Signed   By: Bascom Lily D.O.   On: 09/03/2023 16:16     Assessment & Plan:  Vertigo -     [name] CONTRAST; Future -     Meclizine  HCl; Take 1 tablet (12.5 mg total) by mouth 3 (three) times daily as needed for dizziness.  Dispense: 30 tablet; Refill: 1 -     C-reactive protein; Future -     CBC with Differential/Platelet; Future -     Basic metabolic panel with GFR; Future -     Rubeola antibody IgG; Future  Facial numbness- MRI is normal. -     [name] CONTRAST; Future -     C-reactive protein; Future -     CBC with Differential/Platelet; Future -     Basic metabolic panel with GFR; Future -     Rubeola antibody IgG; Future  Recurrent left inguinal hernia -     Ambulatory referral to General Surgery  At increased risk for exposure to communicable disease -     Rubeola antibody IgG; Future  Gastroesophageal reflux disease without esophagitis -     CBC with Differential/Platelet; Future -     Basic metabolic panel with GFR; Future  Mild intermittent asthma without complication -     Airsupra ; Inhale 2 puffs  into the lungs 4 (four) times daily as needed.  Dispense: 32.1 g; Refill: 1 -     Budesonide -Formoterol  Fumarate; Inhale 2 puffs into the lungs 2 (two) times daily.  Dispense: 3  each; Refill: 1     Follow-up: Return if symptoms worsen or fail to improve.  Sandra Crouch, MD

## 2023-08-31 NOTE — Patient Instructions (Signed)
 Vertigo Vertigo is the feeling that you or the things around you are moving or spinning when they're not. It's different than feeling dizzy. It can also cause: Loss of balance. Trouble standing or walking. Nausea and vomiting. This feeling can come and go at any time. It can last from a few seconds to minutes or even hours. It may go away on its own or be treated with medicine. What are the types of vertigo? There are two types of vertigo: Peripheral vertigo happens when parts of your inner ear don't work like they should. This is the more common type. Central vertigo happens when your brain and spinal cord don't work like they should. Your health care provider will do tests to find out what kind of vertigo you have. This will help them decide on the right treatment for you. Follow these instructions at home: Eating and drinking Drink enough fluid to keep your pee (urine) pale yellow. Do not drink alcohol. Activity When you get up in the morning, first sit up on the side of the bed. When you feel okay, stand slowly while holding onto something. Move slowly. Avoid sudden body or head movements. Avoid certain positions, as told by your provider. Use a cane if you have trouble standing or walking. Sit down right away if you feel unsteady. Place items in your home so they're easy for you to reach without bending or leaning over. Return to normal activities when you're told. Ask what things are safe for you to do. General instructions Take your medicines only as told by your provider. Contact a health care provider if: Your medicines don't help or make your vertigo worse. You get new symptoms. You have a fever. You have nausea or vomiting. Your family or friends spot any changes in how you're acting. A part of your body goes numb. You feel tingling and prickling in a part of your body. You get very bad headaches. Get help right away if: You're always dizzy or you faint. You have a  stiff neck. You have trouble moving or speaking. Your hands, arms, or legs feel weak. Your hearing or eyesight changes. These symptoms may be an emergency. Call 911 right away. Do not wait to see if the symptoms will go away. Do not drive yourself to the hospital. This information is not intended to replace advice given to you by your health care provider. Make sure you discuss any questions you have with your health care provider. Document Revised: 01/29/2023 Document Reviewed: 08/01/2022 Elsevier Patient Education  2024 [employer].

## 2023-09-01 ENCOUNTER — Other Ambulatory Visit: Payer: Self-pay | Admitting: Internal Medicine

## 2023-09-01 LAB — RUBEOLA ANTIBODY IGG: Rubeola IgG: 17.7 [AU]/ml

## 2023-09-02 ENCOUNTER — Encounter: Payer: Self-pay | Admitting: Internal Medicine

## 2023-09-03 ENCOUNTER — Ambulatory Visit
Admission: RE | Admit: 2023-09-03 | Discharge: 2023-09-03 | Disposition: A | Discharge status: Home / Self Care | Source: Ambulatory Visit | Attending: Internal Medicine | Admitting: Internal Medicine

## 2023-09-03 ENCOUNTER — Encounter: Payer: Self-pay | Admitting: Internal Medicine

## 2023-09-03 DIAGNOSIS — J4520 Mild intermittent asthma, uncomplicated: Secondary | ICD-10-CM | POA: Insufficient documentation

## 2023-09-03 DIAGNOSIS — R200 Anesthesia of skin: Secondary | ICD-10-CM

## 2023-09-03 DIAGNOSIS — R202 Paresthesia of skin: Secondary | ICD-10-CM | POA: Diagnosis not present

## 2023-09-03 DIAGNOSIS — R42 Dizziness and giddiness: Secondary | ICD-10-CM | POA: Diagnosis not present

## 2023-09-03 MED ORDER — BUDESONIDE-FORMOTEROL FUMARATE 80-4.5 MCG/ACT IN AERO: 2.0000 | INHALATION_SPRAY | Freq: Two times a day (BID) | RESPIRATORY_TRACT | 1 refills | Status: AC

## 2023-09-03 MED ORDER — AIRSUPRA 90-80 MCG/ACT IN AERO: 2.0000 | INHALATION_SPRAY | Freq: Four times a day (QID) | RESPIRATORY_TRACT | 1 refills | Status: AC | PRN

## 2023-09-16 ENCOUNTER — Encounter: Payer: Self-pay | Admitting: Internal Medicine

## 2023-09-18 ENCOUNTER — Other Ambulatory Visit: Payer: Self-pay | Admitting: Internal Medicine

## 2023-09-18 DIAGNOSIS — R42 Dizziness and giddiness: Secondary | ICD-10-CM

## 2023-09-21 NOTE — Therapy (Signed)
 OUTPATIENT PHYSICAL THERAPY VESTIBULAR EVALUATION     Patient Name: Wayne Silva MRN: [id number] DOB:26-Dec-1960, 63 y.o., male Today's Date: 09/22/2023  END OF SESSION:  PT End of Session - 09/22/23 1155     Visit Number 1    Number of Visits 5    Date for PT Re-Evaluation 10/20/23    Authorization Type [employer]    PT Start Time 1022    PT Stop Time 1100    PT Time Calculation (min) 38 min    Activity Tolerance Patient tolerated treatment well    Behavior During Therapy WFL for tasks assessed/performed             Past Medical History:  Diagnosis Date   Alcoholic (HCC)    Allergy     Anxiety    Asthma    Depression    History of kidney stones    Spastic bladder    Past Surgical History:  Procedure Laterality Date   HEMORRHOID SURGERY     HERNIA REPAIR  06/01/2023   KIDNEY STONE SURGERY     NASAL SEPTUM SURGERY     Patient Active Problem List   Diagnosis Date Noted   Mild intermittent asthma without complication 09/03/2023   Vertigo 08/31/2023   Facial numbness 08/31/2023   Recurrent left inguinal hernia 08/31/2023   At increased risk for exposure to communicable disease 08/31/2023   Bradycardia on ECG 03/03/2023   Direct left inguinal hernia 03/03/2023   Allergic rhinitis 03/03/2022   Chronic idiopathic constipation 04/30/2021   Encounter for general adult medical examination with abnormal findings 10/31/2019   Depression with somatization 10/27/2019   Seasonal allergic rhinitis due to pollen 06/21/2018   Moderate persistent asthma without complication 06/21/2018   Gastroesophageal reflux disease without esophagitis 06/21/2018   MCI (mild cognitive impairment) 05/01/2014   Cough variant asthma 05/01/2014   IC (interstitial cystitis) 08/26/2013   GAD (generalized anxiety disorder) 03/02/2013    PCP: Arcadio Knuckles, MD REFERRING PROVIDER: Arcadio Knuckles, MD   REFERRING DIAG:   THERAPY DIAG:  Dizziness and giddiness  Unsteadiness on feet  ONSET  DATE: 1 month ago  Rationale for Evaluation and Treatment: Rehabilitation  SUBJECTIVE:   SUBJECTIVE STATEMENT: Patient reports dizziness started about a month ago. First noticed it when he got up from bed. Had to kneel down to let it slow down. Aggravated by getting out of bed, get up to walk from a chair, starting a physical activity like dancing. Denies head trauma, infection/illness, vision changes/double vision, hearing loss, migraines. Reports occasional sensation of rocking. Reports N/T in feet and lower legs- reports this is something he has noticed more since dizziness started. Denies hx of lumbar issues or neuropathy. Uses a neurofeedback machine on his head- reports that he feels that he may have over-done it using this.  Pt accompanied by: self  PERTINENT HISTORY: Alcohol abuse, anxiety, asthma, depression  PAIN:  Are you having pain? No  PRECAUTIONS: None  RED FLAGS: None   WEIGHT BEARING RESTRICTIONS: No  FALLS: Has patient fallen in last 6 months? No  LIVING ENVIRONMENT: Lives with: lives alone Lives in: Other condo Stairs: steps to enter with rail Has following equipment at home: None  PLOF: Independent, Vocation/Vocational requirements: working full time requiring computer work, and Leisure: likes fishing, dancing, running  PATIENT GOALS: improve dizziness   OBJECTIVE:  Note: Objective measures were completed at Evaluation unless otherwise noted.  DIAGNOSTIC FINDINGS: 09/03/23 brain MRI: Unremarkable non-contrast MRI appearance of the brain. No  evidence of an intracranial mass or an acute intracranial abnormality. 2. Trace fluid within right mastoid air cells.   COGNITION: Overall cognitive status: Within functional limits for tasks assessed   SENSATION: Reports N/T in feet, lower legs- reports this is something he has noticed more since dizziness started  POSTURE:  rounded shoulders  GAIT: Gait pattern: WFL Assistive device utilized: None Level  of assistance: Complete Independence   PATIENT SURVEYS:  DHI 12/100  VESTIBULAR ASSESSMENT:   GENERAL OBSERVATION: pt wears progressive lenses    OCULOMOTOR EXAM: Ocular Alignment: Cover/Uncover Test: WNL Cover/Cross Cover Test: WNL   Ocular ROM: No Limitations   Spontaneous Nystagmus: absent   Gaze-Induced Nystagmus: absent   Smooth Pursuits: intact   Saccades: intact   Convergence/Divergence: 4 cm    VESTIBULAR - OCULAR REFLEX:    Slow VOR: Normal; c/o mild dizziness with horizontal and vertical directions    VOR Cancellation: Normal   Head-Impulse Test: HIT Right: negative HIT Left: negative      POSITIONAL TESTING:  Right Roll Test: negative Left Roll Test: negative   Right Loaded Dix-Hallpike: negative; c/o mild dizziness upon lying down and sitting up  Left Loaded Dix-Hallpike: negative; c/o mild dizziness upon sitting up                                                                                                                               TREATMENT DATE: 09/22/23   PATIENT EDUCATION: Education details: exam findings, prognosis, POC, HEP Person educated: Patient Education method: Explanation, Demonstration, Tactile cues, Verbal cues, and Handouts Education comprehension: verbalized understanding and returned demonstration  HOME EXERCISE PROGRAM: Access Code: [email or web address] URL: [email or web address] Date: 09/22/2023 Prepared by: [facility] - Outpatient  Rehab - [facility]  Exercises - Brandt-Daroff Vestibular Exercise  - 1 x daily - 5 x weekly - 2 sets - 3-5 reps   GOALS: Goals reviewed with patient? Yes  SHORT TERM GOALS: Target date: 10/06/2023  Patient to be independent with initial HEP. Baseline: HEP initiated Goal status: INITIAL    LONG TERM GOALS: Target date: 10/20/2023  Patient to be independent with advanced HEP. Baseline: Not yet initiated  Goal status: INITIAL  Patient will report 0/10 dizziness with bed  mobility.  Baseline: Symptomatic  Goal status: INITIAL  Patient to demonstrate mild-moderate sway with M-CTSIB condition with eyes closed/foam surface in order to improve safety in environments with uneven surfaces and dim lighting. Baseline: NT Goal status: INITIAL  Patient to score at least 23/30 on FGA in order to decrease risk of falls. Baseline: NT Goal status: INITIAL  Patient to score at least 18 points less on DHI in order to meet MCID and improve functional outcomes.  Baseline: 12 Goal status: INITIAL   ASSESSMENT:  CLINICAL IMPRESSION:   Patient is a 63 y/o M presenting to OPPT with c/o dizziness for the past month. Denies head trauma, infection/illness, vision changes/double vision, hearing loss, migraines. Patient today presented  with normal vestibular exam; only with motion sensitivity upon sitting up from B DH. Patient was educated on gentle habituation HEP and reported understanding. Would benefit from skilled PT services 1x/week for 4 weeks to address aforementioned impairments in order to optimize level of function.    OBJECTIVE IMPAIRMENTS: decreased activity tolerance and dizziness.   ACTIVITY LIMITATIONS: bending, standing, and bed mobility  PARTICIPATION LIMITATIONS: cleaning, laundry, community activity, and yard work  PERSONAL FACTORS: Age and 3+ comorbidities: Alcohol abuse, anxiety, asthma, depression are also affecting patient's functional outcome.   REHAB POTENTIAL: Good  CLINICAL DECISION MAKING: Evolving/moderate complexity  EVALUATION COMPLEXITY: Moderate   PLAN:  PT FREQUENCY: 1x/week  PT DURATION: 4 weeks  PLANNED INTERVENTIONS: 97164- PT Re-evaluation, 97750- Physical Performance Testing, 97110-Therapeutic exercises, 97530- Therapeutic activity, [id number]- Neuromuscular re-education, 97535- Self Care, [id number]- Manual therapy, [id number]- Gait training, [phone]- Canalith repositioning, Patient/Family education, Balance training, Stair training, Taping,  Dry Needling, Vestibular training, Cryotherapy, and Moist heat  PLAN FOR NEXT SESSION: FGA, MCTSIB, review and progress habituation; orthostatics?    Thaddeus Filippo, PT, DPT 09/22/23 12:43 PM  [facility] [address] [place], [address] [place], [place] [address] Phone # [phone] Fax # [phone]

## 2023-09-22 ENCOUNTER — Ambulatory Visit: Payer: Self-pay | Admitting: Gastroenterology

## 2023-09-22 ENCOUNTER — Encounter: Payer: Self-pay | Admitting: Physical Therapy

## 2023-09-22 ENCOUNTER — Ambulatory Visit: Attending: Internal Medicine | Admitting: Physical Therapy

## 2023-09-22 ENCOUNTER — Other Ambulatory Visit: Payer: Self-pay

## 2023-09-22 DIAGNOSIS — R42 Dizziness and giddiness: Secondary | ICD-10-CM | POA: Diagnosis not present

## 2023-09-22 DIAGNOSIS — R2681 Unsteadiness on feet: Secondary | ICD-10-CM | POA: Insufficient documentation

## 2023-09-29 ENCOUNTER — Encounter: Payer: Self-pay | Admitting: Physical Therapy

## 2023-09-29 ENCOUNTER — Ambulatory Visit: Admitting: Physical Therapy

## 2023-09-29 ENCOUNTER — Other Ambulatory Visit: Payer: Self-pay

## 2023-09-29 DIAGNOSIS — R42 Dizziness and giddiness: Secondary | ICD-10-CM

## 2023-09-29 DIAGNOSIS — R2681 Unsteadiness on feet: Secondary | ICD-10-CM

## 2023-09-29 MED ORDER — MECLIZINE HCL 12.5 MG PO TABS: 12.5000 mg | ORAL_TABLET | Freq: Three times a day (TID) | ORAL | 1 refills | Status: AC | PRN

## 2023-09-29 NOTE — Therapy (Signed)
 OUTPATIENT PHYSICAL THERAPY VESTIBULAR TREATMENT     Patient Name: Wayne Silva MRN: [id number] DOB:1960/09/11, 63 y.o., male Today's Date: 09/29/2023  END OF SESSION:  PT End of Session - 09/29/23 1104     Visit Number 2    Number of Visits 5    Date for PT Re-Evaluation 10/20/23    Authorization Type BCBS    PT Start Time 1106    PT Stop Time 1150    PT Time Calculation (min) 44 min    Activity Tolerance Patient tolerated treatment well    Behavior During Therapy WFL for tasks assessed/performed              Past Medical History:  Diagnosis Date   Alcoholic (HCC)    Allergy     Anxiety    Asthma    Depression    History of kidney stones    Spastic bladder    Past Surgical History:  Procedure Laterality Date   HEMORRHOID SURGERY     HERNIA REPAIR  06/01/2023   KIDNEY STONE SURGERY     NASAL SEPTUM SURGERY     Patient Active Problem List   Diagnosis Date Noted   Mild intermittent asthma without complication 09/03/2023   Vertigo 08/31/2023   Facial numbness 08/31/2023   Recurrent left inguinal hernia 08/31/2023   At increased risk for exposure to communicable disease 08/31/2023   Bradycardia on ECG 03/03/2023   Direct left inguinal hernia 03/03/2023   Allergic rhinitis 03/03/2022   Chronic idiopathic constipation 04/30/2021   Encounter for general adult medical examination with abnormal findings 10/31/2019   Depression with somatization 10/27/2019   Seasonal allergic rhinitis due to pollen 06/21/2018   Moderate persistent asthma without complication 06/21/2018   Gastroesophageal reflux disease without esophagitis 06/21/2018   MCI (mild cognitive impairment) 05/01/2014   Cough variant asthma 05/01/2014   IC (interstitial cystitis) 08/26/2013   GAD (generalized anxiety disorder) 03/02/2013    PCP: Arcadio Knuckles, MD REFERRING PROVIDER: Arcadio Knuckles, MD   REFERRING DIAG:   THERAPY DIAG:  Dizziness and giddiness  Unsteadiness on feet  ONSET  DATE: 1 month ago  Rationale for Evaluation and Treatment: Rehabilitation  SUBJECTIVE:   SUBJECTIVE STATEMENT: Not necessarily getting dizziness like room-spinning, but it is more off balance.  Reports 5-6/10 symptoms of imbalance today.  Pt accompanied by: self  PERTINENT HISTORY: Alcohol abuse, anxiety, asthma, depression  PAIN:  Are you having pain? No  PRECAUTIONS: None  RED FLAGS: None   WEIGHT BEARING RESTRICTIONS: No  FALLS: Has patient fallen in last 6 months? No  LIVING ENVIRONMENT: Lives with: lives alone Lives in: Other condo Stairs: steps to enter with rail Has following equipment at home: None  PLOF: Independent, Vocation/Vocational requirements: working full time requiring computer work, and Leisure: likes fishing, dancing, running  PATIENT GOALS: improve dizziness   OBJECTIVE:  Reports his dizziness is a forward/back bobbing sensation TODAY'S TREATMENT: 09/29/2023 Activity Comments  Reviewed Brandt-Daroff-resolves after 60 sec No spinning upon lying down1st rep, then mild unsteadiness 2nd reps Mild dizziness upon sitting Rates 3-4 up to sit from R side; 1st rep, then 3/10 2nd rate Rates 3-4 up to sit from L side  R DH Negative nystagmus 1/10 dizziness symptoms  L DH Negative, mild dizziness 2/10 sensation; upon sitting 3-4 bobbing sensation  R roll 1/10 unsteadiness symptoms  L roll 2-3/10 unsteadiness symptoms  Vitals: seated 122/77, HR 55 bpm Standing 116/78, HR 61  Feels sway upon standing  M-CTSIB  Condition 1: Firm Surface, EO 30 Sec, Normal Sway reports 5/10 a/p sway  Condition 2: Firm Surface, EC 30 Sec, Mild Sway Reports 6-7/10 sway  Condition 3: Foam Surface, EO 30 Sec, Mild Sway Reports 5/10  Condition 4: Foam Surface, EC 30 Sec, Moderate Sway Reports 6-7/10 sway   PATIENT EDUCATION: Education details: Review of HEP, instructions for habituation Brandt-Daroff-symptom provokation and expected response from habituation; discussed  decreased vestibular system use on mCTSIB Person educated: Patient Education method: Explanation, Demonstration, and Verbal cues Education comprehension: verbalized understanding, returned demonstration, and verbal cues required  ------------------------------------------------- Note: Objective measures were completed at Evaluation unless otherwise noted.  DIAGNOSTIC FINDINGS: 09/03/23 brain MRI: Unremarkable non-contrast MRI appearance of the brain. No evidence of an intracranial mass or an acute intracranial abnormality. 2. Trace fluid within right mastoid air cells.   COGNITION: Overall cognitive status: Within functional limits for tasks assessed   SENSATION: Reports N/T in feet, lower legs- reports this is something he has noticed more since dizziness started  POSTURE:  rounded shoulders  GAIT: Gait pattern: WFL Assistive device utilized: None Level of assistance: Complete Independence   PATIENT SURVEYS:  DHI 12/100  VESTIBULAR ASSESSMENT:   GENERAL OBSERVATION: pt wears progressive lenses    OCULOMOTOR EXAM: Ocular Alignment: Cover/Uncover Test: WNL Cover/Cross Cover Test: WNL   Ocular ROM: No Limitations   Spontaneous Nystagmus: absent   Gaze-Induced Nystagmus: absent   Smooth Pursuits: intact   Saccades: intact   Convergence/Divergence: 4 cm    VESTIBULAR - OCULAR REFLEX:    Slow VOR: Normal; c/o mild dizziness with horizontal and vertical directions    VOR Cancellation: Normal   Head-Impulse Test: HIT Right: negative HIT Left: negative      POSITIONAL TESTING:  Right Roll Test: negative Left Roll Test: negative   Right Loaded Dix-Hallpike: negative; c/o mild dizziness upon lying down and sitting up  Left Loaded Dix-Hallpike: negative; c/o mild dizziness upon sitting up                                                                                                                               TREATMENT DATE: 09/22/23   PATIENT  EDUCATION: Education details: exam findings, prognosis, POC, HEP Person educated: Patient Education method: Explanation, Demonstration, Tactile cues, Verbal cues, and Handouts Education comprehension: verbalized understanding and returned demonstration  HOME EXERCISE PROGRAM: Access Code: [email or web address] URL: [email or web address] Date: 09/22/2023 Prepared by: [facility] - Outpatient  Rehab - [facility]  Exercises - Brandt-Daroff Vestibular Exercise  - 1 x daily - 5 x weekly - 2 sets - 3-5 reps   GOALS: Goals reviewed with patient? Yes  SHORT TERM GOALS: Target date: 10/06/2023  Patient to be independent with initial HEP. Baseline: HEP initiated Goal status: IN PROGRESS    LONG TERM GOALS: Target date: 10/20/2023  Patient to be independent with advanced HEP. Baseline: Not yet initiated  Goal status: IN PROGRESS  Patient will report  0/10 dizziness with bed mobility.  Baseline: Symptomatic  Goal status: IN PROGRESS  Patient to demonstrate mild-moderate sway with M-CTSIB condition with eyes closed/foam surface in order to improve safety in environments with uneven surfaces and dim lighting. Baseline: NT Goal status: IN PROGRESS  Patient to score at least 23/30 on FGA in order to decrease risk of falls. Baseline: NT Goal status: IN PROGRESS  Patient to score at least 18 points less on DHI in order to meet MCID and improve functional outcomes.  Baseline: 12 Goal status: IN PROGRESS   ASSESSMENT:  CLINICAL IMPRESSION: Pt presents today with continued reports of dizziness/unsteadiness.  He reports no head spinning/room spinning sensation, but more sensation of ant/posterior bobbing sensation upon sitting up from lying down and upon standing.  Initial sit/stand BP measures do not indicate significant BP dropping.  MCTSIB testing reveals mild sway on Condition 2 and mod sway on Condition 4; with vision removed in these conditions, he notes increased in symptoms of  sway to 6-7/10.  MCTSIB testing reveals decreased vestibular system input, but also seems increased sensation of sway compared to actual noted sway.  Skilled PT session also focused on review of Brandt-Daroff, with education provided for correct technique for habituation of symptoms. Pt will continue to benefit from skilled PT towards goals for improved functional mobility, decreased sensation of dizziness.   OBJECTIVE IMPAIRMENTS: decreased activity tolerance and dizziness.   ACTIVITY LIMITATIONS: bending, standing, and bed mobility  PARTICIPATION LIMITATIONS: cleaning, laundry, community activity, and yard work  PERSONAL FACTORS: Age and 3+ comorbidities: Alcohol abuse, anxiety, asthma, depression are also affecting patient's functional outcome.   REHAB POTENTIAL: Good  CLINICAL DECISION MAKING: Evolving/moderate complexity  EVALUATION COMPLEXITY: Moderate   PLAN:  PT FREQUENCY: 1x/week  PT DURATION: 4 weeks  PLANNED INTERVENTIONS: 97164- PT Re-evaluation, 97750- Physical Performance Testing, 97110-Therapeutic exercises, 97530- Therapeutic activity, 97112- Neuromuscular re-education, 97535- Self Care, 40102- Manual therapy, [phone]- Gait training, [phone]- Canalith repositioning, Patient/Family education, Balance training, Stair training, Taping, Dry Needling, Vestibular training, Cryotherapy, and Moist heat  PLAN FOR NEXT SESSION: Complete FGA; corner balance exercise and progress habituation; check true orthostatic measures    Dessie Flow, PT 09/29/23 11:54 AM Phone: [phone] Fax: [phone]   [facility] at [facility] [address] [place], [place] [address] Phone # [phone] Fax # [phone]

## 2023-10-15 ENCOUNTER — Other Ambulatory Visit: Payer: Self-pay | Admitting: Internal Medicine

## 2023-10-15 DIAGNOSIS — F411 Generalized anxiety disorder: Secondary | ICD-10-CM

## 2023-10-27 ENCOUNTER — Ambulatory Visit: Attending: Internal Medicine

## 2023-10-27 DIAGNOSIS — R2681 Unsteadiness on feet: Secondary | ICD-10-CM | POA: Diagnosis not present

## 2023-10-27 DIAGNOSIS — R42 Dizziness and giddiness: Secondary | ICD-10-CM | POA: Diagnosis not present

## 2023-10-27 NOTE — Therapy (Signed)
 OUTPATIENT PHYSICAL THERAPY VESTIBULAR TREATMENT and Recertification     Patient Name: Wayne Silva MRN: [id number] DOB:[birth year], 63 y.o., male Today's Date: 10/27/2023  END OF SESSION:  PT End of Session - 10/27/23 1105     Visit Number 3    Number of Visits 8    Date for PT Re-Evaluation 12/22/23    Authorization Type BCBS    PT Start Time 1105    PT Stop Time 1145    PT Time Calculation (min) 40 min    Activity Tolerance Patient tolerated treatment well    Behavior During Therapy WFL for tasks assessed/performed           Past Medical History:  Diagnosis Date   Alcoholic (HCC)    Allergy     Anxiety    Asthma    Depression    History of kidney stones    Spastic bladder    Past Surgical History:  Procedure Laterality Date   HEMORRHOID SURGERY     HERNIA REPAIR  06/01/2023   KIDNEY STONE SURGERY     NASAL SEPTUM SURGERY     Patient Active Problem List   Diagnosis Date Noted   Mild intermittent asthma without complication 09/03/2023   Vertigo 08/31/2023   Facial numbness 08/31/2023   Recurrent left inguinal hernia 08/31/2023   At increased risk for exposure to communicable disease 08/31/2023   Bradycardia on ECG 03/03/2023   Direct left inguinal hernia 03/03/2023   Allergic rhinitis 03/03/2022   Chronic idiopathic constipation 04/30/2021   Encounter for general adult medical examination with abnormal findings 10/31/2019   Depression with somatization 10/27/2019   Seasonal allergic rhinitis due to pollen 06/21/2018   Moderate persistent asthma without complication 06/21/2018   Gastroesophageal reflux disease without esophagitis 06/21/2018   MCI (mild cognitive impairment) 05/01/2014   Cough variant asthma 05/01/2014   IC (interstitial cystitis) 08/26/2013   GAD (generalized anxiety disorder) 03/02/2013    PCP: Arcadio Knuckles, MD REFERRING PROVIDER: Arcadio Knuckles, MD   REFERRING DIAG:   THERAPY DIAG:  Dizziness and giddiness  Unsteadiness on  feet  ONSET DATE: 1 month ago  Rationale for Evaluation and Treatment: Rehabilitation  SUBJECTIVE:   SUBJECTIVE STATEMENT: Still experiencing dizziness when arising from laying down or sit to stand.  Lightheaded with dizziness/imbalance with positional changes.   Pt accompanied by: self  PERTINENT HISTORY: Alcohol abuse, anxiety, asthma, depression  PAIN:  Are you having pain? No  PRECAUTIONS: None  RED FLAGS: None   WEIGHT BEARING RESTRICTIONS: No  FALLS: Has patient fallen in last 6 months? No  LIVING ENVIRONMENT: Lives with: lives alone Lives in: Other condo Stairs: steps to enter with rail Has following equipment at home: None  PLOF: Independent, Vocation/Vocational requirements: working full time requiring computer work, and Leisure: likes fishing, dancing, running  PATIENT GOALS: improve dizziness   OBJECTIVE:    TODAY'S TREATMENT: 10/27/23 Activity Comments  orthostatics Supine x 5 min: 104/69 mmHg, 62 bpm Standing x 1 min: 105/71, 84 bpm Standing x 3 min: 103/74, 77 bpm  FGA 26/30  M-CTSIB Condition 1: normal Condition 2: mild Condition 3: mild Condition 4: mild-moderate  Brandt-Daroff   Seated VOR x 1 Horizontal: 2-3/10 Vertical: 3/10 -symptoms        [facility] PT Assessment - 10/27/23 0001       Functional Gait  Assessment   Gait assessed  Yes    Gait Level Surface Walks 20 ft in less than 5.5 sec, no assistive devices, good  speed, no evidence for imbalance, normal gait pattern, deviates no more than 6 in outside of the 12 in walkway width.    Change in Gait Speed Able to smoothly change walking speed without loss of balance or gait deviation. Deviate no more than 6 in outside of the 12 in walkway width.    Gait with Horizontal Head Turns Performs head turns smoothly with slight change in gait velocity (eg, minor disruption to smooth gait path), deviates 6-10 in outside 12 in walkway width, or uses an assistive device.    Gait with Vertical Head  Turns Performs head turns with no change in gait. Deviates no more than 6 in outside 12 in walkway width.    Gait and Pivot Turn Pivot turns safely within 3 sec and stops quickly with no loss of balance.    Step Over Obstacle Is able to step over 2 stacked shoe boxes taped together (9 in total height) without changing gait speed. No evidence of imbalance.    Gait with Narrow Base of Support Ambulates 7-9 steps.    Gait with Eyes Closed Walks 20 ft, slow speed, abnormal gait pattern, evidence for imbalance, deviates 10-15 in outside 12 in walkway width. Requires more than 9 sec to ambulate 20 ft.    Ambulating Backwards Walks 20 ft, no assistive devices, good speed, no evidence for imbalance, normal gait    Steps Alternating feet, no rail.    Total Score 26          Reports his dizziness is a forward/back bobbing sensation TODAY'S TREATMENT: 09/29/2023 Activity Comments  Reviewed Brandt-Daroff-resolves after 60 sec No spinning upon lying down1st rep, then mild unsteadiness 2nd reps Mild dizziness upon sitting Rates 3-4 up to sit from R side; 1st rep, then 3/10 2nd rate Rates 3-4 up to sit from L side  R DH Negative nystagmus 1/10 dizziness symptoms  L DH Negative, mild dizziness 2/10 sensation; upon sitting 3-4 bobbing sensation  R roll 1/10 unsteadiness symptoms  L roll 2-3/10 unsteadiness symptoms  Vitals: seated 122/77, HR 55 bpm Standing 116/78, HR 61  Feels sway upon standing    M-CTSIB  Condition 1: Firm Surface, EO 30 Sec, Normal Sway reports 5/10 a/p sway  Condition 2: Firm Surface, EC 30 Sec, Mild Sway Reports 6-7/10 sway  Condition 3: Foam Surface, EO 30 Sec, Mild Sway Reports 5/10  Condition 4: Foam Surface, EC 30 Sec, Moderate Sway Reports 6-7/10 sway   PATIENT EDUCATION: Education details: Review of HEP, instructions for habituation Brandt-Daroff-symptom provokation and expected response from habituation; discussed decreased vestibular system use on mCTSIB Person  educated: Patient Education method: Explanation, Demonstration, and Verbal cues Education comprehension: verbalized understanding, returned demonstration, and verbal cues required  HOME EXERCISE PROGRAM: Access Code: [email or web address] URL: [email or web address] Date: 09/22/2023 Prepared by: [facility] - Outpatient  Rehab - [facility]  Exercises - Brandt-Daroff Vestibular Exercise  - 1 x daily - 5 x weekly - 2 sets - 3-5 reps - Seated Gaze Stabilization with Head Rotation  - 1 x daily - 7 x weekly - 3-5 sets - 15-30 sec hold - Seated Gaze Stabilization with Head Nod  - 1 x daily - 7 x weekly - 3-5 sets - 15-30 sec hold ------------------------------------------------- Note: Objective measures were completed at Evaluation unless otherwise noted.  DIAGNOSTIC FINDINGS: 09/03/23 brain MRI: Unremarkable non-contrast MRI appearance of the brain. No evidence of an intracranial mass or an acute intracranial abnormality. 2. Trace fluid within right mastoid air cells.  COGNITION: Overall cognitive status: Within functional limits for tasks assessed   SENSATION: Reports N/T in feet, lower legs- reports this is something he has noticed more since dizziness started  POSTURE:  rounded shoulders  GAIT: Gait pattern: WFL Assistive device utilized: None Level of assistance: Complete Independence   PATIENT SURVEYS:  DHI 12/100  VESTIBULAR ASSESSMENT:   GENERAL OBSERVATION: pt wears progressive lenses    OCULOMOTOR EXAM: Ocular Alignment: Cover/Uncover Test: WNL Cover/Cross Cover Test: WNL   Ocular ROM: No Limitations   Spontaneous Nystagmus: absent   Gaze-Induced Nystagmus: absent   Smooth Pursuits: intact   Saccades: intact   Convergence/Divergence: 4 cm    VESTIBULAR - OCULAR REFLEX:    Slow VOR: Normal; c/o mild dizziness with horizontal and vertical directions    VOR Cancellation: Normal   Head-Impulse Test: HIT Right: negative HIT Left:  negative      POSITIONAL TESTING:  Right Roll Test: negative Left Roll Test: negative   Right Loaded Dix-Hallpike: negative; c/o mild dizziness upon lying down and sitting up  Left Loaded Dix-Hallpike: negative; c/o mild dizziness upon sitting up                                                                                                                                  GOALS: Goals reviewed with patient? Yes  SHORT TERM GOALS: Target date: 10/06/2023  Patient to be independent with initial HEP. Baseline: HEP initiated Goal status: MET    LONG TERM GOALS: Target date: 12/22/23  Patient to be independent with advanced HEP. Baseline: Not yet initiated  Goal status: IN PROGRESS  Patient will report 0/10 dizziness with bed mobility.  Baseline: Symptomatic  Goal status: IN PROGRESS  Patient to demonstrate mild-moderate sway with M-CTSIB condition with eyes closed/foam surface in order to improve safety in environments with uneven surfaces and dim lighting. Baseline: NT Goal status: IN PROGRESS  Patient to score at least 23/30 on FGA in order to decrease risk of falls. Baseline: 26/30 Goal status: MET  Patient to score at least 18 points less on DHI in order to meet MCID and improve functional outcomes.  Baseline: 12 Goal status: IN PROGRESS   ASSESSMENT:  CLINICAL IMPRESSION: Returns to clinic after 4 weeks and notes ongoing instances of dizziness/lightheaded/imbalance. Notes these symptoms mostly provoked by large change of position such as supine<>sit and with sit to stand after sitting for prolonged periods. Orthostatics measured and unrevealing for change. Functional Gait Assessment with good performance with most notable deviation being walking with horizontal head turns and eyes closed conditions. Symptomatic to VOR x 1. Provided HEP updates to inclue gaze stabilization for further habituation. Continued sessions to progress current HEP and provide for relevant  progressions for self-performance.   OBJECTIVE IMPAIRMENTS: decreased activity tolerance and dizziness.   ACTIVITY LIMITATIONS: bending, standing, and bed mobility  PARTICIPATION LIMITATIONS: cleaning, laundry, community activity, and yard work  PERSONAL FACTORS: Age and 3+  comorbidities: Alcohol abuse, anxiety, asthma, depression are also affecting patient's functional outcome.   REHAB POTENTIAL: Good  CLINICAL DECISION MAKING: Evolving/moderate complexity  EVALUATION COMPLEXITY: Moderate   PLAN:  PT FREQUENCY: 1x/week  PT DURATION: 4 weeks  PLANNED INTERVENTIONS: 97164- PT Re-evaluation, 97750- Physical Performance Testing, 97110-Therapeutic exercises, 97530- Therapeutic activity, [id number]- Neuromuscular re-education, 97535- Self Care, 16109- Manual therapy, [id number]- Gait training, [phone]- Canalith repositioning, Patient/Family education, Balance training, Stair training, Taping, Dry Needling, Vestibular training, Cryotherapy, and Moist heat  PLAN FOR NEXT SESSION: corner balance exercise and progress habituation, e.g. walking VOR   12:57 PM, 10/27/23 M. Kelly Sally Reimers, PT, DPT Physical Therapist- [place] Office Number: [phone]

## 2023-10-31 ENCOUNTER — Other Ambulatory Visit: Payer: Self-pay | Admitting: Internal Medicine

## 2023-11-03 ENCOUNTER — Encounter: Payer: Self-pay | Admitting: Physical Therapy

## 2023-11-03 ENCOUNTER — Ambulatory Visit: Admitting: Physical Therapy

## 2023-11-03 DIAGNOSIS — R2681 Unsteadiness on feet: Secondary | ICD-10-CM | POA: Diagnosis not present

## 2023-11-03 DIAGNOSIS — R42 Dizziness and giddiness: Secondary | ICD-10-CM

## 2023-11-03 NOTE — Therapy (Incomplete)
 OUTPATIENT PHYSICAL THERAPY VESTIBULAR TREATMENT      Patient Name: Wayne Silva MRN: [id number] DOB:June 10, 1960, 63 y.o., male Today's Date: 11/04/2023  END OF SESSION:  PT End of Session - 11/03/23 1106     Visit Number 4    Number of Visits 8    Date for PT Re-Evaluation 12/22/23    Authorization Type BCBS    PT Start Time 1106    PT Stop Time 1145    PT Time Calculation (min) 39 min    Activity Tolerance Patient tolerated treatment well    Behavior During Therapy WFL for tasks assessed/performed            Past Medical History:  Diagnosis Date   Alcoholic (HCC)    Allergy     Anxiety    Asthma    Depression    History of kidney stones    Spastic bladder    Past Surgical History:  Procedure Laterality Date   HEMORRHOID SURGERY     HERNIA REPAIR  06/01/2023   KIDNEY STONE SURGERY     NASAL SEPTUM SURGERY     Patient Active Problem List   Diagnosis Date Noted   Mild intermittent asthma without complication 09/03/2023   Vertigo 08/31/2023   Facial numbness 08/31/2023   Recurrent left inguinal hernia 08/31/2023   At increased risk for exposure to communicable disease 08/31/2023   Bradycardia on ECG 03/03/2023   Direct left inguinal hernia 03/03/2023   Allergic rhinitis 03/03/2022   Chronic idiopathic constipation 04/30/2021   Encounter for general adult medical examination with abnormal findings 10/31/2019   Depression with somatization 10/27/2019   Seasonal allergic rhinitis due to pollen 06/21/2018   Moderate persistent asthma without complication 06/21/2018   Gastroesophageal reflux disease without esophagitis 06/21/2018   MCI (mild cognitive impairment) 05/01/2014   Cough variant asthma 05/01/2014   IC (interstitial cystitis) 08/26/2013   GAD (generalized anxiety disorder) 03/02/2013    PCP: Joshua Debby CROME, MD REFERRING PROVIDER: Joshua Debby CROME, MD   REFERRING DIAG:   THERAPY DIAG:  Dizziness and giddiness  Unsteadiness on feet  ONSET  DATE: 1 month ago  Rationale for Evaluation and Treatment: Rehabilitation  SUBJECTIVE:   SUBJECTIVE STATEMENT: Feeling okay.  When I get up on occasion, I get off balance and dizzy.  The one thing I noticed, is that when it started, the whole room seemed to be spinning, and now it is more of an unsteady feeling.  I would like to take more time than a week between appointments to try to work on this at home.  Pt accompanied by: self  PERTINENT HISTORY: Alcohol abuse, anxiety, asthma, depression  PAIN:  Are you having pain? No  PRECAUTIONS: None  RED FLAGS: None   WEIGHT BEARING RESTRICTIONS: No  FALLS: Has patient fallen in last 6 months? No  LIVING ENVIRONMENT: Lives with: lives alone Lives in: Other condo Stairs: steps to enter with rail Has following equipment at home: None  PLOF: Independent, Vocation/Vocational requirements: working full time requiring computer work, and Leisure: likes fishing, dancing, running  PATIENT GOALS: improve dizziness   OBJECTIVE:    TODAY'S TREATMENT: 11/03/2023 Activity Comments  VOR Review horizontal and vertical Good return demo-reports 4-5/10 symptoms  Standing VOR x 1, horizontal 15 seconds Standign VOR x 1 vertical, 15 sec 4-5/10 imbalance symptoms  4-5/10 imbalance symptoms   Corner foam:  Feet apart EO + head turns Feet apart EC + head turns/nods Saccadic eye movements, 4-5/10   Increased  EC forward walking at counter, with UE support 6/10 symptoms off balance  VOR with gait, horizontal and vertical 6/10 symptoms off balance       Symptoms return to (baseline) low level at end of session  HOME EXERCISE PROGRAM: Access Code: [email or web address] URL: [email or web address] Date: 11/03/2023 Prepared by: [facility] - Outpatient  Rehab - [facility]  Exercises - Brandt-Daroff Vestibular Exercise  - 1 x daily - 5 x weekly - 2 sets - 3-5 reps - Seated Gaze Stabilization with Head Rotation  - 1 x daily - 7 x  weekly - 3-5 sets - 15-30 sec hold - Seated Gaze Stabilization with Head Nod  - 1 x daily - 7 x weekly - 3-5 sets - 15-30 sec hold - Standing Balance with Eyes Closed on Foam  - 1 x daily - 7 x weekly - 3 sets - 5-10 reps - Walking with Eyes Closed and Counter Support  - 1 x daily - 7 x weekly - 1 sets - 3-5 reps  PATIENT EDUCATION: Education details: HEP additions, reiterated education on habituation of symptoms between exercises and appropriate symptom level for exercises Person educated: Patient Education method: Explanation, Demonstration, and Verbal cues Education comprehension: verbalized understanding, returned demonstration, and verbal cues required  TREATMENT: 10/27/23 Activity Comments  orthostatics Supine x 5 min: 104/69 mmHg, 62 bpm Standing x 1 min: 105/71, 84 bpm Standing x 3 min: 103/74, 77 bpm  FGA 26/30  M-CTSIB Condition 1: normal Condition 2: mild Condition 3: mild Condition 4: mild-moderate  Brandt-Daroff   Seated VOR x 1 Horizontal: 2-3/10 Vertical: 3/10 -symptoms          Reports his dizziness is a forward/back bobbing sensation TREATMENT: 09/29/2023 Activity Comments  Reviewed Brandt-Daroff-resolves after 60 sec No spinning upon lying down1st rep, then mild unsteadiness 2nd reps Mild dizziness upon sitting Rates 3-4 up to sit from R side; 1st rep, then 3/10 2nd rate Rates 3-4 up to sit from L side  R DH Negative nystagmus 1/10 dizziness symptoms  L DH Negative, mild dizziness 2/10 sensation; upon sitting 3-4 bobbing sensation  R roll 1/10 unsteadiness symptoms  L roll 2-3/10 unsteadiness symptoms  Vitals: seated 122/77, HR 55 bpm Standing 116/78, HR 61  Feels sway upon standing    M-CTSIB  Condition 1: Firm Surface, EO 30 Sec, Normal Sway reports 5/10 a/p sway  Condition 2: Firm Surface, EC 30 Sec, Mild Sway Reports 6-7/10 sway  Condition 3: Foam Surface, EO 30 Sec, Mild Sway Reports 5/10  Condition 4: Foam Surface, EC 30 Sec, Moderate Sway Reports  6-7/10 sway     ------------------------------------------------- Note: Objective measures were completed at Evaluation unless otherwise noted.  DIAGNOSTIC FINDINGS: 09/03/23 brain MRI: Unremarkable non-contrast MRI appearance of the brain. No evidence of an intracranial mass or an acute intracranial abnormality. 2. Trace fluid within right mastoid air cells.   COGNITION: Overall cognitive status: Within functional limits for tasks assessed   SENSATION: Reports N/T in feet, lower legs- reports this is something he has noticed more since dizziness started  POSTURE:  rounded shoulders  GAIT: Gait pattern: WFL Assistive device utilized: None Level of assistance: Complete Independence   PATIENT SURVEYS:  DHI 12/100  VESTIBULAR ASSESSMENT:   GENERAL OBSERVATION: pt wears progressive lenses    OCULOMOTOR EXAM: Ocular Alignment: Cover/Uncover Test: WNL Cover/Cross Cover Test: WNL   Ocular ROM: No Limitations   Spontaneous Nystagmus: absent   Gaze-Induced Nystagmus: absent   Smooth Pursuits: intact   Saccades: intact  Convergence/Divergence: 4 cm    VESTIBULAR - OCULAR REFLEX:    Slow VOR: Normal; c/o mild dizziness with horizontal and vertical directions    VOR Cancellation: Normal   Head-Impulse Test: HIT Right: negative HIT Left: negative      POSITIONAL TESTING:  Right Roll Test: negative Left Roll Test: negative   Right Loaded Dix-Hallpike: negative; c/o mild dizziness upon lying down and sitting up  Left Loaded Dix-Hallpike: negative; c/o mild dizziness upon sitting up                                                                                                                                  GOALS: Goals reviewed with patient? Yes  SHORT TERM GOALS: Target date: 10/06/2023  Patient to be independent with initial HEP. Baseline: HEP initiated Goal status: MET    LONG TERM GOALS: Target date: 12/22/23  Patient to be independent with advanced  HEP. Baseline: Not yet initiated  Goal status: IN PROGRESS  Patient will report 0/10 dizziness with bed mobility.  Baseline: Symptomatic  Goal status: IN PROGRESS  Patient to demonstrate mild-moderate sway with M-CTSIB condition with eyes closed/foam surface in order to improve safety in environments with uneven surfaces and dim lighting. Baseline: NT Goal status: IN PROGRESS  Patient to score at least 23/30 on FGA in order to decrease risk of falls. Baseline: 26/30 Goal status: MET  Patient to score at least 18 points less on DHI in order to meet MCID and improve functional outcomes.  Baseline: 12 Goal status: IN PROGRESS   ASSESSMENT:  CLINICAL IMPRESSION: Pt presents today and reports that the spinning sensation of dizziness has gone away and now the sensation is mostly imbalance, which he states is low level at beginning of session. Skilled PT session focused on multi-sensory balance as well as VOR progression and all exercises bring on at least 4-5/10 imbalance symptoms, with EC and VOR with gait bringing on 6/10 symptoms.   With corner balance exercises EO and head turns, he demo saccadic eye movements, but reports no general increase in symptoms.  Educated on habituation with VOR and multi-sensory balance, and educated on appropriate symptom level while performing exercises at home.  He requests to have more time between appointments to work on exercises at home, so his next scheduled appointment is 11/17/2023.  He reports symptoms have returned to baseline upon end of session.  OBJECTIVE IMPAIRMENTS: decreased activity tolerance and dizziness.   ACTIVITY LIMITATIONS: bending, standing, and bed mobility  PARTICIPATION LIMITATIONS: cleaning, laundry, community activity, and yard work  PERSONAL FACTORS: Age and 3+ comorbidities: Alcohol abuse, anxiety, asthma, depression are also affecting patient's functional outcome.   REHAB POTENTIAL: Good  CLINICAL DECISION MAKING:  Evolving/moderate complexity  EVALUATION COMPLEXITY: Moderate   PLAN:  PT FREQUENCY: 1x/week  PT DURATION: 4 weeks  PLANNED INTERVENTIONS: 97164- PT Re-evaluation, 97750- Physical Performance Testing, 97110-Therapeutic exercises, 97530- Therapeutic activity, [id number]- Neuromuscular re-education, 97535- Self  Care, [id number]- Manual therapy, [phone]- Gait training, [phone]- Canalith repositioning, Patient/Family education, Balance training, Stair training, Taping, Dry Needling, Vestibular training, Cryotherapy, and Moist heat  PLAN FOR NEXT SESSION: Review HEP; continue to progress corner balance exercise and progress habituation, e.g. walking VOR   Greig Anon, PT 11/04/23 7:46 AM Phone: [phone] Fax: [phone]  [facility] at [facility] [address], [address] [place], [place] [address] Phone # [phone] Fax # [phone]

## 2023-11-11 DIAGNOSIS — K4020 Bilateral inguinal hernia, without obstruction or gangrene, not specified as recurrent: Secondary | ICD-10-CM | POA: Diagnosis not present

## 2023-11-12 DIAGNOSIS — J3489 Other specified disorders of nose and nasal sinuses: Secondary | ICD-10-CM | POA: Diagnosis not present

## 2023-11-17 ENCOUNTER — Ambulatory Visit: Attending: Internal Medicine

## 2023-11-17 DIAGNOSIS — R42 Dizziness and giddiness: Secondary | ICD-10-CM | POA: Insufficient documentation

## 2023-11-17 DIAGNOSIS — R2681 Unsteadiness on feet: Secondary | ICD-10-CM | POA: Insufficient documentation

## 2023-11-17 NOTE — Therapy (Signed)
 OUTPATIENT PHYSICAL THERAPY VESTIBULAR TREATMENT      Patient Name: Wayne Silva MRN: [id number] DOB:Aug 24, 1960, 63 y.o., male Today's Date: 11/17/2023  END OF SESSION:  PT End of Session - 11/17/23 1017     Visit Number 5    Number of Visits 8    Date for PT Re-Evaluation 12/22/23    Authorization Type BCBS    PT Start Time 1017    PT Stop Time 1100    PT Time Calculation (min) 43 min    Activity Tolerance Patient tolerated treatment well    Behavior During Therapy WFL for tasks assessed/performed            Past Medical History:  Diagnosis Date   Alcoholic (HCC)    Allergy     Anxiety    Asthma    Depression    History of kidney stones    Spastic bladder    Past Surgical History:  Procedure Laterality Date   HEMORRHOID SURGERY     HERNIA REPAIR  06/01/2023   KIDNEY STONE SURGERY     NASAL SEPTUM SURGERY     Patient Active Problem List   Diagnosis Date Noted   Mild intermittent asthma without complication 09/03/2023   Vertigo 08/31/2023   Facial numbness 08/31/2023   Recurrent left inguinal hernia 08/31/2023   At increased risk for exposure to communicable disease 08/31/2023   Bradycardia on ECG 03/03/2023   Direct left inguinal hernia 03/03/2023   Allergic rhinitis 03/03/2022   Chronic idiopathic constipation 04/30/2021   Encounter for general adult medical examination with abnormal findings 10/31/2019   Depression with somatization 10/27/2019   Seasonal allergic rhinitis due to pollen 06/21/2018   Moderate persistent asthma without complication 06/21/2018   Gastroesophageal reflux disease without esophagitis 06/21/2018   MCI (mild cognitive impairment) 05/01/2014   Cough variant asthma 05/01/2014   IC (interstitial cystitis) 08/26/2013   GAD (generalized anxiety disorder) 03/02/2013    PCP: Joshua Debby CROME, MD REFERRING PROVIDER: Joshua Debby CROME, MD   REFERRING DIAG:   THERAPY DIAG:  Dizziness and giddiness  Unsteadiness on feet  ONSET  DATE: 1 month ago  Rationale for Evaluation and Treatment: Rehabilitation  SUBJECTIVE:   SUBJECTIVE STATEMENT: Mostly feeling imbalanced. No obvious provocation with busy environments. Overall, improved since start of care.   Pt accompanied by: self  PERTINENT HISTORY: Alcohol abuse, anxiety, asthma, depression  PAIN:  Are you having pain? No  PRECAUTIONS: None  RED FLAGS: None   WEIGHT BEARING RESTRICTIONS: No  FALLS: Has patient fallen in last 6 months? No  LIVING ENVIRONMENT: Lives with: lives alone Lives in: Other condo Stairs: steps to enter with rail Has following equipment at home: None  PLOF: Independent, Vocation/Vocational requirements: working full time requiring computer work, and Leisure: likes fishing, dancing, running  PATIENT GOALS: improve dizziness   OBJECTIVE:   TODAY'S TREATMENT: 11/17/23 Activity Comments  Baseline symptoms 0/10   Walking VOR x 1  -horizontal 1-2/10 -vertical: 1-2/10 symptoms  Standing VOR x 2, 30 sec -horizontal,  -vertical, 1/10 symptoms  Fukuda step test No abnormality  Repeated twist turns to catch ball   Walking vertical ball toss x 60 sec No symptoms, slight residual feeling at end of drill  Multisensory balance -on airex pad: throwing, kicking activities, EO/EC static, EC w/ postural perturbations. EC w/ head turns. Increased difficulty with narrow BOS  Demo of jump squats with turn For example of more high level plyo and vestibular activity  Symptoms return to (baseline) low level at end of session  HOME EXERCISE PROGRAM: Access Code: [email or web address] URL: [email or web address] Date: 11/03/2023 Prepared by: [facility] - Outpatient  Rehab - [facility]  Exercises - Brandt-Daroff Vestibular Exercise  - 1 x daily - 5 x weekly - 2 sets - 3-5 reps - Seated Gaze Stabilization with Head Rotation  - 1 x daily - 7 x weekly - 3-5 sets - 15-30 sec hold - Seated Gaze Stabilization with Head Nod  - 1 x daily  - 7 x weekly - 3-5 sets - 15-30 sec hold - Standing Balance with Eyes Closed on Foam  - 1 x daily - 7 x weekly - 3 sets - 5-10 reps - Walking with Eyes Closed and Counter Support  - 1 x daily - 7 x weekly - 1 sets - 3-5 reps - Walking Gaze Stabilization Head Rotation  - 1 x daily - 7 x weekly - Romberg Stance on Foam Pad with Head Rotation  - 1 x daily - 7 x weekly - 3 sets - 10 reps - Romberg Stance with Head Nods on Foam Pad  - 1 x daily - 7 x weekly - 3 sets - 10 reps  PATIENT EDUCATION: Education details: HEP additions, reiterated education on habituation of symptoms between exercises and appropriate symptom level for exercises Person educated: Patient Education method: Explanation, Demonstration, and Verbal cues Education comprehension: verbalized understanding, returned demonstration, and verbal cues required       M-CTSIB  Condition 1: Firm Surface, EO 30 Sec, Normal Sway reports 5/10 a/p sway  Condition 2: Firm Surface, EC 30 Sec, Mild Sway Reports 6-7/10 sway  Condition 3: Foam Surface, EO 30 Sec, Mild Sway Reports 5/10  Condition 4: Foam Surface, EC 30 Sec, Moderate Sway Reports 6-7/10 sway     ------------------------------------------------- Note: Objective measures were completed at Evaluation unless otherwise noted.  DIAGNOSTIC FINDINGS: 09/03/23 brain MRI: Unremarkable non-contrast MRI appearance of the brain. No evidence of an intracranial mass or an acute intracranial abnormality. 2. Trace fluid within right mastoid air cells.   COGNITION: Overall cognitive status: Within functional limits for tasks assessed   SENSATION: Reports N/T in feet, lower legs- reports this is something he has noticed more since dizziness started  POSTURE:  rounded shoulders  GAIT: Gait pattern: WFL Assistive device utilized: None Level of assistance: Complete Independence   PATIENT SURVEYS:  DHI 12/100  VESTIBULAR ASSESSMENT:   GENERAL OBSERVATION: pt wears progressive  lenses    OCULOMOTOR EXAM: Ocular Alignment: Cover/Uncover Test: WNL Cover/Cross Cover Test: WNL   Ocular ROM: No Limitations   Spontaneous Nystagmus: absent   Gaze-Induced Nystagmus: absent   Smooth Pursuits: intact   Saccades: intact   Convergence/Divergence: 4 cm    VESTIBULAR - OCULAR REFLEX:    Slow VOR: Normal; c/o mild dizziness with horizontal and vertical directions    VOR Cancellation: Normal   Head-Impulse Test: HIT Right: negative HIT Left: negative      POSITIONAL TESTING:  Right Roll Test: negative Left Roll Test: negative   Right Loaded Dix-Hallpike: negative; c/o mild dizziness upon lying down and sitting up  Left Loaded Dix-Hallpike: negative; c/o mild dizziness upon sitting up  GOALS: Goals reviewed with patient? Yes  SHORT TERM GOALS: Target date: 10/06/2023  Patient to be independent with initial HEP. Baseline: HEP initiated Goal status: MET    LONG TERM GOALS: Target date: 12/22/23  Patient to be independent with advanced HEP. Baseline: Not yet initiated  Goal status: IN PROGRESS  Patient will report 0/10 dizziness with bed mobility.  Baseline: Symptomatic  Goal status: IN PROGRESS  Patient to demonstrate mild-moderate sway with M-CTSIB condition with eyes closed/foam surface in order to improve safety in environments with uneven surfaces and dim lighting. Baseline: NT Goal status: IN PROGRESS  Patient to score at least 23/30 on FGA in order to decrease risk of falls. Baseline: 26/30 Goal status: MET  Patient to score at least 18 points less on DHI in order to meet MCID and improve functional outcomes.  Baseline: 12 Goal status: IN PROGRESS   ASSESSMENT:  CLINICAL IMPRESSION: Pt reports overall improvement in symptoms with less intense and decreased time for episodes mostly reporting a feeling of  imbalanced/unsteady such as when arising from sitting after being sedentary.  Progressed current plan to include more demanding aspects for progression such as VOR x 2 and activities in session performing repeated faster motions of head rotation and flexion/extension with minimal provocation experienced.  Fukuda step test performed without significant deviation. Difficulty with Romberg on foam with eyes closed and head movement conditions with these added to HEP for progression.  Pt reports good compliance and confidence for progression at home and will check back in two weeks for assessment and progression if relevant  OBJECTIVE IMPAIRMENTS: decreased activity tolerance and dizziness.   ACTIVITY LIMITATIONS: bending, standing, and bed mobility  PARTICIPATION LIMITATIONS: cleaning, laundry, community activity, and yard work  PERSONAL FACTORS: Age and 3+ comorbidities: Alcohol abuse, anxiety, asthma, depression are also affecting patient's functional outcome.   REHAB POTENTIAL: Good  CLINICAL DECISION MAKING: Evolving/moderate complexity  EVALUATION COMPLEXITY: Moderate   PLAN:  PT FREQUENCY: 1x/week  PT DURATION: 4 weeks  PLANNED INTERVENTIONS: 97164- PT Re-evaluation, 97750- Physical Performance Testing, 97110-Therapeutic exercises, 97530- Therapeutic activity, [id number]- Neuromuscular re-education, 97535- Self Care, 02859- Manual therapy, [id number]- Gait training, [phone]- Canalith repositioning, Patient/Family education, Balance training, Stair training, Taping, Dry Needling, Vestibular training, Cryotherapy, and Moist heat  PLAN FOR NEXT SESSION: Review HEP; continue to progress corner balance exercise and progress habituation, e.g. walking VOR  12:50 PM, 11/17/23 M. Kelly Caria Transue, PT, DPT Physical Therapist- [place] Office Number: [phone]

## 2023-11-20 DIAGNOSIS — N138 Other obstructive and reflux uropathy: Secondary | ICD-10-CM | POA: Diagnosis not present

## 2023-11-20 DIAGNOSIS — N401 Enlarged prostate with lower urinary tract symptoms: Secondary | ICD-10-CM | POA: Diagnosis not present

## 2023-11-20 DIAGNOSIS — R310 Gross hematuria: Secondary | ICD-10-CM | POA: Diagnosis not present

## 2023-11-20 DIAGNOSIS — T387X5S Adverse effect of androgens and anabolic congeners, sequela: Secondary | ICD-10-CM | POA: Diagnosis not present

## 2023-11-20 DIAGNOSIS — E291 Testicular hypofunction: Secondary | ICD-10-CM | POA: Diagnosis not present

## 2023-11-20 DIAGNOSIS — R3589 Other polyuria: Secondary | ICD-10-CM | POA: Diagnosis not present

## 2023-11-29 ENCOUNTER — Other Ambulatory Visit: Payer: Self-pay | Admitting: Internal Medicine

## 2023-12-01 ENCOUNTER — Ambulatory Visit: Admitting: Physical Therapy

## 2024-02-26 DIAGNOSIS — N401 Enlarged prostate with lower urinary tract symptoms: Secondary | ICD-10-CM | POA: Diagnosis not present

## 2024-02-26 DIAGNOSIS — N138 Other obstructive and reflux uropathy: Secondary | ICD-10-CM | POA: Diagnosis not present

## 2024-03-04 ENCOUNTER — Encounter: Payer: Self-pay | Admitting: Internal Medicine

## 2024-03-09 ENCOUNTER — Other Ambulatory Visit: Payer: Self-pay

## 2024-03-09 DIAGNOSIS — F411 Generalized anxiety disorder: Secondary | ICD-10-CM

## 2024-03-10 ENCOUNTER — Other Ambulatory Visit: Payer: Self-pay | Admitting: Internal Medicine

## 2024-03-10 DIAGNOSIS — F411 Generalized anxiety disorder: Secondary | ICD-10-CM

## 2024-03-10 MED ORDER — MIRTAZAPINE 15 MG PO TABS
15.0000 mg | ORAL_TABLET | Freq: Every day | ORAL | 0 refills | Status: DC
Start: 2024-03-10 — End: 2024-04-05

## 2024-03-22 DIAGNOSIS — L820 Inflamed seborrheic keratosis: Secondary | ICD-10-CM | POA: Diagnosis not present

## 2024-03-22 DIAGNOSIS — L853 Xerosis cutis: Secondary | ICD-10-CM | POA: Diagnosis not present

## 2024-03-22 DIAGNOSIS — L821 Other seborrheic keratosis: Secondary | ICD-10-CM | POA: Diagnosis not present

## 2024-03-22 DIAGNOSIS — L700 Acne vulgaris: Secondary | ICD-10-CM | POA: Diagnosis not present

## 2024-03-22 DIAGNOSIS — D1801 Hemangioma of skin and subcutaneous tissue: Secondary | ICD-10-CM | POA: Diagnosis not present

## 2024-04-04 ENCOUNTER — Other Ambulatory Visit: Payer: Self-pay | Admitting: Internal Medicine

## 2024-04-04 DIAGNOSIS — F411 Generalized anxiety disorder: Secondary | ICD-10-CM

## 2024-04-05 ENCOUNTER — Ambulatory Visit (INDEPENDENT_AMBULATORY_CARE_PROVIDER_SITE_OTHER): Admitting: Internal Medicine

## 2024-04-05 ENCOUNTER — Encounter: Payer: Self-pay | Admitting: Internal Medicine

## 2024-04-05 VITALS — BP 112/78 | HR 67 | Temp 97.9°F | Resp 16 | Ht 67.0 in | Wt 138.6 lb

## 2024-04-05 DIAGNOSIS — Z0000 Encounter for general adult medical examination without abnormal findings: Secondary | ICD-10-CM

## 2024-04-05 DIAGNOSIS — F411 Generalized anxiety disorder: Secondary | ICD-10-CM

## 2024-04-05 DIAGNOSIS — E785 Hyperlipidemia, unspecified: Secondary | ICD-10-CM | POA: Diagnosis not present

## 2024-04-05 DIAGNOSIS — Z23 Encounter for immunization: Secondary | ICD-10-CM | POA: Diagnosis not present

## 2024-04-05 DIAGNOSIS — G3184 Mild cognitive impairment, so stated: Secondary | ICD-10-CM | POA: Diagnosis not present

## 2024-04-05 DIAGNOSIS — R001 Bradycardia, unspecified: Secondary | ICD-10-CM

## 2024-04-05 DIAGNOSIS — Z0001 Encounter for general adult medical examination with abnormal findings: Secondary | ICD-10-CM

## 2024-04-05 LAB — LIPID PANEL
Cholesterol: 213 mg/dL — ABNORMAL HIGH (ref 0–200)
HDL: 68.8 mg/dL (ref >39.00)
LDL Cholesterol: 130 mg/dL — ABNORMAL HIGH (ref 0–99)
NonHDL: 143.78
Total CHOL/HDL Ratio: 3
Triglycerides: 68 mg/dL (ref 0.0–149.0)
VLDL: 13.6 mg/dL (ref 0.0–40.0)

## 2024-04-05 LAB — HEPATIC FUNCTION PANEL
ALT: 11 U/L (ref 0–53)
AST: 19 U/L (ref 0–37)
Albumin: 4.8 g/dL (ref 3.5–5.2)
Alkaline Phosphatase: 53 U/L (ref 39–117)
Bilirubin, Direct: 0.1 mg/dL (ref 0.0–0.3)
Total Bilirubin: 0.8 mg/dL (ref 0.2–1.2)
Total Protein: 7.3 g/dL (ref 6.0–8.3)

## 2024-04-05 LAB — PSA: PSA: 1.94 ng/mL (ref 0.10–4.00)

## 2024-04-05 LAB — TSH: TSH: 2.48 u[IU]/mL (ref 0.35–5.50)

## 2024-04-05 MED ORDER — MIRTAZAPINE 15 MG PO TABS: 15.0000 mg | ORAL_TABLET | Freq: Every day | ORAL | 1 refills | Status: AC

## 2024-04-05 NOTE — Patient Instructions (Signed)
 Health Maintenance, Male  Adopting a healthy lifestyle and getting preventive care are important in promoting health and wellness. Ask your health care provider about:  The right schedule for you to have regular tests and exams.  Things you can do on your own to prevent diseases and keep yourself healthy.  What should I know about diet, weight, and exercise?  Eat a healthy diet    Eat a diet that includes plenty of vegetables, fruits, low-fat dairy products, and lean protein.  Do not eat a lot of foods that are high in solid fats, added sugars, or sodium.  Maintain a healthy weight  Body mass index (BMI) is a measurement that can be used to identify possible weight problems. It estimates body fat based on height and weight. Your health care provider can help determine your BMI and help you achieve or maintain a healthy weight.  Get regular exercise  Get regular exercise. This is one of the most important things you can do for your health. Most adults should:  Exercise for at least 150 minutes each week. The exercise should increase your heart rate and make you sweat (moderate-intensity exercise).  Do strengthening exercises at least twice a week. This is in addition to the moderate-intensity exercise.  Spend less time sitting. Even light physical activity can be beneficial.  Watch cholesterol and blood lipids  Have your blood tested for lipids and cholesterol at 62 years of age, then have this test every 5 years.  You may need to have your cholesterol levels checked more often if:  Your lipid or cholesterol levels are high.  You are older than 62 years of age.  You are at high risk for heart disease.  What should I know about cancer screening?  Many types of cancers can be detected early and may often be prevented. Depending on your health history and family history, you may need to have cancer screening at various ages. This may include screening for:  Colorectal cancer.  Prostate cancer.  Skin cancer.  Lung  cancer.  What should I know about heart disease, diabetes, and high blood pressure?  Blood pressure and heart disease  High blood pressure causes heart disease and increases the risk of stroke. This is more likely to develop in people who have high blood pressure readings or are overweight.  Talk with your health care provider about your target blood pressure readings.  Have your blood pressure checked:  Every 3-5 years if you are 62-62 years of age.  Every year if you are 3 years old or older.  If you are between the ages of 60 and 72 and are a current or former smoker, ask your health care provider if you should have a one-time screening for abdominal aortic aneurysm (AAA).  Diabetes  Have regular diabetes screenings. This checks your fasting blood sugar level. Have the screening done:  Once every three years after age 62 if you are at a normal weight and have a low risk for diabetes.  More often and at a younger age if you are overweight or have a high risk for diabetes.  What should I know about preventing infection?  Hepatitis B  If you have a higher risk for hepatitis B, you should be screened for this virus. Talk with your health care provider to find out if you are at risk for hepatitis B infection.  Hepatitis C  Blood testing is recommended for:  Everyone born from [birth year] through 1964.  Anyone  with known risk factors for hepatitis C.  Sexually transmitted infections (STIs)  You should be screened each year for STIs, including gonorrhea and chlamydia, if:  You are sexually active and are younger than 62 years of age.  You are older than 62 years of age and your health care provider tells you that you are at risk for this type of infection.  Your sexual activity has changed since you were last screened, and you are at increased risk for chlamydia or gonorrhea. Ask your health care provider if you are at risk.  Ask your health care provider about whether you are at high risk for HIV. Your health care provider  may recommend a prescription medicine to help prevent HIV infection. If you choose to take medicine to prevent HIV, you should first get tested for HIV. You should then be tested every 3 months for as long as you are taking the medicine.  Follow these instructions at home:  Alcohol use  Do not drink alcohol if your health care provider tells you not to drink.  If you drink alcohol:  Limit how much you have to 0-2 drinks a day.  Know how much alcohol is in your drink. In the [place] one drink equals one 12 oz bottle of beer (355 mL), one 5 oz glass of wine (148 mL), or one 1 oz glass of hard liquor (44 mL).  Lifestyle  Do not use any products that contain nicotine or tobacco. These products include cigarettes, chewing tobacco, and vaping devices, such as e-cigarettes. If you need help quitting, ask your health care provider.  Do not use street drugs.  Do not share needles.  Ask your health care provider for help if you need support or information about quitting drugs.  General instructions  Schedule regular health, dental, and eye exams.  Stay current with your vaccines.  Tell your health care provider if:  You often feel depressed.  You have ever been abused or do not feel safe at home.  Summary  Adopting a healthy lifestyle and getting preventive care are important in promoting health and wellness.  Follow your health care provider's instructions about healthy diet, exercising, and getting tested or screened for diseases.  Follow your health care provider's instructions on monitoring your cholesterol and blood pressure.  This information is not intended to replace advice given to you by your health care provider. Make sure you discuss any questions you have with your health care provider.  Document Revised: 09/17/2020 Document Reviewed: 09/17/2020  Elsevier Patient Education  2024 [employer].

## 2024-04-05 NOTE — Progress Notes (Signed)
 Subjective:  Patient ID: Neiman Roots, male    DOB: 02/19/1961  Age: 63 y.o. MRN: [id number]  CC: Annual Exam   HPI Hue Frick presents for a CPX  and f/up ---  Discussed the use of AI scribe software for clinical note transcription with the patient, who gave verbal consent to proceed.  History of Present Illness Rashied Corallo is a 63 year old male who presents with increased anxiety and chest discomfort.  He has experienced increased anxiety, exacerbated by recent family stressors, including his mother's cancer diagnosis and his responsibilities as her power of attorney. This anxiety has led to feelings of being overwhelmed and experiencing tremors. It has also affected his sleep and appetite, resulting in weight loss. Previously, he had gained weight while staying with his mother, but has since lost it due to decreased appetite.  He describes chest discomfort, which he attributes to his asthma. He uses an inhaler and Breo, which have provided relief. No shortness of breath, cough, or symptoms suggestive of cardiac pain, such as heartburn or indigestion. He describes the sensation as a lung-related ache rather than cardiac pain.  He mentions low libido and low energy levels, which have been ongoing issues. His testosterone  level was checked a few months ago and was over 600 with urology.  He is currently taking mirtazapine  for depression, anxiety, and insomnia.    Outpatient Medications Prior to Visit  Medication Sig Dispense Refill   Albuterol -Budesonide  (AIRSUPRA ) 90-80 MCG/ACT AERO Inhale 2 puffs into the lungs 4 (four) times daily as needed. 32.1 g 1   Azelastine -Fluticasone  (DYMISTA ) 137-50 MCG/ACT SUSP Place 2 sprays into both nostrils 2 (two) times daily. 23 g 5   budesonide -formoterol  (SYMBICORT ) 80-4.5 MCG/ACT inhaler Inhale 2 puffs into the lungs 2 (two) times daily. 3 each 1   meclizine  (ANTIVERT ) 12.5 MG tablet Take 1 tablet (12.5 mg total) by mouth 3 (three) times daily as  needed for dizziness. 270 tablet 1   montelukast  (SINGULAIR ) 10 MG tablet TAKE 1 TABLET BY MOUTH EVERY DAY 90 tablet 1   Olopatadine-Mometasone  (RYALTRIS ) 665-25 MCG/ACT SUSP 2 sprays per nostril twice daily 29 g 5   tadalafil (CIALIS) 5 MG tablet Take 5 mg by mouth daily.     valACYclovir (VALTREX) 1000 MG tablet Take 1,000 mg by mouth daily as needed.     mirtazapine  (REMERON ) 15 MG tablet Take 1 tablet (15 mg total) by mouth at bedtime. 30 tablet 0   No facility-administered medications prior to visit.    ROS Review of Systems  Constitutional:  Positive for fatigue. Negative for appetite change, chills, diaphoresis and unexpected weight change.  HENT: Negative.    Eyes: Negative.   Respiratory: Negative.  Negative for cough, chest tightness, shortness of breath and wheezing.   Cardiovascular:  Negative for chest pain, palpitations and leg swelling.  Gastrointestinal: Negative.  Negative for abdominal pain, constipation, diarrhea, nausea and vomiting.  Endocrine: Negative.   Genitourinary: Negative.  Negative for difficulty urinating and dysuria.  Musculoskeletal: Negative.   Skin: Negative.   Neurological:  Positive for tremors. Negative for dizziness.  Hematological:  Negative for adenopathy. Does not bruise/bleed easily.  Psychiatric/Behavioral:  Positive for dysphoric mood and sleep disturbance. Negative for behavioral problems, confusion, decreased concentration, hallucinations, self-injury and suicidal ideas. The patient is nervous/anxious. The patient is not hyperactive.     Objective:  BP 112/78 (BP Location: Left Arm, Patient Position: Sitting, Cuff Size: Normal)   Pulse 67   Temp 97.9 F (36.6  C) (Oral)   Resp 16   Ht 5' 7 (1.702 m)   Wt 138 lb 9.6 oz (62.9 kg)   SpO2 99%   BMI 21.71 kg/m   BP Readings from Last 3 Encounters:  04/05/24 112/78  08/31/23 118/70  07/31/23 (!) 94/59    Wt Readings from Last 3 Encounters:  04/05/24 138 lb 9.6 oz (62.9 kg)   08/31/23 129 lb (58.5 kg)  07/31/23 130 lb (59 kg)    Physical Exam Vitals reviewed.  HENT:     Nose: Nose normal.  Eyes:     General: No scleral icterus.    Conjunctiva/sclera: Conjunctivae normal.  Cardiovascular:     Rate and Rhythm: Regular rhythm. Bradycardia present.     Heart sounds: Normal heart sounds, S1 normal and S2 normal. No murmur heard.    Comments: EKG--- SB, 57 bpm Anterior infarct pattern is not new No LVH Unchanged  Pulmonary:     Effort: Pulmonary effort is normal.     Breath sounds: No stridor. No wheezing, rhonchi or rales.  Abdominal:     General: Abdomen is flat.     Palpations: There is no mass.     Tenderness: There is no abdominal tenderness. There is no guarding.     Hernia: No hernia is present. There is no hernia in the left inguinal area or right inguinal area.  Genitourinary:    Pubic Area: No rash.      Penis: Normal and circumcised.      Testes: Normal.        Right: Mass, tenderness or swelling not present.        Left: Mass, tenderness or swelling not present.     Epididymis:     Right: Normal.     Left: Normal.  Musculoskeletal:     Cervical back: Neck supple.     Right lower leg: No edema.     Left lower leg: No edema.  Lymphadenopathy:     Cervical: No cervical adenopathy.     Lower Body: No right inguinal adenopathy. No left inguinal adenopathy.  Neurological:     Mental Status: He is alert.     Lab Results  Component Value Date   WBC 7.0 08/31/2023   HGB 14.5 08/31/2023   HCT 43.3 08/31/2023   PLT 192.0 08/31/2023   GLUCOSE 95 08/31/2023   CHOL 210 (H) 03/03/2023   TRIG 48.0 03/03/2023   HDL 78.40 03/03/2023   LDLCALC 122 (H) 03/03/2023   ALT 14 03/03/2023   AST 19 03/03/2023   NA 138 08/31/2023   K 4.5 08/31/2023   CL 102 08/31/2023   CREATININE 1.00 08/31/2023   BUN 19 08/31/2023   CO2 31 08/31/2023   TSH 2.86 03/03/2023   PSA 1.56 03/03/2023   HGBA1C 5.7 01/09/2021    [name] Contrast Result  Date: 09/03/2023 CLINICAL DATA:  Provided history: Vertigo. Facial numbness. Numbness or tingling, paresthesia. EXAM: MRI HEAD WITHOUT CONTRAST TECHNIQUE: Multiplanar, multiecho pulse sequences of the brain and surrounding structures were obtained without intravenous contrast. COMPARISON:  Brain MRI 10/03/2014. FINDINGS: A routine protocol non-contrast brain MRI was ordered and performed. Brain: No age-advanced or lobar predominant cerebral atrophy. No cortical encephalomalacia is identified. No significant cerebral white matter disease. There is no acute infarct. No evidence of an intracranial mass. No chronic intracranial blood products. No extra-axial fluid collection. No midline shift. Vascular: Maintained flow voids within the proximal large arterial vessels. Skull and upper cervical spine: No  focal worrisome marrow lesion. Sinuses/Orbits: No mass or acute finding within the imaged orbits. No significant paranasal sinus disease. Other: Trace fluid within right mastoid air cells. IMPRESSION: 1. Unremarkable non-contrast MRI appearance of the brain. No evidence of an intracranial mass or an acute intracranial abnormality. 2. Trace fluid within right mastoid air cells. Electronically Signed   By: Rockey Childs D.O.   On: 09/03/2023 16:16    The 10-year ASCVD risk score (Arnett DK, et al., 2019) is: 6.9%   Values used to calculate the score:     Age: 33 years     Clincally relevant sex: Male     Is Non-Hispanic African American: No     Diabetic: No     Tobacco smoker: No     Systolic Blood Pressure: 112 mmHg     Is BP treated: No     HDL Cholesterol: 68.8 mg/dL     Total Cholesterol: 213 mg/dL    Assessment & Plan:  Encounter for general adult medical examination with abnormal findings- Exam completed, labs reviewed, vaccines reviewed and updated, cancer screenings addressed, pt ed material was given.  -     PSA; Future  Bradycardia on ECG- He is asx. -     TSH; Future  MCI (mild cognitive  impairment) -     [facility] AD-Detect Phosphorylated tau217([id number]), Plasma; Future  Immunization due -     Flu vaccine trivalent PF, 6mos and older(Flulaval,Afluria,Fluarix,Fluzone)  Bradycardia -     EKG 12-Lead  Dyslipidemia, goal LDL below 130- Statin is not indicated. -     Lipid panel; Future -     Hepatic function panel; Future  GAD (generalized anxiety disorder) -     Mirtazapine ; Take 1 tablet (15 mg total) by mouth at bedtime.  Dispense: 90 tablet; Refill: 1     Follow-up: Return in about 6 months (around 10/03/2024).  Debby Molt, MD

## 2024-04-08 LAB — QUEST AD-DETECT PHOSPHORYLATED TAU217(P-TAU217), PLASMA: Quest Detect PTAU217, Plasma: 0.66 pg/mL — ABNORMAL HIGH (ref <=0.15)

## 2024-04-09 ENCOUNTER — Ambulatory Visit: Payer: Self-pay | Admitting: Internal Medicine

## 2024-04-09 ENCOUNTER — Other Ambulatory Visit: Payer: Self-pay | Admitting: Internal Medicine

## 2024-04-09 DIAGNOSIS — G3184 Mild cognitive impairment, so stated: Secondary | ICD-10-CM

## 2024-05-22 ENCOUNTER — Other Ambulatory Visit: Payer: Self-pay | Admitting: Internal Medicine

## 2024-06-21 ENCOUNTER — Ambulatory Visit: Admitting: Internal Medicine

## 2024-08-22 ENCOUNTER — Ambulatory Visit: Admitting: Neurology
